# Patient Record
Sex: Female | Born: 1965
Health system: Southern US, Community
[De-identification: ages and names within clinical notes are randomized; demographics above are authoritative.]

## PROBLEM LIST (undated history)

## (undated) DIAGNOSIS — I059 Rheumatic mitral valve disease, unspecified: Secondary | ICD-10-CM

## (undated) DIAGNOSIS — N760 Acute vaginitis: Secondary | ICD-10-CM

## (undated) DIAGNOSIS — K219 Gastro-esophageal reflux disease without esophagitis: Secondary | ICD-10-CM

## (undated) DIAGNOSIS — K317 Polyp of stomach and duodenum: Secondary | ICD-10-CM

## (undated) DIAGNOSIS — G589 Mononeuropathy, unspecified: Secondary | ICD-10-CM

## (undated) DIAGNOSIS — Z90710 Acquired absence of both cervix and uterus: Secondary | ICD-10-CM

## (undated) DIAGNOSIS — A159 Respiratory tuberculosis unspecified: Secondary | ICD-10-CM

## (undated) DIAGNOSIS — F419 Anxiety disorder, unspecified: Secondary | ICD-10-CM

## (undated) DIAGNOSIS — M549 Dorsalgia, unspecified: Secondary | ICD-10-CM

## (undated) DIAGNOSIS — R079 Chest pain, unspecified: Secondary | ICD-10-CM

## (undated) DIAGNOSIS — E785 Hyperlipidemia, unspecified: Secondary | ICD-10-CM

## (undated) DIAGNOSIS — R000 Tachycardia, unspecified: Secondary | ICD-10-CM

## (undated) DIAGNOSIS — D126 Benign neoplasm of colon, unspecified: Secondary | ICD-10-CM

## (undated) DIAGNOSIS — J019 Acute sinusitis, unspecified: Secondary | ICD-10-CM

## (undated) DIAGNOSIS — J189 Pneumonia, unspecified organism: Secondary | ICD-10-CM

## (undated) DIAGNOSIS — K589 Irritable bowel syndrome without diarrhea: Secondary | ICD-10-CM

## (undated) DIAGNOSIS — F329 Major depressive disorder, single episode, unspecified: Secondary | ICD-10-CM

## (undated) DIAGNOSIS — M479 Spondylosis, unspecified: Secondary | ICD-10-CM

## (undated) DIAGNOSIS — K2270 Barrett's esophagus without dysplasia: Secondary | ICD-10-CM

## (undated) DIAGNOSIS — N83201 Unspecified ovarian cyst, right side: Secondary | ICD-10-CM

## (undated) DIAGNOSIS — F32A Depression, unspecified: Secondary | ICD-10-CM

## (undated) HISTORY — DX: Acute sinusitis, unspecified: J01.90

## (undated) HISTORY — PX: ABDOMINAL HYSTERECTOMY: SHX81

## (undated) HISTORY — PX: TONSILLECTOMY: SUR1361

## (undated) HISTORY — DX: Rheumatic mitral valve disease, unspecified: I05.9

## (undated) HISTORY — DX: Irritable bowel syndrome without diarrhea: K58.9

## (undated) HISTORY — DX: Barrett's esophagus without dysplasia: K22.70

## (undated) HISTORY — DX: Chest pain, unspecified: R07.9

## (undated) HISTORY — DX: Unspecified ovarian cyst, right side: N83.201

## (undated) HISTORY — DX: Benign neoplasm of colon, unspecified: D12.6

## (undated) HISTORY — DX: Spondylosis, unspecified: M47.9

## (undated) HISTORY — DX: Polyp of stomach and duodenum: K31.7

## (undated) HISTORY — PX: CHOLECYSTECTOMY: SHX55

## (undated) HISTORY — PX: TUBAL LIGATION: SHX77

## (undated) HISTORY — DX: Dorsalgia, unspecified: M54.9

## (undated) HISTORY — DX: Tachycardia, unspecified: R00.0

## (undated) HISTORY — DX: Gastro-esophageal reflux disease without esophagitis: K21.9

## (undated) HISTORY — DX: Hyperlipidemia, unspecified: E78.5

## (undated) HISTORY — DX: Acute vaginitis: N76.0

## (undated) HISTORY — PX: OTHER SURGICAL HISTORY: SHX169

---

## 1997-12-20 ENCOUNTER — Other Ambulatory Visit: Admission: RE | Admit: 1997-12-20 | Discharge: 1997-12-20 | Payer: Self-pay | Admitting: Family Medicine

## 2000-06-12 ENCOUNTER — Encounter: Admission: RE | Admit: 2000-06-12 | Discharge: 2000-06-12 | Payer: Self-pay | Admitting: Obstetrics

## 2000-06-12 ENCOUNTER — Encounter: Payer: Self-pay | Admitting: Obstetrics

## 2000-06-27 ENCOUNTER — Emergency Department (HOSPITAL_COMMUNITY): Admission: EM | Admit: 2000-06-27 | Discharge: 2000-06-27 | Payer: Self-pay | Admitting: Emergency Medicine

## 2000-09-05 ENCOUNTER — Encounter: Payer: Self-pay | Admitting: Emergency Medicine

## 2000-09-05 ENCOUNTER — Emergency Department (HOSPITAL_COMMUNITY): Admission: EM | Admit: 2000-09-05 | Discharge: 2000-09-05 | Payer: Self-pay | Admitting: Emergency Medicine

## 2005-06-25 LAB — HM MAMMOGRAPHY: HM Mammogram: NORMAL

## 2006-06-11 ENCOUNTER — Ambulatory Visit: Payer: Self-pay | Admitting: Internal Medicine

## 2006-06-11 LAB — CONVERTED CEMR LAB
ALT: 28 units/L (ref 0–40)
AST: 20 units/L (ref 0–37)
Albumin: 3.9 g/dL (ref 3.5–5.2)
Alkaline Phosphatase: 47 units/L (ref 39–117)
BUN: 10 mg/dL (ref 6–23)
Basophils Absolute: 0 10*3/uL (ref 0.0–0.1)
Basophils Relative: 0 % (ref 0.0–1.0)
Bilirubin Urine: NEGATIVE
CO2: 26 meq/L (ref 19–32)
Calcium: 9 mg/dL (ref 8.4–10.5)
Chloride: 108 meq/L (ref 96–112)
Chol/HDL Ratio, serum: 6.7
Cholesterol: 225 mg/dL (ref 0–200)
Creatinine, Ser: 1.1 mg/dL (ref 0.4–1.2)
Eosinophil percent: 1.6 % (ref 0.0–5.0)
GFR calc non Af Amer: 58 mL/min
Glomerular Filtration Rate, Af Am: 70 mL/min/{1.73_m2}
Glucose, Bld: 97 mg/dL (ref 70–99)
HCT: 45.8 % (ref 36.0–46.0)
HDL: 33.6 mg/dL — ABNORMAL LOW (ref >39.0)
Hemoglobin: 15.4 g/dL — ABNORMAL HIGH (ref 12.0–15.0)
Ketones, ur: NEGATIVE mg/dL
LDL DIRECT: 171.9 mg/dL
Leukocytes, UA: NEGATIVE
Lymphocytes Relative: 32.8 % (ref 12.0–46.0)
MCHC: 33.6 g/dL (ref 30.0–36.0)
MCV: 88.2 fL (ref 78.0–100.0)
Monocytes Absolute: 0.7 10*3/uL (ref 0.2–0.7)
Monocytes Relative: 7 % (ref 3.0–11.0)
Neutro Abs: 5.9 10*3/uL (ref 1.4–7.7)
Neutrophils Relative %: 58.6 % (ref 43.0–77.0)
Nitrite: NEGATIVE
Platelets: 314 10*3/uL (ref 150–400)
Potassium: 4 meq/L (ref 3.5–5.1)
RBC: 5.19 M/uL — ABNORMAL HIGH (ref 3.87–5.11)
RDW: 12.1 % (ref 11.5–14.6)
Sodium: 139 meq/L (ref 135–145)
Specific Gravity, Urine: 1.015 (ref 1.000–1.03)
TSH: 1.69 microintl units/mL (ref 0.35–5.50)
Total Bilirubin: 0.7 mg/dL (ref 0.3–1.2)
Total Protein, Urine: NEGATIVE mg/dL
Total Protein: 7.1 g/dL (ref 6.0–8.3)
Triglyceride fasting, serum: 79 mg/dL (ref 0–149)
Urine Glucose: NEGATIVE mg/dL
Urobilinogen, UA: 2 (ref 0.0–1.0)
VLDL: 16 mg/dL (ref 0–40)
WBC: 10.1 10*3/uL (ref 4.5–10.5)
pH: 7.5 (ref 5.0–8.0)

## 2006-07-31 ENCOUNTER — Ambulatory Visit: Payer: Self-pay | Admitting: Internal Medicine

## 2006-07-31 LAB — CONVERTED CEMR LAB
ALT: 26 units/L (ref 0–40)
Bilirubin, Direct: 0.2 mg/dL (ref 0.0–0.3)
Chol/HDL Ratio, serum: 3.7
Cholesterol: 134 mg/dL (ref 0–200)
HDL: 36 mg/dL — ABNORMAL LOW (ref >39.0)
LDL Cholesterol: 81 mg/dL (ref 0–99)
Total CK: 115 units/L (ref 7–177)
Total Protein: 7.1 g/dL (ref 6.0–8.3)
VLDL: 17 mg/dL (ref 0–40)

## 2006-09-25 ENCOUNTER — Emergency Department (HOSPITAL_COMMUNITY): Admission: EM | Admit: 2006-09-25 | Discharge: 2006-09-25 | Payer: Self-pay | Admitting: Emergency Medicine

## 2006-10-27 ENCOUNTER — Ambulatory Visit (HOSPITAL_COMMUNITY): Admission: RE | Admit: 2006-10-27 | Discharge: 2006-10-27 | Payer: Self-pay | Admitting: Gastroenterology

## 2007-07-02 ENCOUNTER — Ambulatory Visit: Payer: Self-pay | Admitting: Internal Medicine

## 2007-07-05 LAB — CONVERTED CEMR LAB
ALT: 23 units/L (ref 0–35)
AST: 22 units/L (ref 0–37)
Alkaline Phosphatase: 49 units/L (ref 39–117)
BUN: 8 mg/dL (ref 6–23)
Basophils Absolute: 0.1 10*3/uL (ref 0.0–0.1)
Bilirubin Urine: NEGATIVE
Bilirubin, Direct: 0.2 mg/dL (ref 0.0–0.3)
Calcium: 9.5 mg/dL (ref 8.4–10.5)
Chloride: 106 meq/L (ref 96–112)
Cholesterol: 150 mg/dL (ref 0–200)
Crystals: NEGATIVE
GFR calc non Af Amer: 73 mL/min
Glucose, Bld: 91 mg/dL (ref 70–99)
HCT: 46.4 % — ABNORMAL HIGH (ref 36.0–46.0)
HDL: 35.7 mg/dL — ABNORMAL LOW (ref >39.0)
Hemoglobin: 15.8 g/dL — ABNORMAL HIGH (ref 12.0–15.0)
Leukocytes, UA: NEGATIVE
Lymphocytes Relative: 30.1 % (ref 12.0–46.0)
MCHC: 34.2 g/dL (ref 30.0–36.0)
MCV: 89.8 fL (ref 78.0–100.0)
Monocytes Absolute: 1 10*3/uL — ABNORMAL HIGH (ref 0.2–0.7)
Monocytes Relative: 8.4 % (ref 3.0–11.0)
Mucus, UA: NEGATIVE
Neutro Abs: 6.8 10*3/uL (ref 1.4–7.7)
Neutrophils Relative %: 60.2 % (ref 43.0–77.0)
RDW: 11.9 % (ref 11.5–14.6)
Triglycerides: 95 mg/dL (ref 0–149)
Urobilinogen, UA: 4 — AB (ref 0.0–1.0)
pH: 7 (ref 5.0–8.0)

## 2007-07-22 DIAGNOSIS — E785 Hyperlipidemia, unspecified: Secondary | ICD-10-CM

## 2007-07-22 DIAGNOSIS — I059 Rheumatic mitral valve disease, unspecified: Secondary | ICD-10-CM | POA: Insufficient documentation

## 2007-07-22 DIAGNOSIS — K219 Gastro-esophageal reflux disease without esophagitis: Secondary | ICD-10-CM | POA: Insufficient documentation

## 2007-07-22 HISTORY — DX: Rheumatic mitral valve disease, unspecified: I05.9

## 2007-07-22 HISTORY — DX: Hyperlipidemia, unspecified: E78.5

## 2007-07-22 HISTORY — DX: Gastro-esophageal reflux disease without esophagitis: K21.9

## 2007-07-23 ENCOUNTER — Ambulatory Visit: Payer: Self-pay | Admitting: Internal Medicine

## 2007-07-23 DIAGNOSIS — M479 Spondylosis, unspecified: Secondary | ICD-10-CM

## 2007-07-23 DIAGNOSIS — K589 Irritable bowel syndrome without diarrhea: Secondary | ICD-10-CM | POA: Insufficient documentation

## 2007-07-23 DIAGNOSIS — M549 Dorsalgia, unspecified: Secondary | ICD-10-CM | POA: Insufficient documentation

## 2007-07-23 HISTORY — DX: Dorsalgia, unspecified: M54.9

## 2007-07-23 HISTORY — DX: Spondylosis, unspecified: M47.9

## 2007-07-23 HISTORY — DX: Irritable bowel syndrome, unspecified: K58.9

## 2007-08-27 ENCOUNTER — Telehealth (INDEPENDENT_AMBULATORY_CARE_PROVIDER_SITE_OTHER): Payer: Self-pay | Admitting: *Deleted

## 2007-09-09 ENCOUNTER — Ambulatory Visit: Payer: Self-pay | Admitting: Internal Medicine

## 2007-09-09 DIAGNOSIS — J019 Acute sinusitis, unspecified: Secondary | ICD-10-CM

## 2007-09-09 HISTORY — DX: Acute sinusitis, unspecified: J01.90

## 2007-09-13 ENCOUNTER — Telehealth (INDEPENDENT_AMBULATORY_CARE_PROVIDER_SITE_OTHER): Payer: Self-pay | Admitting: *Deleted

## 2007-10-25 LAB — CONVERTED CEMR LAB: Pap Smear: NORMAL

## 2007-10-28 ENCOUNTER — Ambulatory Visit: Payer: Self-pay | Admitting: Internal Medicine

## 2007-12-17 ENCOUNTER — Ambulatory Visit: Payer: Self-pay | Admitting: Internal Medicine

## 2007-12-17 DIAGNOSIS — N760 Acute vaginitis: Secondary | ICD-10-CM

## 2007-12-17 HISTORY — DX: Acute vaginitis: N76.0

## 2007-12-28 ENCOUNTER — Telehealth (INDEPENDENT_AMBULATORY_CARE_PROVIDER_SITE_OTHER): Payer: Self-pay | Admitting: *Deleted

## 2008-01-11 ENCOUNTER — Telehealth (INDEPENDENT_AMBULATORY_CARE_PROVIDER_SITE_OTHER): Payer: Self-pay | Admitting: *Deleted

## 2008-01-31 ENCOUNTER — Ambulatory Visit (HOSPITAL_COMMUNITY): Admission: RE | Admit: 2008-01-31 | Discharge: 2008-01-31 | Payer: Self-pay | Admitting: Obstetrics and Gynecology

## 2008-07-18 ENCOUNTER — Ambulatory Visit: Payer: Self-pay | Admitting: Internal Medicine

## 2008-07-18 LAB — CONVERTED CEMR LAB
ALT: 19 units/L (ref 0–35)
Alkaline Phosphatase: 42 units/L (ref 39–117)
Basophils Absolute: 0.1 10*3/uL (ref 0.0–0.1)
Bilirubin Urine: NEGATIVE
Bilirubin, Direct: 0.1 mg/dL (ref 0.0–0.3)
CO2: 29 meq/L (ref 19–32)
Calcium: 9.4 mg/dL (ref 8.4–10.5)
Cholesterol: 212 mg/dL (ref 0–200)
GFR calc Af Amer: 117 mL/min
Glucose, Bld: 88 mg/dL (ref 70–99)
HDL: 41.1 mg/dL (ref >39.0)
Hemoglobin, Urine: NEGATIVE
Ketones, ur: NEGATIVE mg/dL
Leukocytes, UA: NEGATIVE
Lymphocytes Relative: 36 % (ref 12.0–46.0)
MCHC: 35.4 g/dL (ref 30.0–36.0)
Monocytes Relative: 9 % (ref 3.0–12.0)
Neutro Abs: 5 10*3/uL (ref 1.4–7.7)
Nitrite: NEGATIVE
Platelets: 264 10*3/uL (ref 150–400)
Potassium: 4 meq/L (ref 3.5–5.1)
RDW: 11.6 % (ref 11.5–14.6)
Sodium: 139 meq/L (ref 135–145)
TSH: 1.85 microintl units/mL (ref 0.35–5.50)
Total Bilirubin: 1 mg/dL (ref 0.3–1.2)
Total CHOL/HDL Ratio: 5.2
Total Protein: 7.1 g/dL (ref 6.0–8.3)
Triglycerides: 114 mg/dL (ref 0–149)
Urobilinogen, UA: 0.2 (ref 0.0–1.0)
VLDL: 23 mg/dL (ref 0–40)
pH: 7.5 (ref 5.0–8.0)

## 2008-07-25 ENCOUNTER — Ambulatory Visit: Payer: Self-pay | Admitting: Internal Medicine

## 2008-07-28 ENCOUNTER — Telehealth (INDEPENDENT_AMBULATORY_CARE_PROVIDER_SITE_OTHER): Payer: Self-pay | Admitting: *Deleted

## 2008-12-14 ENCOUNTER — Ambulatory Visit: Payer: Self-pay | Admitting: Internal Medicine

## 2008-12-14 LAB — CONVERTED CEMR LAB
ALT: 20 units/L (ref 0–35)
AST: 21 units/L (ref 0–37)
Albumin: 3.8 g/dL (ref 3.5–5.2)
HDL: 35.9 mg/dL — ABNORMAL LOW (ref >39.00)
Total Bilirubin: 0.7 mg/dL (ref 0.3–1.2)
Triglycerides: 66 mg/dL (ref 0.0–149.0)
VLDL: 13.2 mg/dL (ref 0.0–40.0)

## 2009-01-25 ENCOUNTER — Ambulatory Visit: Payer: Self-pay | Admitting: Internal Medicine

## 2009-07-23 ENCOUNTER — Ambulatory Visit: Payer: Self-pay | Admitting: Internal Medicine

## 2009-07-23 LAB — CONVERTED CEMR LAB
ALT: 19 units/L (ref 0–35)
Bilirubin Urine: NEGATIVE
Calcium: 9.2 mg/dL (ref 8.4–10.5)
Cholesterol: 142 mg/dL (ref 0–200)
Eosinophils Absolute: 0.2 10*3/uL (ref 0.0–0.7)
Eosinophils Relative: 2 % (ref 0.0–5.0)
GFR calc non Af Amer: 82.76 mL/min (ref >60)
HCT: 42 % (ref 36.0–46.0)
HDL: 39 mg/dL — ABNORMAL LOW (ref >39.00)
Leukocytes, UA: NEGATIVE
Lymphs Abs: 3.2 10*3/uL (ref 0.7–4.0)
MCHC: 34 g/dL (ref 30.0–36.0)
MCV: 92 fL (ref 78.0–100.0)
Monocytes Absolute: 0.8 10*3/uL (ref 0.1–1.0)
Neutrophils Relative %: 56.6 % (ref 43.0–77.0)
Nitrite: NEGATIVE
Platelets: 231 10*3/uL (ref 150.0–400.0)
Potassium: 4.1 meq/L (ref 3.5–5.1)
RDW: 12.1 % (ref 11.5–14.6)
Sodium: 139 meq/L (ref 135–145)
TSH: 2.24 microintl units/mL (ref 0.35–5.50)
Total Bilirubin: 0.9 mg/dL (ref 0.3–1.2)
Triglycerides: 82 mg/dL (ref 0.0–149.0)
VLDL: 16.4 mg/dL (ref 0.0–40.0)
pH: 6.5 (ref 5.0–8.0)

## 2009-08-02 ENCOUNTER — Ambulatory Visit: Payer: Self-pay | Admitting: Internal Medicine

## 2010-08-20 ENCOUNTER — Telehealth: Payer: Self-pay | Admitting: Internal Medicine

## 2010-08-20 DIAGNOSIS — R000 Tachycardia, unspecified: Secondary | ICD-10-CM | POA: Insufficient documentation

## 2010-08-20 HISTORY — DX: Tachycardia, unspecified: R00.0

## 2010-08-21 ENCOUNTER — Encounter (INDEPENDENT_AMBULATORY_CARE_PROVIDER_SITE_OTHER): Payer: Self-pay | Admitting: *Deleted

## 2010-09-04 ENCOUNTER — Ambulatory Visit
Admission: RE | Admit: 2010-09-04 | Discharge: 2010-09-04 | Payer: Self-pay | Source: Home / Self Care | Attending: Cardiology | Admitting: Cardiology

## 2010-09-04 ENCOUNTER — Ambulatory Visit: Admission: RE | Admit: 2010-09-04 | Discharge: 2010-09-04 | Payer: Self-pay | Source: Home / Self Care

## 2010-09-04 ENCOUNTER — Encounter: Payer: Self-pay | Admitting: Cardiology

## 2010-09-04 DIAGNOSIS — R079 Chest pain, unspecified: Secondary | ICD-10-CM | POA: Insufficient documentation

## 2010-09-04 DIAGNOSIS — R002 Palpitations: Secondary | ICD-10-CM

## 2010-09-04 HISTORY — DX: Chest pain, unspecified: R07.9

## 2010-09-23 ENCOUNTER — Ambulatory Visit: Admission: RE | Admit: 2010-09-23 | Discharge: 2010-09-23 | Payer: Self-pay | Source: Home / Self Care

## 2010-09-23 ENCOUNTER — Ambulatory Visit (HOSPITAL_COMMUNITY)
Admission: RE | Admit: 2010-09-23 | Discharge: 2010-09-23 | Payer: Self-pay | Source: Home / Self Care | Attending: Cardiology | Admitting: Cardiology

## 2010-09-23 ENCOUNTER — Ambulatory Visit
Admission: RE | Admit: 2010-09-23 | Discharge: 2010-09-23 | Payer: Self-pay | Source: Home / Self Care | Attending: Cardiology | Admitting: Cardiology

## 2010-09-24 NOTE — Assessment & Plan Note (Signed)
Summary: FU-$50-STC   Vital Signs:  Patient Profile:   45 Years Old Female Weight:      201 pounds Temp:     99.8 degrees F oral Pulse rate:   75 / minute BP sitting:   121 / 82  (right arm) Cuff size:   regular  Pt. in pain?   no  Vitals Entered By: Maris Berger (December 17, 2007 10:26 AM)                  Chief Complaint:  keeps getting yeast infections.  History of Present Illness: had last pap with recurrent yeast infections lab proven, HIV neg, now with recurrent infections - she wants to know if there is different tx other than single diflucan pills; d/c is different this time - more clearish but "sticky" and fishy smell    Updated Prior Medication List: SIMVASTATIN 40 MG TABS (SIMVASTATIN) Take 1 tablet by mouth once a day OMEPRAZOLE 20 MG  CPDR (OMEPRAZOLE) 1 by mouth qd ECOTRIN LOW STRENGTH 81 MG  TBEC (ASPIRIN) 1 by mouth qd VICODIN 5-500 MG  TABS (HYDROCODONE-ACETAMINOPHEN) q4h as needed pain METRONIDAZOLE 250 MG  TABS (METRONIDAZOLE) 1 by mouth qid  Current Allergies (reviewed today): ! PENICILLIN ! TETRACYCLINE  Past Medical History:    Reviewed history from 07/23/2007 and no changes required:       GERD       Hyperlipidemia       mitral valve prolapse       IBS       gastric polyp x3 - benign - dr Randa Evens?GI - egd 6/08       Lumbar djd  Past Surgical History:    Reviewed history from 07/22/2007 and no changes required:       Hysterectomy       Tubal ligation       Cholecystectomy       Tonsillectomy       s/p RLE tib/fib fx/surgury   Social History:    Reviewed history from 07/22/2007 and no changes required:       Never Smoked       Alcohol use-no     Physical Exam  General:     Well-developed,well-nourished,in no acute distress; alert,appropriate and cooperative throughout examination Head:     Normocephalic and atraumatic without obvious abnormalities. No apparent alopecia or balding. Eyes:     No corneal or conjunctival  inflammation noted. EOMI. Perrla. Ears:     External ear exam shows no significant lesions or deformities.  Otoscopic examination reveals clear canals, tympanic membranes are intact bilaterally without bulging, retraction, inflammation or discharge. Hearing is grossly normal bilaterally. Nose:     External nasal examination shows no deformity or inflammation. Nasal mucosa are pink and moist without lesions or exudates. Mouth:     Oral mucosa and oropharynx without lesions or exudates.  Teeth in good repair. Neck:     No deformities, masses, or tenderness noted. Lungs:     Normal respiratory effort, chest expands symmetrically. Lungs are clear to auscultation, no crackles or wheezes. Heart:     Normal rate and regular rhythm. S1 and S2 normal without gallop, murmur, click, rub or other extra sounds. Genitalia:     deferred per pt Extremities:     no edema, no ulcers     Impression & Recommendations:  Problem # 1:  VAGINITIS (ICD-616.10)  The following medications were removed from the medication list:    Azithromycin 250 Mg  Tabs (Azithromycin) .Marland Kitchen... 2po qd for 1 day, then 1po qd for 4days, then stop  Her updated medication list for this problem includes:    Metronidazole 250 Mg Tabs (Metronidazole) .Marland Kitchen... 1 by mouth qid tx with above for suspected bact vaginosis, consider nizoral extended course if not improved in 5 to 7 days  Complete Medication List: 1)  Simvastatin 40 Mg Tabs (Simvastatin) .... Take 1 tablet by mouth once a day 2)  Omeprazole 20 Mg Cpdr (Omeprazole) .Marland Kitchen.. 1 by mouth qd 3)  Ecotrin Low Strength 81 Mg Tbec (Aspirin) .Marland Kitchen.. 1 by mouth qd 4)  Vicodin 5-500 Mg Tabs (Hydrocodone-acetaminophen) .... Q4h as needed pain 5)  Metronidazole 250 Mg Tabs (Metronidazole) .Marland Kitchen.. 1 by mouth qid   Patient Instructions: 1)  try the generic flagyl  for one week 2)  if not improved, call and we will consider extended course nizoral for yeast infection 3)  you can also use Eucerin  cream (OTC) or lac-hydrin OTC for dry skin 4)  Please schedule a follow-up appointment as needed.    Prescriptions: METRONIDAZOLE 250 MG  TABS (METRONIDAZOLE) 1 by mouth qid  #40 x 0   Entered and Authorized by:   Corwin Levins MD   Signed by:   Corwin Levins MD on 12/17/2007   Method used:   Print then Give to Patient   RxID:   [id number]  ]

## 2010-09-25 ENCOUNTER — Telehealth: Payer: Self-pay | Admitting: Cardiology

## 2010-09-26 NOTE — Letter (Signed)
Summary: [facility] Consult Scheduled Letter  [facility]  [address] [place], [place] [address]   Phone: [phone]  Fax: [phone]      08/21/2010 MRN: [id number]  Pamela Gibson [address] [place], [place]  [address]    Dear Ms. Snavely,      We have scheduled an appointment for you. At the recommendation of Dr.John, we have scheduled you a consult with [facility])Dr.Hochrein on January 11,2012 at 11:00am .Their phone number is [phone].  If this appointment day and time is not convenient for you, please feel free to call the office of the doctor you are being referred to at the number listed above and reschedule the appointment.  [facility] 3rd floor [address] [place] [address]  Thank you,  Patient Care Coordinator [facility]

## 2010-09-26 NOTE — Progress Notes (Signed)
Summary: referral  Phone Note Call from Patient   Caller: Patient [phone] Summary of Call: Pt called requesting referral to Cardiology, Hx of Mitral Valve Prolapse Initial call taken by: Margaret Pyle, CMA,  August 20, 2010 2:21 PM  Follow-up for Phone Call        this does not normally require referral to cardiology;  is there some symptoms or problem she is having?  might need to consider OV Follow-up by: Corwin Levins MD,  August 20, 2010 3:23 PM  Additional Follow-up for Phone Call Additional follow up Details #1::        Pt states she was seen at [facility] for tachycardia and advised to have eval at Cardiology but referral needed to come from [facility]. Additional Follow-up by: Margaret Pyle, CMA,  August 20, 2010 4:38 PM  New Problems: TACHYCARDIA (ICD-785.0)   Additional Follow-up for Phone Call Additional follow up Details #2::    ok for referral - done per emr Follow-up by: Corwin Levins MD,  August 20, 2010 5:10 PM  Additional Follow-up for Phone Call Additional follow up Details #3:: Details for Additional Follow-up Action Taken: Pt informed  Additional Follow-up by: Lamar Sprinkles, CMA,  August 20, 2010 5:57 PM  New Problems: TACHYCARDIA (ICD-785.0)

## 2010-09-26 NOTE — Assessment & Plan Note (Signed)
Summary: np6. tackycardia. pt has bcbs. gd   Visit Type:  Initial Consult Primary Provider:  Corwin Levins MD  CC:  Palpitatoins and Chest pain.  History of Present Illness: The patient presents for evaluation of the above. She has been having tachycardia palpitations happen several weeks. It seems to happen every other day. It may happen a couple of times per day. She feels her heart racing for several seconds at a time. She cannot bring this on. She doesn't exercise routinely though she does her activities of daily living without symptoms. She denies presyncope or syncope. She denies any shortness of breath, PND or orthopnea. Of note she was told she had mitral valve prolapse in the past but has not had echo in greater than 20 years. She does describe some chest heaviness. This happens randomly. She did not describe associated jaw or arm discomfort. She does not have associated symptoms such as nausea vomiting or diaphoresis. She did have arm discomfort some years ago and reports a negative exercise treadmill test.  Current Medications (verified): 1)  Pravachol 40 Mg Tabs (Pravastatin Sodium) .Marland Kitchen.. 1po Once Daily 2)  Omeprazole 20 Mg  Cpdr (Omeprazole) .Marland Kitchen.. 1 By Mouth Once Daily 3)  Ecotrin Low Strength 81 Mg  Tbec (Aspirin) .Marland Kitchen.. 1 By Mouth Qd 4)  Vicodin 5-500 Mg  Tabs (Hydrocodone-Acetaminophen) .... Q4h As Needed Pain  Allergies (verified): 1)  ! Penicillin 2)  ! 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 Otherwise as stated in the HPI and negative for all other systems.  Vital Signs:  Patient profile:   45 year old female Height:      66 inches Weight:      198 pounds BMI:     32.07 Pulse rate:   75 / minute Resp:     16 per minute BP sitting:   124 / 82  (right arm)  Vitals Entered By: Marrion Coy, CNA (September 04, 2010 11:03 AM)  Physical Exam  General:  Well developed, well nourished, in no acute distress. Head:  normocephalic and atraumatic Eyes:  PERRLA/EOM intact; conjunctiva and lids normal. Mouth:  Teeth, gums and palate normal. Oral mucosa normal. Neck:  Neck supple, no JVD. No masses, thyromegaly or abnormal cervical nodes. Chest Wall:  no deformities or breast masses noted Lungs:  Clear bilaterally to auscultation and percussion. Abdomen:  Bowel sounds positive; abdomen soft and non-tender without masses, organomegaly, or hernias noted. No hepatosplenomegaly. Msk:  Back normal, normal gait. Muscle strength and tone normal. Extremities:  No clubbing or cyanosis. Neurologic:  Alert and oriented x 3. Skin:  Intact without lesions or rashes. Cervical Nodes:  no significant adenopathy Axillary Nodes:  no significant adenopathy Inguinal Nodes:  no significant adenopathy Psych:  Normal affect.   Detailed Cardiovascular Exam  Neck    Carotids: Carotids full and equal bilaterally without bruits.      Neck Veins: Normal, no JVD.    Heart  Inspection: no deformities or lifts noted.      Palpation: normal PMI with no thrills palpable.      Auscultation: regular rate and rhythm, S1, S2 without murmurs, rubs, gallops.  Soft midsystolic click.  Vascular    Abdominal Aorta: no palpable masses, pulsations, or audible bruits.      Femoral Pulses: normal femoral pulses bilaterally.      Pedal Pulses: normal pedal pulses bilaterally.      Radial Pulses: normal radial pulses  bilaterally.      Peripheral Circulation: no clubbing, cyanosis, or edema noted with normal capillary refill.     EKG  Procedure date:  09/04/2010  Findings:      Sinus rhythm, rate 76, right axis deviation, no acute ST-T wave changes  Impression & Recommendations:  Problem # 1:  TACHYCARDIA (ICD-785.0) Predominant complaint is palpitations. I will have the patient wear a 21 day event monitor.   Orders: EKG w/ Interpretation (93000) Event (Event)  Problem # 2:  CHEST PAIN (ICD-786.50) Patient describes chest pressure intermittent. Her pretest probability is somewhat low. However screening with exercise treadmill testing as indicated. This will also allow me to risk stratify and prescribe exercise. Orders: Treadmill (Treadmill)  Problem # 3:  MITRAL VALVE PROLAPSE (ICD-424.0) I will followup with an echocardiogram to exclude significant prolapse or regurgitation requiring further followup for management. Orders: Echocardiogram (Echo)  Patient Instructions: 1)  Your physician recommends that you continue on your current medications as directed. Please refer to the Current Medication list given to you today. 2)  Your physician has requested that you have an echocardiogram.  Echocardiography is a painless test that uses sound waves to create images of your heart. It provides your doctor with information about the size and shape of your heart and how well your heart's chambers and valves are working.  This procedure takes approximately one hour. There are no restrictions for this procedure. 3)  Your physician has recommended that you wear an event monitor.  Event monitors are medical devices that record the heart's electrical activity. Doctors most often use these monitors to diagnose arrhythmias. Arrhythmias are problems with the speed or rhythm of the heartbeat. The monitor is a small, portable device. You can wear one while you do your normal daily activities. This is usually used to  diagnose what is causing palpitations/syncope (passing out). 4)  Your physician has requested that you have an exercise tolerance test.  For further information please visit [email or web address]  Please also follow instruction sheet, as given.

## 2010-10-02 NOTE — Progress Notes (Signed)
Summary: Echo results  Phone Note Call from Patient Call back at [phone]   Summary of Call: Echo results Initial call taken by: Judie Grieve,  September 25, 2010 4:03 PM  Follow-up for Phone Call        Explained to patient that Dr. Antoine Poche has not yet reviewed her echo results & we would call her as soon as he does. Whitney Maeola Sarah RN  September 25, 2010 4:09 PM  Follow-up by: Whitney Maeola Sarah RN,  September 25, 2010 4:09 PM  Additional Follow-up for Phone Call Additional follow up Details #1::        Echo normal. Additional Follow-up by: Rollene Rotunda, MD, [facility],  September 26, 2010 10:29 AM    Additional Follow-up for Phone Call Additional follow up Details #2::    pt aware of normal results Follow-up by: Charolotte Capuchin, RN,  September 26, 2010 10:45 AM

## 2010-12-03 ENCOUNTER — Other Ambulatory Visit: Payer: Self-pay | Admitting: Internal Medicine

## 2010-12-03 ENCOUNTER — Other Ambulatory Visit (INDEPENDENT_AMBULATORY_CARE_PROVIDER_SITE_OTHER): Payer: BC Managed Care – PPO

## 2010-12-03 ENCOUNTER — Other Ambulatory Visit (INDEPENDENT_AMBULATORY_CARE_PROVIDER_SITE_OTHER): Payer: BC Managed Care – PPO | Admitting: Internal Medicine

## 2010-12-03 DIAGNOSIS — Z0000 Encounter for general adult medical examination without abnormal findings: Secondary | ICD-10-CM

## 2010-12-03 LAB — URINALYSIS, ROUTINE W REFLEX MICROSCOPIC
Ketones, ur: NEGATIVE
Specific Gravity, Urine: 1.01 (ref 1.000–1.030)
Total Protein, Urine: NEGATIVE
Urine Glucose: NEGATIVE
pH: 6 (ref 5.0–8.0)

## 2010-12-03 LAB — HEPATIC FUNCTION PANEL
ALT: 28 U/L (ref 0–35)
AST: 22 U/L (ref 0–37)
Albumin: 4 g/dL (ref 3.5–5.2)
Alkaline Phosphatase: 44 U/L (ref 39–117)
Total Protein: 7 g/dL (ref 6.0–8.3)

## 2010-12-03 LAB — CBC WITH DIFFERENTIAL/PLATELET
Basophils Absolute: 0 10*3/uL (ref 0.0–0.1)
Eosinophils Absolute: 0.2 10*3/uL (ref 0.0–0.7)
Lymphocytes Relative: 33.6 % (ref 12.0–46.0)
MCHC: 34.9 g/dL (ref 30.0–36.0)
Neutro Abs: 6.3 10*3/uL (ref 1.4–7.7)
Neutrophils Relative %: 56.4 % (ref 43.0–77.0)
Platelets: 272 10*3/uL (ref 150.0–400.0)
RDW: 13.2 % (ref 11.5–14.6)

## 2010-12-03 LAB — BASIC METABOLIC PANEL
Calcium: 9.4 mg/dL (ref 8.4–10.5)
Chloride: 103 mEq/L (ref 96–112)
Creatinine, Ser: 0.9 mg/dL (ref 0.4–1.2)
Sodium: 139 mEq/L (ref 135–145)

## 2010-12-03 LAB — LIPID PANEL
Cholesterol: 177 mg/dL (ref 0–200)
Triglycerides: 153 mg/dL — ABNORMAL HIGH (ref 0.0–149.0)

## 2010-12-08 ENCOUNTER — Encounter: Payer: Self-pay | Admitting: Internal Medicine

## 2010-12-08 DIAGNOSIS — Z0000 Encounter for general adult medical examination without abnormal findings: Secondary | ICD-10-CM

## 2010-12-08 DIAGNOSIS — Z0001 Encounter for general adult medical examination with abnormal findings: Secondary | ICD-10-CM | POA: Insufficient documentation

## 2010-12-11 ENCOUNTER — Ambulatory Visit (INDEPENDENT_AMBULATORY_CARE_PROVIDER_SITE_OTHER): Payer: BC Managed Care – PPO | Admitting: Internal Medicine

## 2010-12-11 ENCOUNTER — Encounter: Payer: Self-pay | Admitting: Internal Medicine

## 2010-12-11 VITALS — BP 102/60 | HR 72 | Temp 98.9°F | Ht 65.0 in | Wt 203.0 lb

## 2010-12-11 DIAGNOSIS — Z0000 Encounter for general adult medical examination without abnormal findings: Secondary | ICD-10-CM

## 2010-12-11 DIAGNOSIS — D126 Benign neoplasm of colon, unspecified: Secondary | ICD-10-CM | POA: Insufficient documentation

## 2010-12-11 DIAGNOSIS — M549 Dorsalgia, unspecified: Secondary | ICD-10-CM

## 2010-12-11 DIAGNOSIS — K317 Polyp of stomach and duodenum: Secondary | ICD-10-CM | POA: Insufficient documentation

## 2010-12-11 MED ORDER — PRAVASTATIN SODIUM 40 MG PO TABS: 40.0000 mg | ORAL_TABLET | Freq: Every day | ORAL | Status: DC

## 2010-12-11 MED ORDER — SUCRALFATE 1 GM/10ML PO SUSP: 1.0000 g | Freq: Two times a day (BID) | ORAL | Status: DC

## 2010-12-11 MED ORDER — HYDROCODONE-ACETAMINOPHEN 5-500 MG PO TABS: 1.0000 | ORAL_TABLET | Freq: Two times a day (BID) | ORAL | Status: DC | PRN

## 2010-12-11 MED ORDER — OMEPRAZOLE 40 MG PO CPDR: 40.0000 mg | DELAYED_RELEASE_CAPSULE | Freq: Every day | ORAL | Status: DC

## 2010-12-11 NOTE — Progress Notes (Signed)
Subjective:    Patient ID: Pamela Gibson, female    DOB: 18-Sep-1965, 45 y.o.   MRN: [id number]  HPI Here for wellness and f/u;  Overall doing ok;  Pt denies CP, worsening SOB, DOE, wheezing, orthopnea, PND, worsening LE edema, palpitations, dizziness or syncope.  Pt denies neurological change such as new Headache, facial or extremity weakness.  Pt denies polydipsia, polyuria, or low sugar symptoms. Pt states overall good compliance with treatment and medications, good tolerability, and trying to follow lower cholesterol diet.  Pt denies worsening depressive symptoms, suicidal ideation or panic. No fever, wt loss, night sweats, loss of appetite, or other constitutional symptoms.  Pt states good ability with ADL's, low fall risk, home safety reviewed and adequate, no significant changes in hearing or vision, and occasionally active with exercise.  Did have recent echo and neg stress test per card recently; has not heard about holter test. Diet has been a bit more liberal with chol , and has gained a few lbs in the past 1-2 yrs. Pt continues to have recurring LBP without change in severity, bowel or bladder change, fever, wt loss,  worsening LE pain/numbness/weakness, gait change or falls.  Past Medical History  Diagnosis Date  . HYPERLIPIDEMIA 07/22/2007  . MITRAL VALVE PROLAPSE 07/22/2007  . SINUSITIS- ACUTE-NOS 09/09/2007  . GERD 07/22/2007  . IBS 07/23/2007  . Unspecified Vaginitis and Vulvovaginitis 12/17/2007  . DEGENERATIVE JOINT DISEASE, LUMBAR SPINE 07/23/2007  . BACK PAIN 07/23/2007  . TACHYCARDIA 08/20/2010  . CHEST PAIN 09/04/2010   Past Surgical History  Procedure Date  . Abdominal hysterectomy   . Tubal ligation   . Cholecystectomy   . Tonsillectomy   . Rle tib/fib fx/surgury     reports that she has never smoked. She does not have any smokeless tobacco history on file. She reports that she does not drink alcohol. Her drug history not on file. family history includes Angina in her  mother; Colon polyps in her mother; Diabetes in her other; Heart disease in her mother; and Hypertension in her other. Allergies  Allergen Reactions  . Atorvastatin     REACTION: more forgetful  . Penicillins     REACTION: turns blue  . Simvastatin     REACTION: more forgetful  . Tetracycline     REACTION: itch   Current Outpatient Prescriptions on File Prior to Visit  Medication Sig Dispense Refill  . aspirin 81 MG EC tablet Take 81 mg by mouth daily.        Marland Kitchen DISCONTD: HYDROcodone-acetaminophen (VICODIN) 5-500 MG per tablet Take 1 tablet by mouth every 4 (four) hours as needed.        Marland Kitchen DISCONTD: pravastatin (PRAVACHOL) 40 MG tablet Take 40 mg by mouth daily.        Marland Kitchen DISCONTD: omeprazole (PRILOSEC) 20 MG capsule Take 20 mg by mouth daily.         Review of Systems Review of Systems  Constitutional: Negative for diaphoresis, activity change, appetite change and unexpected weight change.  HENT: Negative for hearing loss, ear pain, facial swelling, mouth sores and neck stiffness.   Eyes: Negative for pain, redness and visual disturbance.  Respiratory: Negative for shortness of breath and wheezing.   Cardiovascular: Negative for chest pain and palpitations.  Gastrointestinal: Negative for diarrhea, blood in stool, abdominal distention and rectal pain.  Genitourinary: Negative for hematuria, flank pain and decreased urine volume.  Musculoskeletal: Negative for myalgias and joint swelling.  Skin: Negative for color change and wound.  Neurological: Negative for syncope and numbness.  Hematological: Negative for adenopathy.  Psychiatric/Behavioral: Negative for hallucinations, self-injury, decreased concentration and agitation.      Objective:   Physical Exam BP 102/60  Pulse 72  Temp(Src) 98.9 F (37.2 C) (Oral)  Ht 5\' 5"  (1.651 m)  Wt 203 lb (92.08 kg)  BMI 33.78 kg/m2  SpO2 96% Physical Exam  VS noted Constitutional: Pt is oriented to person, place, and time. Appears  well-developed and well-nourished.  HENT:  Head: Normocephalic and atraumatic.  Right Ear: External ear normal.  Left Ear: External ear normal.  Nose: Nose normal.  Mouth/Throat: Oropharynx is clear and moist.  Eyes: Conjunctivae and EOM are normal. Pupils are equal, round, and reactive to light.  Neck: Normal range of motion. Neck supple. No JVD present. No tracheal deviation present.  Cardiovascular: Normal rate, regular rhythm, normal heart sounds and intact distal pulses.   Pulmonary/Chest: Effort normal and breath sounds normal.  Abdominal: Soft. Bowel sounds are normal. There is no tenderness.  Musculoskeletal: Normal range of motion. Exhibits no edema.  Lymphadenopathy:  Has no cervical adenopathy.  Neurological: Pt is alert and oriented to person, place, and time. Pt has normal reflexes. No cranial nerve deficit.  Skin: Skin is warm and dry. No rash noted.  Psychiatric:  Has  normal mood and affect. Behavior is normal. 1+ nervous       Assessment & Plan:

## 2010-12-11 NOTE — Assessment & Plan Note (Signed)
Chronic stable - for med refill

## 2010-12-11 NOTE — Assessment & Plan Note (Signed)

## 2010-12-11 NOTE — Assessment & Plan Note (Signed)
Improved on carafate and increased PPI - f/u GI as planned

## 2010-12-11 NOTE — Patient Instructions (Signed)
Continue all other medications as before Your pain medication was refilled today All other refills were sent to your pharmacy Please return in 1 year for your yearly visit, or sooner if needed, with Lab testing done 3-5 days before

## 2011-03-27 ENCOUNTER — Encounter: Payer: Self-pay | Admitting: Cardiology

## 2011-04-25 ENCOUNTER — Encounter: Payer: Self-pay | Admitting: Cardiology

## 2011-04-25 ENCOUNTER — Ambulatory Visit (INDEPENDENT_AMBULATORY_CARE_PROVIDER_SITE_OTHER): Payer: BC Managed Care – PPO | Admitting: Cardiology

## 2011-04-25 DIAGNOSIS — R002 Palpitations: Secondary | ICD-10-CM | POA: Insufficient documentation

## 2011-04-25 DIAGNOSIS — I059 Rheumatic mitral valve disease, unspecified: Secondary | ICD-10-CM

## 2011-04-25 DIAGNOSIS — R079 Chest pain, unspecified: Secondary | ICD-10-CM | POA: Insufficient documentation

## 2011-04-25 NOTE — Assessment & Plan Note (Signed)
I reviewed her echocardiogram from the previous visit. She has no mitral valve prolapse. I clarified this with her. No further evaluation is necessary.

## 2011-04-25 NOTE — Assessment & Plan Note (Signed)
She has no ongoing symptoms and a negative exercise treadmill test earlier this year. Her EKG findings are nonspecific and unchanged. No change in therapy is indicated. No further testing is indicated.

## 2011-04-25 NOTE — Assessment & Plan Note (Signed)
I discussed these with her. She has increasing symptoms in the future I would put a monitor on her. For now he are not particularly symptomatic.

## 2011-04-25 NOTE — Patient Instructions (Signed)
Follow up as needed  The current medical regimen is effective;  continue present plan and medications.  

## 2011-04-25 NOTE — Progress Notes (Signed)
HPI The patient presents for evaluation of chest discomfort and an abnormal EKG. Earlier this year she had an exercise treadmill test which was negative for any evidence of ischemia. Echo demonstrated no mitral valve prolapse.  She said that in May she had some chest discomfort. She describes some right-sided discomfort at rest. It occurred spontaneously and went away spontaneously. It lasted for about 15-20 minutes. She didn't describe associated symptoms. She apparently had an abnormal EKG but I don't have this result. I do see her poor anterior R-wave progression evidence today as it has been previously. Otherwise there is no significant abnormalities on EKG. Since that time she's had no further chest pain. She actually push his lawn mowers and vacuum she gets no chest discomfort, neck or arm discomfort. She does have occasional nighttime palpitations but no presyncope or syncope. She had no weight gain or edema. Substance  Allergies  Allergen Reactions  . Atorvastatin     REACTION: more forgetful  . Penicillins     REACTION: turns blue  . Simvastatin     REACTION: more forgetful  . Tetracycline     REACTION: itch    Current Outpatient Prescriptions  Medication Sig Dispense Refill  . aspirin 81 MG EC tablet Take 81 mg by mouth daily.        Marland Kitchen HYDROcodone-acetaminophen (VICODIN) 5-500 MG per tablet Take 1 tablet by mouth 2 (two) times daily as needed.  60 tablet  1  . omeprazole (PRILOSEC) 40 MG capsule Take 1 capsule (40 mg total) by mouth daily.  90 capsule  3  . pravastatin (PRAVACHOL) 40 MG tablet Take 1 tablet (40 mg total) by mouth daily.  90 tablet  3  . sucralfate (CARAFATE) 1 G tablet Take 1 g by mouth 2 (two) times daily.          Past Medical History  Diagnosis Date  . HYPERLIPIDEMIA 07/22/2007  . MITRAL VALVE PROLAPSE 07/22/2007  . SINUSITIS- ACUTE-NOS 09/09/2007  . GERD 07/22/2007  . IBS 07/23/2007  . Unspecified Vaginitis and Vulvovaginitis 12/17/2007  . DEGENERATIVE  JOINT DISEASE, LUMBAR SPINE 07/23/2007  . BACK PAIN 07/23/2007  . TACHYCARDIA 08/20/2010  . CHEST PAIN 09/04/2010  . Gastric polyp     x3    Past Surgical History  Procedure Date  . Abdominal hysterectomy   . Tubal ligation   . Cholecystectomy   . Tonsillectomy   . Rle tib/fib fx/surgury     ROS:  As stated in the HPI and negative for all other systems.  PHYSICAL EXAM BP 114/78  Pulse 78  Ht 5\' 5"  (1.651 m)  Wt 209 lb (94.802 kg)  BMI 34.78 kg/m2 GENERAL:  Well appearing HEENT:  Pupils equal round and reactive, fundi not visualized, oral mucosa unremarkable NECK:  No jugular venous distention, waveform within normal limits, carotid upstroke brisk and symmetric, no bruits, no thyromegaly LYMPHATICS:  No cervical, inguinal adenopathy LUNGS:  Clear to auscultation bilaterally BACK:  No CVA tenderness CHEST:  Unremarkable HEART:  PMI not displaced or sustained,S1 and S2 within normal limits, no S3, no S4, no clicks, no rubs, no murmurs ABD:  Flat, positive bowel sounds normal in frequency in pitch, no bruits, no rebound, no guarding, no midline pulsatile mass, no hepatomegaly, no splenomegaly EXT:  2 plus pulses throughout, no edema, no cyanosis no clubbing SKIN:  No rashes no nodules NEURO:  Cranial nerves II through XII grossly intact, motor grossly intact throughout PSYCH:  Cognitively intact, oriented to person place  and time  EKG:  Sinus rhythm, rate 78 Poor anterior R-wave progression, no acute ST-T wave changes.  ASSESSMENT AND PLAN

## 2011-07-07 ENCOUNTER — Encounter: Payer: Self-pay | Admitting: Internal Medicine

## 2011-09-17 ENCOUNTER — Encounter: Payer: BC Managed Care – PPO | Admitting: Internal Medicine

## 2011-10-13 ENCOUNTER — Other Ambulatory Visit (INDEPENDENT_AMBULATORY_CARE_PROVIDER_SITE_OTHER): Payer: BC Managed Care – PPO

## 2011-10-13 DIAGNOSIS — Z0000 Encounter for general adult medical examination without abnormal findings: Secondary | ICD-10-CM

## 2011-10-13 LAB — HEPATIC FUNCTION PANEL
Alkaline Phosphatase: 45 U/L (ref 39–117)
Bilirubin, Direct: 0.1 mg/dL (ref 0.0–0.3)
Total Bilirubin: 0.6 mg/dL (ref 0.3–1.2)
Total Protein: 7 g/dL (ref 6.0–8.3)

## 2011-10-13 LAB — URINALYSIS, ROUTINE W REFLEX MICROSCOPIC
Bilirubin Urine: NEGATIVE
Ketones, ur: NEGATIVE
Leukocytes, UA: NEGATIVE
Nitrite: NEGATIVE
Specific Gravity, Urine: 1.01
Total Protein, Urine: NEGATIVE
Urine Glucose: NEGATIVE
Urobilinogen, UA: 1
pH: 6 (ref 5.0–8.0)

## 2011-10-13 LAB — CBC WITH DIFFERENTIAL/PLATELET
Basophils Relative: 0.7 % (ref 0.0–3.0)
Eosinophils Absolute: 0.1 10*3/uL (ref 0.0–0.7)
Eosinophils Relative: 1.5 % (ref 0.0–5.0)
Lymphocytes Relative: 32.6 % (ref 12.0–46.0)
Neutrophils Relative %: 56.4 % (ref 43.0–77.0)
Platelets: 278 10*3/uL (ref 150.0–400.0)
RBC: 4.89 Mil/uL (ref 3.87–5.11)
WBC: 9.1 10*3/uL (ref 4.5–10.5)

## 2011-10-13 LAB — LIPID PANEL
Cholesterol: 142 mg/dL (ref 0–200)
LDL Cholesterol: 85 mg/dL (ref 0–99)
Total CHOL/HDL Ratio: 4
VLDL: 19.6 mg/dL (ref 0.0–40.0)

## 2011-10-13 LAB — TSH: TSH: 2.29 u[IU]/mL (ref 0.35–5.50)

## 2011-10-13 LAB — BASIC METABOLIC PANEL
BUN: 9 mg/dL (ref 6–23)
Chloride: 105 mEq/L (ref 96–112)
Creatinine, Ser: 0.9 mg/dL (ref 0.4–1.2)
Glucose, Bld: 87 mg/dL (ref 70–99)

## 2011-10-24 ENCOUNTER — Ambulatory Visit (INDEPENDENT_AMBULATORY_CARE_PROVIDER_SITE_OTHER): Payer: BC Managed Care – PPO | Admitting: Internal Medicine

## 2011-10-24 ENCOUNTER — Encounter: Payer: Self-pay | Admitting: Internal Medicine

## 2011-10-24 VITALS — BP 110/70 | HR 70 | Temp 98.3°F | Ht 65.0 in | Wt 212.5 lb

## 2011-10-24 DIAGNOSIS — Z0000 Encounter for general adult medical examination without abnormal findings: Secondary | ICD-10-CM

## 2011-10-24 DIAGNOSIS — M5416 Radiculopathy, lumbar region: Secondary | ICD-10-CM

## 2011-10-24 DIAGNOSIS — IMO0002 Reserved for concepts with insufficient information to code with codable children: Secondary | ICD-10-CM

## 2011-10-24 MED ORDER — PRAVASTATIN SODIUM 40 MG PO TABS: 40.0000 mg | ORAL_TABLET | Freq: Every day | ORAL | Status: DC

## 2011-10-24 MED ORDER — HYDROCODONE-ACETAMINOPHEN 7.5-325 MG PO TABS: 1.0000 | ORAL_TABLET | Freq: Every evening | ORAL | Status: AC | PRN

## 2011-10-24 NOTE — Patient Instructions (Addendum)
Please remember to followup with your GYN for the yearly pap smear and/or mammogram Your hydrocodone was changed to the once daily ( bedtime) higher strength medication Your refills were done as you requested today Continue all other medications as before You will be contacted regarding the referral for: MRI for the lower back You will be contacted regarding the referral for: Neurosurgury Please return in 1 year for your yearly visit, or sooner if needed, with Lab testing done 3-5 days before

## 2011-10-24 NOTE — Assessment & Plan Note (Signed)
New worsening x 4 mo with recurrent pain, numbness involving the RLE and perineum without other bowel or bladder change or weakness/falls - for MRI LS spine, and refer NS

## 2011-10-25 ENCOUNTER — Encounter: Payer: Self-pay | Admitting: Internal Medicine

## 2011-10-25 NOTE — Assessment & Plan Note (Signed)

## 2011-10-25 NOTE — Progress Notes (Signed)
Subjective:    Patient ID: Pamela Gibson, female    DOB: 29-Dec-1965, 46 y.o.   MRN: [id number]  HPI  Here for wellness and f/u;  Overall doing ok;  Pt denies CP, worsening SOB, DOE, wheezing, orthopnea, PND, worsening LE edema, palpitations, dizziness or syncope.  Pt denies neurological change such as new Headache, facial or extremity weakness.  Pt denies polydipsia, polyuria, or low sugar symptoms. Pt states overall good compliance with treatment and medications, good tolerability, and trying to follow lower cholesterol diet.  Pt denies worsening depressive symptoms, suicidal ideation or panic. No fever, wt loss, night sweats, loss of appetite, or other constitutional symptoms.  Pt states good ability with ADL's, low fall risk, home safety reviewed and adequate, no significant changes in hearing or vision, and occasionally active with exercise.  Does have also c/o worsening 3 months mid lumbar back pain ongoing with right leg and pernieal numbness and pain recurrent dialy , moderate for which she only wants to take pain med for nighttime to help sleep,but overall worse, has not had prior MRI ro surgical eval, denies bowel or bladder change, fever, wt loss,  gait change or falls.   Past Medical History  Diagnosis Date  . HYPERLIPIDEMIA 07/22/2007  . MITRAL VALVE PROLAPSE 07/22/2007  . SINUSITIS- ACUTE-NOS 09/09/2007  . GERD 07/22/2007  . IBS 07/23/2007  . Unspecified Vaginitis and Vulvovaginitis 12/17/2007  . DEGENERATIVE JOINT DISEASE, LUMBAR SPINE 07/23/2007  . BACK PAIN 07/23/2007  . TACHYCARDIA 08/20/2010  . CHEST PAIN 09/04/2010  . Gastric polyp     x3   Past Surgical History  Procedure Date  . Abdominal hysterectomy   . Tubal ligation   . Cholecystectomy   . Tonsillectomy   . Rle tib/fib fx/surgury     reports that she has never smoked. She does not have any smokeless tobacco history on file. She reports that she does not drink alcohol. Her drug history not on file. family history  includes Angina in her mother; Colon polyps in her mother; Coronary artery disease in her mother; Diabetes in her other; Heart disease in her mother; Hyperlipidemia in an unspecified family member; Hypertension in her other; and Stroke in her mother. Allergies  Allergen Reactions  . Atorvastatin     REACTION: more forgetful  . Penicillins     REACTION: turns blue  . Simvastatin     REACTION: more forgetful  . Tetracycline     REACTION: itch   Current Outpatient Prescriptions on File Prior to Visit  Medication Sig Dispense Refill  . aspirin 81 MG EC tablet Take 81 mg by mouth daily.        . sucralfate (CARAFATE) 1 G tablet Take 1 g by mouth 2 (two) times daily.         Review of Systems Review of Systems  Constitutional: Negative for diaphoresis, activity change, appetite change and unexpected weight change.  HENT: Negative for hearing loss, ear pain, facial swelling, mouth sores and neck stiffness.   Eyes: Negative for pain, redness and visual disturbance.  Respiratory: Negative for shortness of breath and wheezing.   Cardiovascular: Negative for chest pain and palpitations.  Gastrointestinal: Negative for diarrhea, blood in stool, abdominal distention and rectal pain.  Genitourinary: Negative for hematuria, flank pain and decreased urine volume.  Musculoskeletal: Negative for myalgias and joint swelling.  Skin: Negative for color change and wound.  Neurological: Negative for syncope and numbness.  Hematological: Negative for adenopathy.  Psychiatric/Behavioral: Negative for hallucinations, self-injury,  decreased concentration and agitation.      Objective:   Physical Exam BP 110/70  Pulse 70  Temp(Src) 98.3 F (36.8 C) (Oral)  Ht 5\' 5"  (1.651 m)  Wt 212 lb 8 oz (96.389 kg)  BMI 35.36 kg/m2  SpO2 97% Physical Exam  VS noted Constitutional: Pt is oriented to person, place, and time. Appears well-developed and well-nourished.  HENT:  Head: Normocephalic and atraumatic.    Right Ear: External ear normal.  Left Ear: External ear normal.  Nose: Nose normal.  Mouth/Throat: Oropharynx is clear and moist.  Eyes: Conjunctivae and EOM are normal. Pupils are equal, round, and reactive to light.  Neck: Normal range of motion. Neck supple. No JVD present. No tracheal deviation present.  Cardiovascular: Normal rate, regular rhythm, normal heart sounds and intact distal pulses.   Pulmonary/Chest: Effort normal and breath sounds normal.  Abdominal: Soft. Bowel sounds are normal. There is no tenderness.  Musculoskeletal: Normal range of motion. Exhibits no edema.  Lymphadenopathy:  Has no cervical adenopathy.  Neurological: Pt is alert and oriented to person, place, and time. Pt has normal reflexes. No cranial nerve deficit. Motor/sens/dtr intact, gait intact Skin: Skin is warm and dry. No rash noted.  Psychiatric:  Has  normal mood and affect. Behavior is normal.  Lumbar: diffuse tender, no swelling or erythema    Assessment & Plan:

## 2011-10-30 ENCOUNTER — Ambulatory Visit
Admission: RE | Admit: 2011-10-30 | Discharge: 2011-10-30 | Disposition: A | Discharge status: Home / Self Care | Payer: BC Managed Care – PPO | Source: Ambulatory Visit | Attending: Internal Medicine | Admitting: Internal Medicine

## 2011-10-30 DIAGNOSIS — M5416 Radiculopathy, lumbar region: Secondary | ICD-10-CM

## 2011-12-08 ENCOUNTER — Encounter: Payer: Self-pay | Admitting: Internal Medicine

## 2011-12-08 ENCOUNTER — Ambulatory Visit (INDEPENDENT_AMBULATORY_CARE_PROVIDER_SITE_OTHER): Payer: BC Managed Care – PPO | Admitting: Internal Medicine

## 2011-12-08 VITALS — BP 102/68 | HR 79 | Temp 97.9°F | Ht 65.0 in | Wt 213.0 lb

## 2011-12-08 DIAGNOSIS — F419 Anxiety disorder, unspecified: Secondary | ICD-10-CM | POA: Insufficient documentation

## 2011-12-08 DIAGNOSIS — M5416 Radiculopathy, lumbar region: Secondary | ICD-10-CM

## 2011-12-08 DIAGNOSIS — F411 Generalized anxiety disorder: Secondary | ICD-10-CM

## 2011-12-08 DIAGNOSIS — Z0000 Encounter for general adult medical examination without abnormal findings: Secondary | ICD-10-CM

## 2011-12-08 DIAGNOSIS — J209 Acute bronchitis, unspecified: Secondary | ICD-10-CM | POA: Insufficient documentation

## 2011-12-08 DIAGNOSIS — IMO0002 Reserved for concepts with insufficient information to code with codable children: Secondary | ICD-10-CM

## 2011-12-08 MED ORDER — AZITHROMYCIN 250 MG PO TABS: ORAL_TABLET | ORAL | Status: AC

## 2011-12-08 MED ORDER — HYDROCODONE-ACETAMINOPHEN 7.5-325 MG PO TABS: 1.0000 | ORAL_TABLET | Freq: Three times a day (TID) | ORAL | Status: AC | PRN

## 2011-12-08 MED ORDER — BENZONATATE 100 MG PO CAPS: ORAL_CAPSULE | ORAL | Status: DC

## 2011-12-08 NOTE — Progress Notes (Signed)
Subjective:    Patient ID: Pamela Gibson, female    DOB: Jul 09, 1966, 46 y.o.   MRN: [id number]  HPI  Here with acute onset mild to mod 2-3 days ST, HA, general weakness and malaise, with prod cough greenish sputum, but Pamela Gibson denies chest pain, increased sob or doe, wheezing, orthopnea, PND, increased LE swelling, palpitations, dizziness or syncope.  Also mentions Pamela Gibson has been tx for back pain with known lumbar disc dz, cannot afford f/u with NS and the Pamela Gibson to which Pamela Gibson was referred, asks for further pain management.  Pamela Gibson denies new neurological symptoms such as new headache, or facial or extremity weakness or numbness   Pamela Gibson denies polydipsia, polyuria.  Denies worsening depressive symptoms, suicidal ideation, or panic. Past Medical History  Diagnosis Date  . HYPERLIPIDEMIA 07/22/2007  . MITRAL VALVE PROLAPSE 07/22/2007  . SINUSITIS- ACUTE-NOS 09/09/2007  . GERD 07/22/2007  . IBS 07/23/2007  . Unspecified Vaginitis and Vulvovaginitis 12/17/2007  . DEGENERATIVE JOINT DISEASE, LUMBAR SPINE 07/23/2007  . BACK PAIN 07/23/2007  . TACHYCARDIA 08/20/2010  . CHEST PAIN 09/04/2010  . Gastric polyp     x3   Past Surgical History  Procedure Date  . Abdominal hysterectomy   . Tubal ligation   . Cholecystectomy   . Tonsillectomy   . Rle tib/fib fx/surgury     reports that Pamela Gibson has never smoked. Pamela Gibson does not have any smokeless tobacco history on file. Pamela Gibson reports that Pamela Gibson does not drink alcohol. Her drug history not on file. family history includes Angina in her mother; Colon polyps in her mother; Coronary artery disease in her mother; Diabetes in her other; Heart disease in her mother; Hyperlipidemia in an unspecified family member; Hypertension in her other; and Stroke in her mother. Allergies  Allergen Reactions  . Atorvastatin     REACTION: more forgetful  . Penicillins     REACTION: turns blue  . Simvastatin     REACTION: more forgetful  . Tetracycline     REACTION: itch   Current Outpatient  Prescriptions on File Prior to Visit  Medication Sig Dispense Refill  . aspirin 81 MG EC tablet Take 81 mg by mouth daily.        . famotidine (PEPCID) 40 MG tablet Take 40 mg by mouth 2 (two) times daily.      . pravastatin (PRAVACHOL) 40 MG tablet Take 1 tablet (40 mg total) by mouth daily.  90 tablet  3  . sucralfate (CARAFATE) 1 G tablet Take 1 g by mouth 2 (two) times daily.         Review of Systems Review of Systems  Constitutional: Negative for diaphoresis and unexpected weight change.  Gastrointestinal: Negative for vomiting and blood in stool.  Genitourinary: Negative for hematuria and decreased urine volume.  Musculoskeletal: Negative for gait problem.  Skin: Negative for color change and wound.  Neurological: Negative for tremors and numbness.  Psychiatric/Behavioral: Negative for decreased concentration. The patient is not hyperactive.      Objective:   Physical Exam BP 102/68  Pulse 79  Temp(Src) 97.9 F (36.6 C) (Oral)  Ht 5\' 5"  (1.651 m)  Wt 213 lb (96.616 kg)  BMI 35.44 kg/m2  SpO2 97% Physical Exam  VS noted, mild ill Constitutional: Pamela Gibson appears well-developed and well-nourished.  HENT: Head: Normocephalic.  Right Ear: External ear normal.  Left Ear: External ear normal.  Bilat tm's mild erythema.  Sinus nontender.  Pharynx mild erythema Eyes: Conjunctivae and EOM are normal. Pupils are  equal, round, and reactive to light.  Neck: Normal range of motion. Neck supple.  Cardiovascular: Normal rate and regular rhythm.   Pulmonary/Chest: Effort normal and breath sounds normal.  Neurological: Pamela Gibson is alert. Motor/dtr/sens intact Skin: Skin is warm. No erythema.  Psychiatric: Pamela Gibson behavior is normal. Thought content normal. 1+ nervous, not depressed affect    Assessment & Plan:

## 2011-12-08 NOTE — Assessment & Plan Note (Signed)
Mild to mod, for antibx course,  to f/u any worsening symptoms or concerns 

## 2011-12-08 NOTE — Patient Instructions (Addendum)
Take all new medications as prescribed Continue all other medications as before Please have the pharmacy call with any refills you may need.  

## 2011-12-08 NOTE — Assessment & Plan Note (Signed)
stable overall by hx and exam,  and pt to continue medical treatment as before, declines further tx or cousneling

## 2011-12-08 NOTE — Assessment & Plan Note (Signed)
With ongoing pain, nonsurgical, cannot afford PT or NS f/u at this time, for pain control,  to f/u any worsening symptoms or concerns

## 2012-02-02 ENCOUNTER — Other Ambulatory Visit: Payer: Self-pay | Admitting: *Deleted

## 2012-02-02 MED ORDER — HYDROCODONE-ACETAMINOPHEN 7.5-325 MG PO TABS: 1.0000 | ORAL_TABLET | Freq: Two times a day (BID) | ORAL | Status: DC | PRN

## 2012-02-02 NOTE — Telephone Encounter (Signed)
Request for Hydrocodone-APAP 7.5-325 [last refill 04.18.13 #[id number]]/SLS Please advise.

## 2012-02-02 NOTE — Telephone Encounter (Signed)
Done hardcopy to sharon 

## 2012-02-03 NOTE — Telephone Encounter (Signed)
Faxed hardcopy to pharmacy. 

## 2012-02-05 ENCOUNTER — Telehealth: Payer: Self-pay

## 2012-02-05 MED ORDER — HYDROCODONE-ACETAMINOPHEN 7.5-325 MG PO TABS: 1.0000 | ORAL_TABLET | Freq: Three times a day (TID) | ORAL | Status: AC | PRN

## 2012-02-05 NOTE — Telephone Encounter (Signed)
rx corrected - Done hardcopy to robin  

## 2012-02-05 NOTE — Telephone Encounter (Signed)
Faxed hardcopy to pharmacy per pt. Request and called to inform patient prescription corrected.

## 2012-02-05 NOTE — Telephone Encounter (Signed)
Pt called stating that she was previously Rx'd Vicodin to be taken TID but recent refill (02/02/2012) was for BID. Pt says she was not aware that medication was being reduced, please advise on correct dosing.

## 2012-02-12 ENCOUNTER — Ambulatory Visit (INDEPENDENT_AMBULATORY_CARE_PROVIDER_SITE_OTHER): Payer: BC Managed Care – PPO | Admitting: Family Medicine

## 2012-02-12 VITALS — BP 130/79 | HR 83 | Temp 98.5°F | Resp 18 | Ht 67.0 in | Wt 212.0 lb

## 2012-02-12 DIAGNOSIS — R110 Nausea: Secondary | ICD-10-CM

## 2012-02-12 DIAGNOSIS — R519 Headache, unspecified: Secondary | ICD-10-CM

## 2012-02-12 DIAGNOSIS — R42 Dizziness and giddiness: Secondary | ICD-10-CM

## 2012-02-12 DIAGNOSIS — H8110 Benign paroxysmal vertigo, unspecified ear: Secondary | ICD-10-CM

## 2012-02-12 LAB — POCT CBC
MCH, POC: 30.5 pg (ref 27–31.2)
MCV: 89.3 fL (ref 80–97)
MID (cbc): 0.9 (ref 0–0.9)
POC LYMPH PERCENT: 32.5 %L (ref 10–50)
Platelet Count, POC: 336 10*3/uL (ref 142–424)
RBC: 4.99 M/uL (ref 4.04–5.48)
WBC: 12.9 10*3/uL — AB (ref 4.6–10.2)

## 2012-02-12 MED ORDER — MECLIZINE HCL 25 MG PO TABS: 25.0000 mg | ORAL_TABLET | Freq: Three times a day (TID) | ORAL | Status: DC | PRN

## 2012-02-12 NOTE — Progress Notes (Signed)
  Subjective:    Patient ID: Pamela Gibson, female    DOB: February 12, 1966, 46 y.o.   MRN: [id number]  HPI Comments: Intermittent headache for couple weeks.  Today got dizzy earlier today with spinning prior to noon today lasting  About 35 seconds.  About 1 1/2 hour later she got nauseated without dizziness.  Her head became real tingling about 15 later involving the "whole inside" of the head.  She had been having less intense episodes of her head these past few weeks.  She also got lightheaded.no speech, thought or swallowing difficulties.  No drooling. No CP or SOB.  Head is a tight pressure lasting 2-3 hours.  The headaches are not bad enough to take medicines.  No light or loud noise sensitivity.  History of migraines "years ago".  No arm/leg paresthesia or weakness.   No tinnitus, hearing loss or leg pain.  History of recurrent GI polyps s/p multiple resections of benign polyps over 2 years.  She is expected to undergo another procedure in the next few weeks.  Headache       Review of Systems  Neurological: Positive for headaches.       Objective:   Physical Exam  Constitutional: She is oriented to person, place, and time. She appears well-developed and well-nourished.  HENT:  Head: Normocephalic and atraumatic.  Eyes: Conjunctivae and EOM are normal. Pupils are equal, round, and reactive to light.  Neck: Normal range of motion. Neck supple.  Cardiovascular: Normal rate and regular rhythm.   No murmur heard. Pulmonary/Chest: Effort normal and breath sounds normal. No respiratory distress.  Neurological: She is alert and oriented to person, place, and time. She has normal strength and normal reflexes. She displays normal reflexes. No cranial nerve deficit. She exhibits normal muscle tone. She displays a negative Romberg sign. Coordination normal.  Skin: Skin is warm and dry.  Psychiatric: She has a normal mood and affect. Her behavior is normal. Thought content normal.           Assessment & Plan:  Paresthesias of the head  Non focal neuro exam  Headache  More tension like sxs  Tylenol prn - avoid NSAIDS  Vertigo  Meclizine 25 mg 1 po tid prn #30. SED  Elevated WBC  She has history of intermittently elevated WBC over the past few years.  f/u  In 1 week PCP - Dr. Oliver Barre for recheck or sooner if her sxs progress  Sed rate

## 2012-02-12 NOTE — Patient Instructions (Addendum)
General Headache, Without Cause A general headache has no specific cause. These headaches are not life-threatening. They will not lead to other types of headaches. HOME CARE    Make and keep follow-up visits with your doctor.   Only take medicine as told by your doctor.   Try to relax, get a massage, or use your thoughts to control your body (biofeedback).   Apply cold or heat to the head and neck. Apply 3 or 4 times a day or as needed.  Finding out the results of your test Ask when your test results will be ready. Make sure you get your test results. GET HELP RIGHT AWAY IF:    You have problems with medicine.   Your medicine does not help relieve pain.   Your headache changes or becomes worse.   You feel sick to your stomach (nauseous) or throw up (vomit).   You have a temperature by mouth above 102 F (38.9 C), not controlled by medicine.   Your have a stiff neck.   You have vision loss.   You have muscle weakness.   You lose control of your muscles.   You lose balance or have trouble walking.   You feel like you are going to pass out (faint).  MAKE SURE YOU:    Understand these instructions.   Will watch this condition.   Will get help right away if you are not doing well or get worse.  Document Released: 05/20/2008 Document Revised: 07/31/2011 Document Reviewed: 05/20/2008 [facility] Patient Information 2012 [place], [place].Vertigo Vertigo means you feel like you or your surroundings are moving when they are not. Vertigo can be dangerous if it occurs when you are at work, driving, or performing difficult activities.   CAUSES   Vertigo occurs when there is a conflict of signals sent to your brain from the visual and sensory systems in your body. There are many different causes of vertigo, including:  Infections, especially in the inner ear.   A bad reaction to a drug or misuse of alcohol and medicines.   Withdrawal from drugs or alcohol.   Rapidly changing  positions, such as lying down or rolling over in bed.   A migraine headache.   Decreased blood flow to the brain.   Increased pressure in the brain from a head injury, infection, tumor, or bleeding.  SYMPTOMS   You may feel as though the world is spinning around or you are falling to the ground. Because your balance is upset, vertigo can cause nausea and vomiting. You may have involuntary eye movements (nystagmus). DIAGNOSIS   Vertigo is usually diagnosed by physical exam. If the cause of your vertigo is unknown, your caregiver may perform imaging tests, such as an MRI scan (magnetic resonance imaging). TREATMENT   Most cases of vertigo resolve on their own, without treatment. Depending on the cause, your caregiver may prescribe certain medicines. If your vertigo is related to body position issues, your caregiver may recommend movements or procedures to correct the problem. In rare cases, if your vertigo is caused by certain inner ear problems, you may need surgery. HOME CARE INSTRUCTIONS    Follow your caregiver's instructions.   Avoid driving.   Avoid operating heavy machinery.   Avoid performing any tasks that would be dangerous to you or others during a vertigo episode.   Tell your caregiver if you notice that certain medicines seem to be causing your vertigo. Some of the medicines used to treat vertigo episodes can actually  make them worse in some people.  SEEK IMMEDIATE MEDICAL CARE IF:    Your medicines do not relieve your vertigo or are making it worse.   You develop problems with talking, walking, weakness, or using your arms, hands, or legs.   You develop severe headaches.   Your nausea or vomiting continues or gets worse.   You develop visual changes.   A family member notices behavioral changes.   Your condition gets worse.  MAKE SURE YOU:  Understand these instructions.   Will watch your condition.   Will get help right away if you are not doing well or get  worse.  Document Released: 05/21/2005 Document Revised: 07/31/2011 Document Reviewed: 02/27/2011 [facility] Patient Information 2012 [place], [place].   Follow up with Dr. Jonny Ruiz to have you white blood cell count recheck in one week

## 2012-02-13 ENCOUNTER — Ambulatory Visit (INDEPENDENT_AMBULATORY_CARE_PROVIDER_SITE_OTHER): Payer: BC Managed Care – PPO | Admitting: Internal Medicine

## 2012-02-13 ENCOUNTER — Encounter: Payer: Self-pay | Admitting: Internal Medicine

## 2012-02-13 VITALS — BP 102/62 | HR 79 | Temp 97.8°F | Ht 65.0 in | Wt 214.0 lb

## 2012-02-13 DIAGNOSIS — R42 Dizziness and giddiness: Secondary | ICD-10-CM

## 2012-02-13 DIAGNOSIS — F411 Generalized anxiety disorder: Secondary | ICD-10-CM

## 2012-02-13 DIAGNOSIS — R110 Nausea: Secondary | ICD-10-CM

## 2012-02-13 DIAGNOSIS — F419 Anxiety disorder, unspecified: Secondary | ICD-10-CM

## 2012-02-13 DIAGNOSIS — J309 Allergic rhinitis, unspecified: Secondary | ICD-10-CM

## 2012-02-13 LAB — COMPREHENSIVE METABOLIC PANEL
Albumin: 4.4 g/dL (ref 3.5–5.2)
CO2: 27 mEq/L (ref 19–32)
Calcium: 9.8 mg/dL (ref 8.4–10.5)
Glucose, Bld: 99 mg/dL (ref 70–99)
Sodium: 138 mEq/L (ref 135–145)
Total Bilirubin: 0.4 mg/dL (ref 0.3–1.2)
Total Protein: 7 g/dL (ref 6.0–8.3)

## 2012-02-13 MED ORDER — MECLIZINE HCL 25 MG PO TABS: 25.0000 mg | ORAL_TABLET | Freq: Three times a day (TID) | ORAL | Status: AC | PRN

## 2012-02-13 MED ORDER — METHYLPREDNISOLONE ACETATE 80 MG/ML IJ SUSP
120.0000 mg | Freq: Once | INTRAMUSCULAR | Status: AC
  Administered 2012-02-13: 120 mg via INTRAMUSCULAR

## 2012-02-13 MED ORDER — FEXOFENADINE HCL 180 MG PO TABS: 180.0000 mg | ORAL_TABLET | Freq: Every day | ORAL | Status: DC

## 2012-02-13 NOTE — Patient Instructions (Addendum)
You had the steroid shot today Take all new medications as prescribed - the allegra for allergies, and antivert for dizziness (if needed) Continue all other medications as before Please have the pharmacy call with any refills you may need. You can also take Mucinex (or it's generic off brand) for congestion

## 2012-02-14 ENCOUNTER — Encounter: Payer: Self-pay | Admitting: Internal Medicine

## 2012-02-14 LAB — SEDIMENTATION RATE: Sed Rate: 1 mm/hr (ref 0–22)

## 2012-02-14 NOTE — Assessment & Plan Note (Signed)
Mild to mod, for allegra asd,  to f/u any worsening symptoms or concerns 

## 2012-02-14 NOTE — Progress Notes (Signed)
Subjective:    Patient ID: Pamela Gibson, female    DOB: [birth year], 46 y.o.   MRN: [id number]  HPI  Here after illness x 2 wks with vertigo, nausea and mild upper resp type congestion.  Was seen at [facility] , had mild elev WBC but o/w labs and exam neg per pt. Does have several wks ongoing nasal allergy symptoms with clear congestion, itch and sneeze, without fever, pain, ST, cough or wheezing.  Has some tingling to the head and she is worried about brain tumor.  Denies worsening depressive symptoms, suicidal ideation, or panic, though has ongoing anxiety.   Pt denies fever, wt loss, night sweats, loss of appetite, or other constitutional symptoms .  Pt denies chest pain, increased sob or doe, wheezing, orthopnea, PND, increased LE swelling, palpitations, dizziness or syncope. Pt denies new neurological symptoms such as new headache, or facial or extremity weakness or numbness Past Medical History  Diagnosis Date  . HYPERLIPIDEMIA 07/22/2007  . MITRAL VALVE PROLAPSE 07/22/2007  . SINUSITIS- ACUTE-NOS 09/09/2007  . GERD 07/22/2007  . IBS 07/23/2007  . Unspecified Vaginitis and Vulvovaginitis 12/17/2007  . DEGENERATIVE JOINT DISEASE, LUMBAR SPINE 07/23/2007  . BACK PAIN 07/23/2007  . TACHYCARDIA 08/20/2010  . CHEST PAIN 09/04/2010  . Gastric polyp     x3   Past Surgical History  Procedure Date  . Abdominal hysterectomy   . Tubal ligation   . Cholecystectomy   . Tonsillectomy   . Rle tib/fib fx/surgury     reports that she has never smoked. She does not have any smokeless tobacco history on file. She reports that she does not drink alcohol. Her drug history not on file. family history includes Angina in her mother; Colon polyps in her mother; Coronary artery disease in her mother; Diabetes in her other; Heart disease in her mother; Hyperlipidemia in an unspecified family member; Hypertension in her other; and Stroke in her mother. Allergies  Allergen Reactions  . Atorvastatin     REACTION:  more forgetful  . Penicillins     REACTION: turns blue  . Simvastatin     REACTION: more forgetful  . Tetracycline     REACTION: itch   Current Outpatient Prescriptions on File Prior to Visit  Medication Sig Dispense Refill  . aspirin 81 MG EC tablet Take 81 mg by mouth daily.        . famotidine (PEPCID) 40 MG tablet Take 40 mg by mouth 2 (two) times daily.      Marland Kitchen HYDROcodone-acetaminophen (NORCO) 7.5-325 MG per tablet Take 1 tablet by mouth 3 (three) times daily as needed for pain.  90 tablet  1  . pravastatin (PRAVACHOL) 40 MG tablet Take 1 tablet (40 mg total) by mouth daily.  90 tablet  3  . sucralfate (CARAFATE) 1 G tablet Take 1 g by mouth 2 (two) times daily.        . benzonatate (TESSALON PERLES) 100 MG capsule 1-2 by mouth three times as needed for cough  90 capsule  1  . fexofenadine (ALLEGRA) 180 MG tablet Take 1 tablet (180 mg total) by mouth daily.  90 tablet  3   Review of Systems Review of Systems  Constitutional: Negative for diaphoresis and unexpected weight change.  HENT: Negative for drooling and tinnitus.   Eyes: Negative for photophobia and visual disturbance.  Respiratory: Negative for choking and stridor.   Gastrointestinal: Negative for vomiting and blood in stool.  Genitourinary: Negative for hematuria and decreased urine volume.  Musculoskeletal: Negative for gait problem.  Skin: Negative for color change and wound.  Neurological: Negative for tremors and numbness.    Objective:   Physical Exam BP 102/62  Pulse 79  Temp 97.8 F (36.6 C) (Oral)  Ht 5\' 5"  (1.651 m)  Wt 214 lb (97.07 kg)  BMI 35.61 kg/m2  SpO2 96% Physical Exam  VS noted Constitutional: Pt appears well-developed and well-nourished.  HENT: Head: Normocephalic.  Right Ear: External ear normal.  Left Ear: External ear normal.  Left tm's mod erythema, right trace erythema, no effusions.  Sinus nontender.  Pharynx mild erythema Eyes: Conjunctivae and EOM are normal. Pupils are equal,  round, and reactive to light.  Neck: Normal range of motion. Neck supple.  Cardiovascular: Normal rate and regular rhythm.   Pulmonary/Chest: Effort normal and breath sounds normal.  Abd:  Soft, NT, non-distended, + BS Neurological: Pt is alert. No cranial nerve defecit, motor/sens/dtr/gait intact Skin: Skin is warm. No erythema.  Psychiatric: Pt behavior is normal. Thought content normal. 1+ nervous    Assessment & Plan:

## 2012-02-14 NOTE — Assessment & Plan Note (Signed)
Likely related to left middle ear involvement with allergies- agree with meclizine prn,  to f/u any worsening symptoms or concerns, pt educated and reassured

## 2012-02-14 NOTE — Assessment & Plan Note (Signed)
stable overall by hx and exam, most recent data reviewed with pt, and pt to continue medical treatment as before Lab Results  Component Value Date   WBC 12.9* 02/12/2012   HGB 15.2 02/12/2012   HCT 44.6 02/12/2012   PLT 278.0 10/13/2011   GLUCOSE 99 02/12/2012   CHOL 142 10/13/2011   TRIG 98.0 10/13/2011   HDL 37.80* 10/13/2011   LDLDIRECT 128.8 07/18/2008   LDLCALC 85 10/13/2011   ALT 22 02/12/2012   AST 20 02/12/2012   NA 138 02/12/2012   K 4.1 02/12/2012   CL 103 02/12/2012   CREATININE 0.83 02/12/2012   BUN 8 02/12/2012   CO2 27 02/12/2012   TSH 2.778 02/12/2012

## 2012-04-05 ENCOUNTER — Telehealth: Payer: Self-pay | Admitting: Internal Medicine

## 2012-04-05 MED ORDER — HYDROCODONE-ACETAMINOPHEN 7.5-325 MG PO TABS: 1.0000 | ORAL_TABLET | Freq: Three times a day (TID) | ORAL | Status: AC | PRN

## 2012-04-05 NOTE — Telephone Encounter (Signed)
The pt called the triage line and is hoping to get a refill on hydrocodone.  (I dont see this on her med list...)   Thanks!

## 2012-04-05 NOTE — Telephone Encounter (Signed)
Done hardcopy to robin  

## 2012-04-05 NOTE — Telephone Encounter (Signed)
Called the patient informed prescription requested has been faxed to pharmacy.

## 2012-05-31 ENCOUNTER — Other Ambulatory Visit: Payer: Self-pay | Admitting: Internal Medicine

## 2012-05-31 NOTE — Telephone Encounter (Signed)
Faxed hardcopy to pharmacy. 

## 2012-05-31 NOTE — Telephone Encounter (Signed)
Done hardcopy to robin  

## 2012-07-26 ENCOUNTER — Other Ambulatory Visit: Payer: Self-pay | Admitting: Internal Medicine

## 2012-07-27 NOTE — Telephone Encounter (Signed)
Done hardcopy to robin  

## 2012-07-27 NOTE — Telephone Encounter (Signed)
Faxed hardcopy to pharmacy. 

## 2012-09-22 ENCOUNTER — Other Ambulatory Visit: Payer: Self-pay | Admitting: Internal Medicine

## 2012-09-22 NOTE — Telephone Encounter (Signed)
Done hardcopy to robin  

## 2012-09-22 NOTE — Telephone Encounter (Signed)
Faxed hardcopy to pharmacy. 

## 2012-10-19 ENCOUNTER — Other Ambulatory Visit (INDEPENDENT_AMBULATORY_CARE_PROVIDER_SITE_OTHER): Payer: BC Managed Care – PPO

## 2012-10-19 DIAGNOSIS — Z0000 Encounter for general adult medical examination without abnormal findings: Secondary | ICD-10-CM

## 2012-10-19 LAB — CBC WITH DIFFERENTIAL/PLATELET
Basophils Absolute: 0 10*3/uL (ref 0.0–0.1)
Eosinophils Relative: 1.4 % (ref 0.0–5.0)
HCT: 41.6 % (ref 36.0–46.0)
Lymphocytes Relative: 36.4 % (ref 12.0–46.0)
Monocytes Relative: 8.4 % (ref 3.0–12.0)
Neutrophils Relative %: 53.3 % (ref 43.0–77.0)
Platelets: 256 10*3/uL (ref 150.0–400.0)
RDW: 12.6 % (ref 11.5–14.6)
WBC: 9.2 10*3/uL (ref 4.5–10.5)

## 2012-10-19 LAB — TSH: TSH: 2.07 u[IU]/mL (ref 0.35–5.50)

## 2012-10-19 LAB — URINALYSIS, ROUTINE W REFLEX MICROSCOPIC
Ketones, ur: NEGATIVE
Urine Glucose: NEGATIVE
Urobilinogen, UA: 1 (ref 0.0–1.0)

## 2012-10-19 LAB — HEPATIC FUNCTION PANEL
ALT: 61 U/L — ABNORMAL HIGH (ref 0–35)
AST: 36 U/L (ref 0–37)
Albumin: 3.5 g/dL (ref 3.5–5.2)
Total Protein: 6.5 g/dL (ref 6.0–8.3)

## 2012-10-19 LAB — LIPID PANEL
Cholesterol: 110 mg/dL (ref 0–200)
HDL: 30.6 mg/dL — ABNORMAL LOW (ref >39.00)
Total CHOL/HDL Ratio: 4
Triglycerides: 91 mg/dL (ref 0.0–149.0)

## 2012-10-19 LAB — BASIC METABOLIC PANEL
CO2: 26 mEq/L (ref 19–32)
Calcium: 8.6 mg/dL (ref 8.4–10.5)
Sodium: 138 mEq/L (ref 135–145)

## 2012-10-27 ENCOUNTER — Ambulatory Visit (INDEPENDENT_AMBULATORY_CARE_PROVIDER_SITE_OTHER): Payer: BC Managed Care – PPO | Admitting: Internal Medicine

## 2012-10-27 ENCOUNTER — Other Ambulatory Visit (INDEPENDENT_AMBULATORY_CARE_PROVIDER_SITE_OTHER): Payer: BC Managed Care – PPO

## 2012-10-27 ENCOUNTER — Encounter: Payer: Self-pay | Admitting: Internal Medicine

## 2012-10-27 VITALS — BP 140/88 | HR 87 | Temp 97.8°F | Ht 66.0 in | Wt 222.2 lb

## 2012-10-27 DIAGNOSIS — R945 Abnormal results of liver function studies: Secondary | ICD-10-CM

## 2012-10-27 DIAGNOSIS — F411 Generalized anxiety disorder: Secondary | ICD-10-CM

## 2012-10-27 DIAGNOSIS — Z0000 Encounter for general adult medical examination without abnormal findings: Secondary | ICD-10-CM

## 2012-10-27 DIAGNOSIS — R7989 Other specified abnormal findings of blood chemistry: Secondary | ICD-10-CM | POA: Insufficient documentation

## 2012-10-27 DIAGNOSIS — F419 Anxiety disorder, unspecified: Secondary | ICD-10-CM

## 2012-10-27 LAB — HEPATIC FUNCTION PANEL
ALT: 35 U/L (ref 0–35)
Alkaline Phosphatase: 58 U/L (ref 39–117)
Bilirubin, Direct: 0.1 mg/dL (ref 0.0–0.3)
Total Bilirubin: 0.6 mg/dL (ref 0.3–1.2)
Total Protein: 7.5 g/dL (ref 6.0–8.3)

## 2012-10-27 MED ORDER — ALPRAZOLAM 0.25 MG PO TABS: 0.2500 mg | ORAL_TABLET | Freq: Every day | ORAL | Status: DC | PRN

## 2012-10-27 NOTE — Progress Notes (Signed)
Subjective:    Patient ID: Pamela Gibson, female    DOB: Jul 14, 1966, 47 y.o.   MRN: [id number]  HPI  Here for wellness and f/u;  Overall doing ok;  Pt denies CP, worsening SOB, DOE, wheezing, orthopnea, PND, worsening LE edema, palpitations, dizziness or syncope.  Pt denies neurological change such as new headache, facial or extremity weakness.  Pt denies polydipsia, polyuria, or low sugar symptoms. Pt states overall good compliance with treatment and medications, good tolerability, and has been trying to follow lower cholesterol diet.  Pt denies worsening depressive symptoms, suicidal ideation or panic. No fever, night sweats, wt loss, loss of appetite, or other constitutional symptoms.  Pt states good ability with ADL's, has low fall risk, home safety reviewed and adequate, no other significant changes in hearing or vision, and only occasionally active with exercise.  Did have GI upset with fever approx 1 wk prior to labs.  Gained 10 lbs in the past yr, plans to start walking soon and better diet.  More stress recently with son in army, and occaisional low mood, mother with changing moods lately. Past Medical History  Diagnosis Date  . HYPERLIPIDEMIA 07/22/2007  . MITRAL VALVE PROLAPSE 07/22/2007  . SINUSITIS- ACUTE-NOS 09/09/2007  . GERD 07/22/2007  . IBS 07/23/2007  . Unspecified Vaginitis and Vulvovaginitis 12/17/2007  . DEGENERATIVE JOINT DISEASE, LUMBAR SPINE 07/23/2007  . BACK PAIN 07/23/2007  . TACHYCARDIA 08/20/2010  . CHEST PAIN 09/04/2010  . Gastric polyp     x3   Past Surgical History  Procedure Laterality Date  . Abdominal hysterectomy    . Tubal ligation    . Cholecystectomy    . Tonsillectomy    . Rle tib/fib fx/surgury      reports that she has never smoked. She does not have any smokeless tobacco history on file. She reports that she does not drink alcohol. Her drug history is not on file. family history includes Angina in her mother; Colon polyps in her mother; Coronary  artery disease in her mother; Diabetes in her other; Heart disease in her mother; Hyperlipidemia in an unspecified family member; Hypertension in her other; and Stroke in her mother. Allergies  Allergen Reactions  . Atorvastatin     REACTION: more forgetful  . Penicillins     REACTION: turns blue  . Simvastatin     REACTION: more forgetful  . Tetracycline     REACTION: itch   Current Outpatient Prescriptions on File Prior to Visit  Medication Sig Dispense Refill  . aspirin 81 MG EC tablet Take 81 mg by mouth daily.        . famotidine (PEPCID) 40 MG tablet Take 40 mg by mouth 2 (two) times daily.      Marland Kitchen HYDROcodone-acetaminophen (NORCO) 7.5-325 MG per tablet TAKE 1 TABLET 3 TIMES DAILY AS NEEDED FOR PAIN  90 tablet  1  . HYDROcodone-acetaminophen (NORCO) 7.5-325 MG per tablet TAKE 1 TABLET BY MOUTH 3 TIMES A DAY AS NEEDED FOR PAIN  90 tablet  1  . pravastatin (PRAVACHOL) 40 MG tablet Take 1 tablet (40 mg total) by mouth daily.  90 tablet  3  . sucralfate (CARAFATE) 1 G tablet Take 1 g by mouth 2 (two) times daily.         No current facility-administered medications on file prior to visit.   Review of Systems .Constitutional: Negative for diaphoresis, activity change, appetite change or unexpected weight change.  HENT: Negative for hearing loss, ear pain, facial swelling,  mouth sores and neck stiffness.   Eyes: Negative for pain, redness and visual disturbance.  Respiratory: Negative for shortness of breath and wheezing.   Cardiovascular: Negative for chest pain and palpitations.  Gastrointestinal: Negative for diarrhea, blood in stool, abdominal distention or other pain Genitourinary: Negative for hematuria, flank pain or change in urine volume.  Musculoskeletal: Negative for myalgias and joint swelling.  Skin: Negative for color change and wound.  Neurological: Negative for syncope and numbness. other than noted Hematological: Negative for adenopathy.  Psychiatric/Behavioral:  Negative for hallucinations, self-injury, decreased concentration and agitation.      Objective:   Physical Exam BP 140/88  Pulse 87  Temp(Src) 97.8 F (36.6 C) (Oral)  Ht 5\' 6"  (1.676 m)  Wt 222 lb 4 oz (100.812 kg)  BMI 35.89 kg/m2  SpO2 97% VS noted,  Constitutional: Pt is oriented to person, place, and time. Appears well-developed and well-nourished. Pamela Gibson Head: Normocephalic and atraumatic.  Right Ear: External ear normal.  Left Ear: External ear normal.  Nose: Nose normal.  Mouth/Throat: Oropharynx is clear and moist.  Eyes: Conjunctivae and EOM are normal. Pupils are equal, round, and reactive to light.  Neck: Normal range of motion. Neck supple. No JVD present. No tracheal deviation present.  Cardiovascular: Normal rate, regular rhythm, normal heart sounds and intact distal pulses.   Pulmonary/Chest: Effort normal and breath sounds normal.  Abdominal: Soft. Bowel sounds are normal. There is no tenderness. No HSM  Musculoskeletal: Normal range of motion. Exhibits no edema.  Lymphadenopathy:  Has no cervical adenopathy.  Neurological: Pt is alert and oriented to person, place, and time. Pt has normal reflexes. No cranial nerve deficit.  Skin: Skin is warm and dry. No rash noted.  Psychiatric:  Has  normal mood and affect. Behavior is normal.     Assessment & Plan:

## 2012-10-27 NOTE — Assessment & Plan Note (Signed)
Ok for low dose limited use xanax prn,  to f/u any worsening symptoms or concerns

## 2012-10-27 NOTE — Patient Instructions (Addendum)
Please take all new medication as prescribed - the alprazolam as needed for nerves Please go to the LAB in the Basement (turn left off the elevator) for the tests to be done today - the liver and hepatitis tests You will be contacted by phone if any changes need to be made immediately.  Otherwise, you will receive a letter about your results with an explanation, but please check with MyChart first. Please continue your efforts at being more active, low cholesterol diet, and weight control. Your EKG was ok Please remember to followup with your GYN for the yearly pap smear and/or mammogram Thank you for enrolling in MyChart. Please follow the instructions below to securely access your online medical record. MyChart allows you to send messages to your doctor, view your test results, renew your prescriptions, schedule appointments, and more. To Log into My Chart online, please go by [employer] or [employer] to [email or web address], or download the MyChart App from the [employer] .  Your Username is: [email or web address] (pass Schneider) Please send a Engineer, water on Mychart later today. Please check your blood pressure on a regular basis, and your goal is to be less than 140/90 Please return in 6 months, or sooner if needed

## 2012-10-27 NOTE — Assessment & Plan Note (Signed)
Mild, I think most likely related to recent n/v/GI illness, but she wants to check f/u lab and hepatitis panel, will order

## 2012-10-27 NOTE — Assessment & Plan Note (Signed)

## 2012-10-28 LAB — HEPATITIS PANEL, ACUTE: Hep B C IgM: NEGATIVE

## 2012-11-11 ENCOUNTER — Other Ambulatory Visit: Payer: Self-pay | Admitting: Internal Medicine

## 2012-11-22 ENCOUNTER — Other Ambulatory Visit: Payer: Self-pay | Admitting: Internal Medicine

## 2012-11-22 NOTE — Telephone Encounter (Signed)
Done hardcopy to robin  

## 2012-11-22 NOTE — Telephone Encounter (Signed)
Faxed hardcopy to pharmacy. 

## 2012-11-23 ENCOUNTER — Telehealth: Payer: Self-pay

## 2012-11-23 MED ORDER — ALPRAZOLAM 0.5 MG PO TABS: 0.5000 mg | ORAL_TABLET | Freq: Two times a day (BID) | ORAL | Status: DC | PRN

## 2012-11-23 NOTE — Telephone Encounter (Signed)
Ok for change to xanax .5 bid prn, Done hardcopy to robin   Robin to cancel refills for the [facility]

## 2012-11-23 NOTE — Telephone Encounter (Signed)
The patient stated her nerve medication is not working well and request something stronger.

## 2012-11-23 NOTE — Telephone Encounter (Signed)
Called the patient informed of change.  Called the pharmacy canceled any refills on previous prescription.  Faxed hardcopy of new strength to pharmacy.

## 2012-12-20 ENCOUNTER — Encounter: Payer: Self-pay | Admitting: Internal Medicine

## 2012-12-20 LAB — HM MAMMOGRAPHY: HM Mammogram: NEGATIVE

## 2013-01-16 ENCOUNTER — Other Ambulatory Visit: Payer: Self-pay | Admitting: Internal Medicine

## 2013-01-18 NOTE — Telephone Encounter (Signed)
Done hardcopy to robin  

## 2013-01-18 NOTE — Telephone Encounter (Signed)
Faxed hardcopy to [facility] [address] [place] 

## 2013-01-26 ENCOUNTER — Other Ambulatory Visit: Payer: Self-pay | Admitting: *Deleted

## 2013-01-26 ENCOUNTER — Ambulatory Visit (INDEPENDENT_AMBULATORY_CARE_PROVIDER_SITE_OTHER): Payer: BC Managed Care – PPO | Admitting: Family Medicine

## 2013-01-26 VITALS — BP 122/82 | HR 75 | Temp 98.1°F | Resp 16 | Ht 65.0 in | Wt 224.0 lb

## 2013-01-26 DIAGNOSIS — M546 Pain in thoracic spine: Secondary | ICD-10-CM

## 2013-01-26 DIAGNOSIS — M5416 Radiculopathy, lumbar region: Secondary | ICD-10-CM

## 2013-01-26 MED ORDER — PREDNISONE 5 MG PO KIT: PACK | ORAL | Status: DC

## 2013-01-26 MED ORDER — KETOROLAC TROMETHAMINE 30 MG/ML IJ SOLN: 30.0000 mg | Freq: Once | INTRAMUSCULAR | Status: DC

## 2013-01-26 MED ORDER — KETOROLAC TROMETHAMINE 30 MG/ML IJ SOLN
30.0000 mg | Freq: Once | INTRAMUSCULAR | Status: AC
  Administered 2013-01-26: 30 mg via INTRAMUSCULAR

## 2013-01-26 NOTE — Patient Instructions (Addendum)
Thank you for coming in today. Please take the prednisone starting tomorrow. If you worsen or do not get better please followup with your Dr. Claudie Fisherman back or go to the emergency room if you notice new weakness new numbness problems walking or bowel or bladder problems.

## 2013-01-26 NOTE — Progress Notes (Signed)
Pamela Gibson is a 47 y.o. female who presents to [facility] today for low back pain with left intermittent radicular pain present for the last several days. Patient does have mild chronic back pain that is managed by her primary care provider with intermittent hydrocodone. She occasionally has back pain flares consistent with this. His typically happen about once a year. She denies any weakness numbness difficulty walking bowel bladder dysfunction. She is taking hydrocodone which helps a bit. No fevers or chills.   PMH: Reviewed obesity History  Substance Use Topics  . Smoking status: Never Smoker   . Smokeless tobacco: Not on file  . Alcohol Use: No   ROS as above  Medications reviewed. Current Outpatient Prescriptions  Medication Sig Dispense Refill  . ALPRAZolam (XANAX) 0.5 MG tablet Take 1 tablet (0.5 mg total) by mouth 2 (two) times daily as needed.  60 tablet  1  . ALPRAZolam (XANAX) 0.5 MG tablet TAKE 1 TABLET BY MOUTH TWICE A DAY AS NEEDED  60 tablet  1  . aspirin 81 MG EC tablet Take 81 mg by mouth daily.        . famotidine (PEPCID) 40 MG tablet Take 40 mg by mouth 2 (two) times daily.      Marland Kitchen HYDROcodone-acetaminophen (NORCO) 7.5-325 MG per tablet TAKE 1 TABLET BY MOUTH 3 TIMES A DAY AS NEEDED FOR PAIN  90 tablet  1  . pravastatin (PRAVACHOL) 40 MG tablet TAKE 1 TABLET (40 MG TOTAL) BY MOUTH DAILY.  90 tablet  3  . sucralfate (CARAFATE) 1 G tablet Take 1 g by mouth 2 (two) times daily.        . PredniSONE 5 MG KIT 6 day kit po  1 kit  0   Current Facility-Administered Medications  Medication Dose Route Frequency Provider Last Rate Last Dose  . ketorolac (TORADOL) 30 MG/ML injection 30 mg  30 mg Intravenous Once Rodolph Bong, MD        Exam:  BP 122/82  Pulse 75  Temp(Src) 98.1 F (36.7 C) (Oral)  Resp 16  Ht 5\' 5"  (1.651 m)  Wt 224 lb (101.606 kg)  BMI 37.28 kg/m2  SpO2 98% Gen: Well NAD BACK: Nontender spinal midline Normal range of motion Strength is intact bilateral  lower extremities. Patient can stand on her heels and toes get on and off exam table easily Normal gait without antalgic limb.  Sensation is intact distally bilateral lower extremities.  No results found for this or any previous visit (from the past 72 hour(s)).  Assessment and Plan: 47 y.o. female with low back pain flare with some radicular pain.  Plan:  Oral prednisone 6 day 5 mg pack Toradol IM 30 mg now Followup with PCP if not improving Discussed warning signs or symptoms. Please see discharge instructions. Patient expresses understanding.

## 2013-03-14 ENCOUNTER — Other Ambulatory Visit: Payer: Self-pay | Admitting: Internal Medicine

## 2013-03-14 NOTE — Telephone Encounter (Signed)
Done hardcopy to robin  

## 2013-03-14 NOTE — Telephone Encounter (Signed)
Faxed hardcopy to [facility] [address] [place] 

## 2013-03-21 ENCOUNTER — Ambulatory Visit (INDEPENDENT_AMBULATORY_CARE_PROVIDER_SITE_OTHER): Payer: BC Managed Care – PPO

## 2013-03-21 DIAGNOSIS — Z111 Encounter for screening for respiratory tuberculosis: Secondary | ICD-10-CM

## 2013-03-23 LAB — TB SKIN TEST: Induration: 0 mm

## 2013-04-18 ENCOUNTER — Other Ambulatory Visit: Payer: Self-pay | Admitting: Internal Medicine

## 2013-04-18 NOTE — Telephone Encounter (Signed)
Faxed hardcopy to [facility] [address] 

## 2013-04-18 NOTE — Telephone Encounter (Signed)
Done hardcopy to robin  

## 2013-05-18 ENCOUNTER — Other Ambulatory Visit: Payer: Self-pay | Admitting: Internal Medicine

## 2013-05-18 NOTE — Telephone Encounter (Signed)
Faxed hardcopy to [facility] [address] [place] 

## 2013-05-18 NOTE — Telephone Encounter (Signed)
Done hardcopy to robin  

## 2013-06-15 ENCOUNTER — Ambulatory Visit (INDEPENDENT_AMBULATORY_CARE_PROVIDER_SITE_OTHER): Payer: BC Managed Care – PPO | Admitting: Internal Medicine

## 2013-06-15 ENCOUNTER — Encounter: Payer: Self-pay | Admitting: Internal Medicine

## 2013-06-15 DIAGNOSIS — M79609 Pain in unspecified limb: Secondary | ICD-10-CM

## 2013-06-15 DIAGNOSIS — M545 Low back pain, unspecified: Secondary | ICD-10-CM

## 2013-06-15 DIAGNOSIS — M79643 Pain in unspecified hand: Secondary | ICD-10-CM | POA: Insufficient documentation

## 2013-06-15 DIAGNOSIS — G8929 Other chronic pain: Secondary | ICD-10-CM

## 2013-06-15 DIAGNOSIS — K219 Gastro-esophageal reflux disease without esophagitis: Secondary | ICD-10-CM

## 2013-06-15 DIAGNOSIS — Z0000 Encounter for general adult medical examination without abnormal findings: Secondary | ICD-10-CM

## 2013-06-15 DIAGNOSIS — M519 Unspecified thoracic, thoracolumbar and lumbosacral intervertebral disc disorder: Secondary | ICD-10-CM | POA: Insufficient documentation

## 2013-06-15 MED ORDER — HYDROCODONE-ACETAMINOPHEN 7.5-325 MG PO TABS: ORAL_TABLET | ORAL | Status: DC

## 2013-06-15 NOTE — Assessment & Plan Note (Signed)
Bilat, likley OA related, for OTC capsiascin prn

## 2013-06-15 NOTE — Assessment & Plan Note (Signed)
stable overall by history and exam, recent data reviewed with pt, and pt to continue medical treatment as before,  to f/u any worsening symptoms or concerns Lab Results  Component Value Date   WBC 9.2 10/19/2012   HGB 14.0 10/19/2012   HCT 41.6 10/19/2012   PLT 256.0 10/19/2012   GLUCOSE 96 10/19/2012   CHOL 110 10/19/2012   TRIG 91.0 10/19/2012   HDL 30.60* 10/19/2012   LDLDIRECT 128.8 07/18/2008   LDLCALC 61 10/19/2012   ALT 35 10/27/2012   AST 22 10/27/2012   NA 138 10/19/2012   K 3.1* 10/19/2012   CL 103 10/19/2012   CREATININE 0.8 10/19/2012   BUN 10 10/19/2012   CO2 26 10/19/2012   TSH 2.07 10/19/2012

## 2013-06-15 NOTE — Assessment & Plan Note (Signed)
Stable, for pain med refill,  to f/u any worsening symptoms or concerns  

## 2013-06-15 NOTE — Progress Notes (Signed)
Subjective:    Patient ID: Pamela Gibson, female    DOB: 02/08/1966, 47 y.o.   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 Pt continues to have recurring LBP without change in severity, bowel or bladder change, fever, wt loss,  worsening LE pain/numbness/weakness, gait change or falls - used to be on 4 pain pills per day in the past, now pain not controlled on three per day - hydrocodone.  Has known lumbar DJD/DDD  Uses her hands for work, does not type, c/o finger joint pain and wrist pain bilat for several wks,.  Had flu shot last wk.  Plans to go to [place] soon. Denies worsening reflux, abd pain, dysphagia, n/v, bowel change or blood. Past Medical History  Diagnosis Date  . HYPERLIPIDEMIA 07/22/2007  . MITRAL VALVE PROLAPSE 07/22/2007  . SINUSITIS- ACUTE-NOS 09/09/2007  . GERD 07/22/2007  . IBS 07/23/2007  . Unspecified Vaginitis and Vulvovaginitis 12/17/2007  . DEGENERATIVE JOINT DISEASE, LUMBAR SPINE 07/23/2007  . BACK PAIN 07/23/2007  . TACHYCARDIA 08/20/2010  . CHEST PAIN 09/04/2010  . Gastric polyp     x3   Past Surgical History  Procedure Laterality Date  . Abdominal hysterectomy    . Tubal ligation    . Cholecystectomy    . Tonsillectomy    . Rle tib/fib fx/surgury      reports that she has never smoked. She does not have any smokeless tobacco history on file. She reports that she does not drink alcohol. Her drug history is not on file. family history includes Angina in her mother; Colon polyps in her mother; Coronary artery disease in her mother; Diabetes in her other; Heart disease in her mother; Hyperlipidemia in an other family member; Hypertension in her other; Stroke in her mother. Allergies  Allergen Reactions  . Atorvastatin     REACTION: more forgetful  . Penicillins      REACTION: turns blue  . Simvastatin     REACTION: more forgetful  . Tetracycline     REACTION: itch   Current Outpatient Prescriptions on File Prior to Visit  Medication Sig Dispense Refill  . ALPRAZolam (XANAX) 0.5 MG tablet TAKE 1 TABLET BY MOUTH TWICE A DAY AS NEEDED  60 tablet  1  . ALPRAZolam (XANAX) 0.5 MG tablet TAKE 1 TABLET BY MOUTH TWICE A DAY AS NEEDED  60 tablet  1  . aspirin 81 MG EC tablet Take 81 mg by mouth daily.        . famotidine (PEPCID) 40 MG tablet Take 40 mg by mouth 2 (two) times daily.      Marland Kitchen HYDROcodone-acetaminophen (NORCO) 7.5-325 MG per tablet TAKE 1 TABLET BY MOUTH 3 TIMES A DAY AS NEEDED FOR PAIN  90 tablet  1  . pravastatin (PRAVACHOL) 40 MG tablet TAKE 1 TABLET (40 MG TOTAL) BY MOUTH DAILY.  90 tablet  3  . PredniSONE 5 MG KIT 6 day kit po  1 kit  0  . sucralfate (CARAFATE) 1 G tablet Take 1 g by mouth 2 (two) times daily.         No current facility-administered medications on file prior to visit.   Review of Systems  Constitutional: Negative for unexpected weight change, or unusual diaphoresis  HENT: Negative for tinnitus.   Eyes: Negative for photophobia and visual  disturbance.  Respiratory: Negative for choking and stridor.   Gastrointestinal: Negative for vomiting and blood in stool.  Genitourinary: Negative for hematuria and decreased urine volume.  Musculoskeletal: Negative for acute joint swelling Skin: Negative for color change and wound.  Neurological: Negative for tremors and numbness other than noted  Psychiatric/Behavioral: Negative for decreased concentration or  hyperactivity.       Objective:   Physical Exam BP 112/80  Pulse 75  Temp(Src) 98.7 F (37.1 C) (Oral)  Ht 5\' 5"  (1.651 m)  Wt 221 lb (100.245 kg)  BMI 36.78 kg/m2  SpO2 97% VS noted,  Constitutional: Pt appears well-developed and well-nourished.  HENT: Head: NCAT.  Right Ear: External ear normal.  Left Ear: External ear normal.  Eyes: Conjunctivae and EOM are  normal. Pupils are equal, round, and reactive to light.  Neck: Normal range of motion. Neck supple.  Cardiovascular: Normal rate and regular rhythm.   Pulmonary/Chest: Effort normal and breath sounds normal.  Abd:  Soft, NT, non-distended, + BS Neurological: Pt is alert. Not confused , motor 5/5 Hands with several OA type bony joint changes Skin: Skin is warm. No erythema.  Psychiatric: Pt behavior is normal. Thought content normal.     Assessment & Plan:

## 2013-06-15 NOTE — Patient Instructions (Signed)
Please continue all other medications as before, and refills have been done if requested. Please have the pharmacy call with any other refills you may need. Please consider using Capsaicin OTC for the hand pain  Please remember to sign up for My Chart if you have not done so, as this will be important to you in the future with finding out test results, communicating by private email, and scheduling acute appointments online when needed.  Please return in 6 months, or sooner if needed, with Lab testing done 3-5 days before

## 2013-06-30 ENCOUNTER — Other Ambulatory Visit: Payer: Self-pay

## 2013-07-14 ENCOUNTER — Other Ambulatory Visit: Payer: Self-pay | Admitting: Internal Medicine

## 2013-07-15 NOTE — Telephone Encounter (Signed)
Done hardcopy to robin  

## 2013-07-15 NOTE — Telephone Encounter (Signed)
[facility]  

## 2013-08-10 ENCOUNTER — Ambulatory Visit (INDEPENDENT_AMBULATORY_CARE_PROVIDER_SITE_OTHER): Payer: BC Managed Care – PPO | Admitting: Emergency Medicine

## 2013-08-10 VITALS — BP 128/86 | HR 84 | Temp 98.1°F | Resp 16 | Ht 65.5 in | Wt 217.6 lb

## 2013-08-10 DIAGNOSIS — J029 Acute pharyngitis, unspecified: Secondary | ICD-10-CM

## 2013-08-10 LAB — POCT INFLUENZA A/B
Influenza A, POC: NEGATIVE
Influenza B, POC: NEGATIVE

## 2013-08-10 LAB — POCT RAPID STREP A (OFFICE): Rapid Strep A Screen: NEGATIVE

## 2013-08-10 MED ORDER — FIRST-DUKES MOUTHWASH MT SUSP: OROMUCOSAL | Status: DC

## 2013-08-10 NOTE — Patient Instructions (Signed)
Sore Throat A sore throat is pain, burning, irritation, or scratchiness of the throat. There is often pain or tenderness when swallowing or talking. A sore throat may be accompanied by other symptoms, such as coughing, sneezing, fever, and swollen neck glands. A sore throat is often the first sign of another sickness, such as a cold, flu, strep throat, or mononucleosis (commonly known as mono). Most sore throats go away without medical treatment. CAUSES  The most common causes of a sore throat include:  A viral infection, such as a cold, flu, or mono.  A bacterial infection, such as strep throat, tonsillitis, or whooping cough.  Seasonal allergies.  Dryness in the air.  Irritants, such as smoke or pollution.  Gastroesophageal reflux disease (GERD). HOME CARE INSTRUCTIONS   Only take over-the-counter medicines as directed by your caregiver.  Drink enough fluids to keep your urine clear or pale yellow.  Rest as needed.  Try using throat sprays, lozenges, or sucking on hard candy to ease any pain (if older than 4 years or as directed).  Sip warm liquids, such as broth, herbal tea, or warm water with honey to relieve pain temporarily. You may also eat or drink cold or frozen liquids such as frozen ice pops.  Gargle with salt water (mix 1 tsp salt with 8 oz of water).  Do not smoke and avoid secondhand smoke.  Put a cool-mist humidifier in your bedroom at night to moisten the air. You can also turn on a hot shower and sit in the bathroom with the door closed for 5 10 minutes. SEEK IMMEDIATE MEDICAL CARE IF:  You have difficulty breathing.  You are unable to swallow fluids, soft foods, or your saliva.  You have increased swelling in the throat.  Your sore throat does not get better in 7 days.  You have nausea and vomiting.  You have a fever or persistent symptoms for more than 2 3 days.  You have a fever and your symptoms suddenly get worse. MAKE SURE YOU:   Understand  these instructions.  Will watch your condition.  Will get help right away if you are not doing well or get worse. Document Released: 09/18/2004 Document Revised: 07/28/2012 Document Reviewed: 04/18/2012 [employer] Patient Information 2014 [employer].  

## 2013-08-10 NOTE — Progress Notes (Addendum)
Subjective:    Patient ID: Pamela Gibson, female    DOB: [birth year], 47 y.o.   MRN: [id number]  This chart was scribed for Lesle Chris, MD by Blanchard Kelch, ED Scribe. The patient was seen in room 10. Patient's care was started at 8:29 AM.   HPI  HPI Comments: Pamela Gibson is a 47 y.o. female who presents to the office complaining of a constant, worsening sore throat that began a few days ago. She describes the pain in her throat as "scratchy." She complains of associated cervical lymphadenopathy, post nasal drainage, rhinorrhea with clear discharge, bilateral ear pain and dry cough. She denies myalgias or subjective fever. She reports getting her flu vaccination this season.   Past Medical History  Diagnosis Date  . HYPERLIPIDEMIA 07/22/2007  . MITRAL VALVE PROLAPSE 07/22/2007  . SINUSITIS- ACUTE-NOS 09/09/2007  . GERD 07/22/2007  . IBS 07/23/2007  . Unspecified Vaginitis and Vulvovaginitis 12/17/2007  . DEGENERATIVE JOINT DISEASE, LUMBAR SPINE 07/23/2007  . BACK PAIN 07/23/2007  . TACHYCARDIA 08/20/2010  . CHEST PAIN 09/04/2010  . Gastric polyp     x3   Past Surgical History  Procedure Laterality Date  . Abdominal hysterectomy    . Tubal ligation    . Cholecystectomy    . Tonsillectomy    . Rle tib/fib fx/surgury     Family History  Problem Relation Age of Onset  . Colon polyps Mother   . Angina Mother   . Heart disease Mother     CVA  . Coronary artery disease Mother   . Stroke Mother   . Hypertension Other   . Diabetes Other   . Hyperlipidemia     Current Outpatient Prescriptions on File Prior to Visit  Medication Sig Dispense Refill  . ALPRAZolam (XANAX) 0.5 MG tablet TAKE 1 TABLET BY MOUTH TWICE A DAY AS NEEDED  60 tablet  1  . aspirin 81 MG EC tablet Take 81 mg by mouth daily.        . famotidine (PEPCID) 40 MG tablet Take 40 mg by mouth 2 (two) times daily.      Marland Kitchen HYDROcodone-acetaminophen (NORCO) 7.5-325 MG per tablet TAKE 1 TABLET BY MOUTH 4 TIMES A  DAY AS NEEDED FOR PAIN - to fill Aug 14, 2013  120 tablet  0  . pravastatin (PRAVACHOL) 40 MG tablet TAKE 1 TABLET (40 MG TOTAL) BY MOUTH DAILY.  90 tablet  3  . sucralfate (CARAFATE) 1 G tablet Take 1 g by mouth 2 (two) times daily.         No current facility-administered medications on file prior to visit.   Allergies  Allergen Reactions  . Atorvastatin     REACTION: more forgetful  . Penicillins     REACTION: turns blue  . Simvastatin     REACTION: more forgetful  . Tetracycline     REACTION: itch     Review of Systems  Constitutional: Negative for fever.  HENT: Positive for ear pain, postnasal drip, rhinorrhea and sore throat. Negative for drooling.   Eyes: Negative for discharge.  Respiratory: Positive for cough.   Cardiovascular: Negative for leg swelling.  Gastrointestinal: Negative for vomiting.  Endocrine: Negative for polyuria.  Genitourinary: Negative for hematuria.  Musculoskeletal: Negative for gait problem and myalgias.  Skin: Negative for rash.  Allergic/Immunologic: Negative for immunocompromised state.  Neurological: Negative for speech difficulty.  Hematological: Positive for adenopathy.  Psychiatric/Behavioral: Negative for confusion.      Objective:  Physical Exam  Nursing note and vitals reviewed. General: Well-developed, well-nourished female in no acute distress; appearance consistent with age of record HENT: normocephalic; atraumatic  Ears: left TM normal; right TM normal  Nose: nasal discharge  Throat: The patient has had a tonsillectomy. There is only minimal redness of the posterior pharynx  Eyes: pupils equal, round and reactive to light; extraocular muscles intact Neck: supple; normal range of motion Heart: regular rate and rhythm; no murmurs, rubs or gallops Lungs: clear to auscultation bilaterally Abdomen: soft; nondistended; nontender; no masses or hepatosplenomegaly; bowel sounds present Extremities: No deformity; full range of  motion; pulses normal Neurologic: Awake, alert and oriented; motor function intact in all extremities and symmetric; no facial droop Skin: Warm and dry Psychiatric: Normal mood and affect  Results for orders placed in visit on 08/10/13  POCT RAPID STREP A (OFFICE)      Result Value Range   Rapid Strep A Screen Negative  Negative  POCT INFLUENZA A/B      Result Value Range   Influenza A, POC Negative     Influenza B, POC Negative          Assessment & Plan:  Illness  appears to be viral. We'll give some mouthwash for symptomatic relief advised Tylenol .   I personally performed the services described in this documentation, which was scribed in my presence. The recorded information has been reviewed and is accurate.

## 2013-08-12 LAB — CULTURE, GROUP A STREP

## 2013-08-13 ENCOUNTER — Telehealth: Payer: Self-pay

## 2013-08-13 NOTE — Telephone Encounter (Signed)
Pt states was in earlier in the week, saw dr Cleta Alberts, pt states now having green drainage and [facility] [address]

## 2013-08-13 NOTE — Telephone Encounter (Signed)
Okay to call in a Z-Pak for the purulent nasal drainage.

## 2013-08-15 MED ORDER — AZITHROMYCIN 250 MG PO TABS: ORAL_TABLET | ORAL | Status: DC

## 2013-08-15 NOTE — Telephone Encounter (Signed)
Called her, to advise. Sent in zpack

## 2013-09-06 ENCOUNTER — Telehealth: Payer: Self-pay

## 2013-09-06 MED ORDER — HYDROCODONE-ACETAMINOPHEN 7.5-325 MG PO TABS: ORAL_TABLET | ORAL | Status: DC

## 2013-09-06 NOTE — Telephone Encounter (Signed)
Done hardcopy to robin - to also let pt know  You are given the letter today explaining the transitional pain medication refill policy due to recent change in [place] law and [place] regulations  Please be aware that I will no longer be able to offer monthly refills of any Schedule II or higher medication starting Sep 25, 2013

## 2013-09-06 NOTE — Telephone Encounter (Signed)
The patient is in need of her pain meds refilled.   Callback - [phone]

## 2013-09-07 NOTE — Telephone Encounter (Signed)
Called the patient informed hardcopy's are ready for pickup at the front desk and explained enclosed letter regarding pain medication change in refill policy.

## 2013-09-17 ENCOUNTER — Other Ambulatory Visit: Payer: Self-pay | Admitting: Internal Medicine

## 2013-09-19 NOTE — Telephone Encounter (Signed)
Md out pls advise on refills...Johny Chess

## 2013-09-19 NOTE — Telephone Encounter (Signed)
[facility].../lmb 

## 2013-09-21 ENCOUNTER — Ambulatory Visit (INDEPENDENT_AMBULATORY_CARE_PROVIDER_SITE_OTHER): Payer: BC Managed Care – PPO | Admitting: Physician Assistant

## 2013-09-21 VITALS — BP 134/82 | HR 88 | Temp 98.2°F | Resp 16 | Ht 66.5 in | Wt 223.0 lb

## 2013-09-21 DIAGNOSIS — M778 Other enthesopathies, not elsewhere classified: Secondary | ICD-10-CM

## 2013-09-21 DIAGNOSIS — M6580 Other synovitis and tenosynovitis, unspecified site: Secondary | ICD-10-CM

## 2013-09-21 DIAGNOSIS — L989 Disorder of the skin and subcutaneous tissue, unspecified: Secondary | ICD-10-CM

## 2013-09-21 MED ORDER — MELOXICAM 15 MG PO TABS: 15.0000 mg | ORAL_TABLET | Freq: Every day | ORAL | Status: DC

## 2013-09-21 NOTE — Patient Instructions (Signed)
If you have not heard anything regarding the referral in 1 week, please contact our office.

## 2013-09-21 NOTE — Progress Notes (Signed)
   Subjective:    Patient ID: Pamela Gibson, female    DOB: [birth year], 48 y.o.   MRN: [id number]  HPI  Patient presents with right arm pain starting 3 weeks ago.  Started in the elbow and is now radiating down arm to tip of right index finger. Describes pain as an aching, soreness that is worse at night.  Noticed a small nodule in the antecubital fossa of right arm which has gone away. Now there is small nodule at base of right index finger.  Denies injury, erythema and ecchymosis. Denies numbness and tingling in right arm and right hand.  Denies fever and chills.  Has not taken anything for pain.   Review of Systems As above.     Objective:   Physical Exam  Constitutional: She is oriented to person, place, and time. She appears well-developed and well-nourished.  HENT:  Head: Normocephalic and atraumatic.  Neck: Neck supple.  Cardiovascular: Normal rate and regular rhythm.   Pulmonary/Chest: Effort normal and breath sounds normal.  Musculoskeletal:       Right shoulder: Normal.       Right elbow: She exhibits normal range of motion, no swelling and no effusion. Tenderness found.       Left elbow: Normal.       Right wrist: She exhibits normal range of motion, no tenderness and no swelling.       Right forearm: Normal.       Hands: Neurological: She is alert and oriented to person, place, and time.  Skin: Skin is warm and dry. No rash noted.  Psychiatric: She has a normal mood and affect. Her behavior is normal. Judgment and thought content normal.        Assessment & Plan:    1. Tendonitis of elbow, right Take Meloxicam once daily with dinner. Do not take ibuprofen while taking medication. If pain does not resolve, return to [facility] or follow up with PCP.  - meloxicam (MOBIC) 15 MG tablet; Take 1 tablet (15 mg total) by mouth daily.  Dispense: 30 tablet; Refill: 0  2. Finger lesion Hand specialist will call to schedule appointment. If lesion resolves before appointment,  pt may cancel.  - Ambulatory referral to Hand Surgery

## 2013-09-22 NOTE — Progress Notes (Signed)
I have examined this patient along with the student and agree.  Suspect the elbow/arm pain is lateral epicondylitis.  THe finger lesion is somewhat unusual.  It has the appearance similar to a varicosity, but quite firm on palpation and I wonder if it is a ganglion cyst or tendon associated lesion.

## 2013-11-08 ENCOUNTER — Other Ambulatory Visit: Payer: Self-pay | Admitting: Internal Medicine

## 2013-11-16 ENCOUNTER — Other Ambulatory Visit: Payer: Self-pay | Admitting: Internal Medicine

## 2013-11-16 NOTE — Telephone Encounter (Signed)
[facility].../lmb 

## 2013-11-18 ENCOUNTER — Other Ambulatory Visit (INDEPENDENT_AMBULATORY_CARE_PROVIDER_SITE_OTHER): Payer: BC Managed Care – PPO

## 2013-11-18 DIAGNOSIS — Z0000 Encounter for general adult medical examination without abnormal findings: Secondary | ICD-10-CM

## 2013-11-18 LAB — LIPID PANEL
CHOL/HDL RATIO: 4
Cholesterol: 144 mg/dL (ref 0–200)
HDL: 39 mg/dL — ABNORMAL LOW (ref >39.00)
LDL Cholesterol: 74 mg/dL (ref 0–99)
TRIGLYCERIDES: 157 mg/dL — AB (ref 0.0–149.0)
VLDL: 31.4 mg/dL (ref 0.0–40.0)

## 2013-11-18 LAB — HEPATIC FUNCTION PANEL
ALBUMIN: 3.9 g/dL (ref 3.5–5.2)
ALT: 26 U/L (ref 0–35)
AST: 20 U/L (ref 0–37)
Alkaline Phosphatase: 44 U/L (ref 39–117)
Bilirubin, Direct: 0.1 mg/dL (ref 0.0–0.3)
Total Bilirubin: 0.7 mg/dL (ref 0.3–1.2)
Total Protein: 6.8 g/dL (ref 6.0–8.3)

## 2013-11-18 LAB — URINALYSIS, ROUTINE W REFLEX MICROSCOPIC
Bilirubin Urine: NEGATIVE
KETONES UR: NEGATIVE
LEUKOCYTES UA: NEGATIVE
NITRITE: NEGATIVE
Specific Gravity, Urine: 1.01 (ref 1.000–1.030)
Total Protein, Urine: NEGATIVE
UROBILINOGEN UA: 0.2 (ref 0.0–1.0)
Urine Glucose: NEGATIVE
WBC, UA: NONE SEEN (ref 0–?)
pH: 5.5 (ref 5.0–8.0)

## 2013-11-18 LAB — CBC WITH DIFFERENTIAL/PLATELET
BASOS ABS: 0.1 10*3/uL (ref 0.0–0.1)
Basophils Relative: 0.6 % (ref 0.0–3.0)
Eosinophils Absolute: 0.2 10*3/uL (ref 0.0–0.7)
Eosinophils Relative: 2 % (ref 0.0–5.0)
HEMATOCRIT: 44.3 % (ref 36.0–46.0)
Hemoglobin: 15.1 g/dL — ABNORMAL HIGH (ref 12.0–15.0)
LYMPHS ABS: 3.4 10*3/uL (ref 0.7–4.0)
LYMPHS PCT: 36.5 % (ref 12.0–46.0)
MCHC: 34 g/dL (ref 30.0–36.0)
MCV: 89.3 fl (ref 78.0–100.0)
MONOS PCT: 9.5 % (ref 3.0–12.0)
Monocytes Absolute: 0.9 10*3/uL (ref 0.1–1.0)
Neutro Abs: 4.8 10*3/uL (ref 1.4–7.7)
Neutrophils Relative %: 51.4 % (ref 43.0–77.0)
Platelets: 279 10*3/uL (ref 150.0–400.0)
RBC: 4.96 Mil/uL (ref 3.87–5.11)
RDW: 12.5 % (ref 11.5–14.6)
WBC: 9.3 10*3/uL (ref 4.5–10.5)

## 2013-11-18 LAB — BASIC METABOLIC PANEL
BUN: 10 mg/dL (ref 6–23)
CO2: 26 meq/L (ref 19–32)
Calcium: 9.3 mg/dL (ref 8.4–10.5)
Chloride: 105 mEq/L (ref 96–112)
Creatinine, Ser: 0.9 mg/dL (ref 0.4–1.2)
GFR: 73.71 mL/min (ref >60.00)
GLUCOSE: 89 mg/dL (ref 70–99)
POTASSIUM: 3.9 meq/L (ref 3.5–5.1)
SODIUM: 137 meq/L (ref 135–145)

## 2013-11-18 LAB — TSH: TSH: 2.31 u[IU]/mL (ref 0.35–5.50)

## 2013-12-02 ENCOUNTER — Ambulatory Visit (INDEPENDENT_AMBULATORY_CARE_PROVIDER_SITE_OTHER): Payer: BC Managed Care – PPO | Admitting: Internal Medicine

## 2013-12-02 ENCOUNTER — Encounter: Payer: Self-pay | Admitting: Internal Medicine

## 2013-12-02 VITALS — BP 110/80 | HR 78 | Temp 98.3°F | Wt 224.5 lb

## 2013-12-02 DIAGNOSIS — G894 Chronic pain syndrome: Secondary | ICD-10-CM

## 2013-12-02 DIAGNOSIS — Z0000 Encounter for general adult medical examination without abnormal findings: Secondary | ICD-10-CM

## 2013-12-02 DIAGNOSIS — Z23 Encounter for immunization: Secondary | ICD-10-CM

## 2013-12-02 MED ORDER — HYDROCODONE-ACETAMINOPHEN 7.5-325 MG PO TABS: ORAL_TABLET | ORAL | Status: DC

## 2013-12-02 NOTE — Progress Notes (Signed)
Pre visit review using our clinic review tool, if applicable. No additional management support is needed unless otherwise documented below in the visit note. 

## 2013-12-02 NOTE — Assessment & Plan Note (Signed)

## 2013-12-02 NOTE — Addendum Note (Signed)
Addended by: Sharon Seller B on: 12/02/2013 09:55 AM   Modules accepted: Orders

## 2013-12-02 NOTE — Patient Instructions (Addendum)
You had the tetanus update shot today (Tdap)  Please continue all other medications as before, and refills have been done if requested, including the pain medications for now  Please be aware that I will no longer be able to offer monthly refills of any Schedule II or higher medication  You will be contacted regarding the referral for: pain clinic  Please have the pharmacy call with any other refills you may need.  Please continue your efforts at being more active, low cholesterol diet, and weight control. You are otherwise up to date with prevention measures today.  Your Blood Work and EKG were ok  Please return in 1 year for your yearly visit, or sooner if needed, with Lab testing done 3-5 days before

## 2013-12-02 NOTE — Progress Notes (Signed)
Subjective:    Patient ID: Pamela Gibson, female    DOB: 10/19/1965, 48 y.o.   MRN: [id number]  HPI  Here for wellness and f/u;  Overall doing ok;  Pt denies CP, worsening SOB, DOE, wheezing, orthopnea, PND, worsening LE edema, palpitations, dizziness or syncope.  Pt denies neurological change such as new headache, facial or extremity weakness.  Pt denies polydipsia, polyuria, or low sugar symptoms. Pt states overall good compliance with treatment and medications, good tolerability, and has been trying to follow lower cholesterol diet.  Pt denies worsening depressive symptoms, suicidal ideation or panic. No fever, night sweats, wt loss, loss of appetite, or other constitutional symptoms.  Pt states good ability with ADL's, has low fall risk, home safety reviewed and adequate, no other significant changes in hearing or vision, and only occasionally active with exercise. Pt continues to have recurring LBP without change in severity, bowel or bladder change, fever, wt loss,  worsening LE pain/numbness/weakness, gait change or falls. Past Medical History  Diagnosis Date  . HYPERLIPIDEMIA 07/22/2007  . MITRAL VALVE PROLAPSE 07/22/2007  . SINUSITIS- ACUTE-NOS 09/09/2007  . GERD 07/22/2007  . IBS 07/23/2007  . Unspecified Vaginitis and Vulvovaginitis 12/17/2007  . DEGENERATIVE JOINT DISEASE, LUMBAR SPINE 07/23/2007  . BACK PAIN 07/23/2007  . TACHYCARDIA 08/20/2010  . CHEST PAIN 09/04/2010  . Gastric polyp     x3   Past Surgical History  Procedure Laterality Date  . Abdominal hysterectomy    . Tubal ligation    . Cholecystectomy    . Tonsillectomy    . Rle tib/fib fx/surgury      reports that she has never smoked. She does not have any smokeless tobacco history on file. She reports that she does not drink alcohol. Her drug history is not on file. family history includes Angina in her mother; Colon polyps in her mother; Coronary artery disease in her mother; Diabetes in her other; Heart disease  in her mother; Hyperlipidemia in an other family member; Hypertension in her other; Stroke in her mother. Allergies  Allergen Reactions  . Atorvastatin     REACTION: more forgetful  . Penicillins     REACTION: turns blue  . Simvastatin     REACTION: more forgetful  . Tetracycline     REACTION: itch   Current Outpatient Prescriptions on File Prior to Visit  Medication Sig Dispense Refill  . ALPRAZolam (XANAX) 0.5 MG tablet TAKE 1 TABLET BY MOUTH TWICE A DAY AS NEEDED  60 tablet  1  . aspirin 81 MG EC tablet Take 81 mg by mouth daily.        . famotidine (PEPCID) 40 MG tablet Take 40 mg by mouth 2 (two) times daily.      Marland Kitchen HYDROcodone-acetaminophen (NORCO) 7.5-325 MG per tablet TAKE 1 TABLET BY MOUTH 4 TIMES A DAY AS NEEDED FOR PAIN - to fill Nov 05, 2013  120 tablet  0  . meloxicam (MOBIC) 15 MG tablet Take 1 tablet (15 mg total) by mouth daily.  30 tablet  0  . pravastatin (PRAVACHOL) 40 MG tablet TAKE 1 TABLET BY MOUTH DAILY  90 tablet  3  . sucralfate (CARAFATE) 1 G tablet Take 1 g by mouth 2 (two) times daily.         No current facility-administered medications on file prior to visit.   Review of Systems Constitutional: Negative for diaphoresis, activity change, appetite change or unexpected weight change.  HENT: Negative for hearing loss, ear pain,  facial swelling, mouth sores and neck stiffness.   Eyes: Negative for pain, redness and visual disturbance.  Respiratory: Negative for shortness of breath and wheezing.   Cardiovascular: Negative for chest pain and palpitations.  Gastrointestinal: Negative for diarrhea, blood in stool, abdominal distention or other pain Genitourinary: Negative for hematuria, flank pain or change in urine volume.  Musculoskeletal: Negative for myalgias and joint swelling.  Skin: Negative for color change and wound.  Neurological: Negative for syncope and numbness. other than noted Hematological: Negative for adenopathy.  Psychiatric/Behavioral:  Negative for hallucinations, self-injury, decreased concentration and agitation.      Objective:   Physical Exam BP 110/80  Pulse 78  Temp(Src) 98.3 F (36.8 C) (Oral)  Wt 224 lb 8 oz (101.833 kg)  SpO2 97% VS noted,  Constitutional: Pt is oriented to person, place, and time. Appears well-developed and well-nourished.  Head: Normocephalic and atraumatic.  Right Ear: External ear normal.  Left Ear: External ear normal.  Nose: Nose normal.  Mouth/Throat: Oropharynx is clear and moist.  Eyes: Conjunctivae and EOM are normal. Pupils are equal, round, and reactive to light.  Neck: Normal range of motion. Neck supple. No JVD present. No tracheal deviation present.  Cardiovascular: Normal rate, regular rhythm, normal heart sounds and intact distal pulses.   Pulmonary/Chest: Effort normal and breath sounds normal.  Abdominal: Soft. Bowel sounds are normal. There is no tenderness. No HSM  Musculoskeletal: Normal range of motion. Exhibits no edema.  Lymphadenopathy:  Has no cervical adenopathy.  Neurological: Pt is alert and oriented to person, place, and time. Pt has normal reflexes. No cranial nerve deficit.  Skin: Skin is warm and dry. No rash noted.  Psychiatric:  Has  normal mood and affect. Behavior is normal.     Assessment & Plan:

## 2013-12-14 ENCOUNTER — Ambulatory Visit: Payer: BC Managed Care – PPO | Admitting: Internal Medicine

## 2014-02-11 ENCOUNTER — Other Ambulatory Visit: Payer: Self-pay | Admitting: Internal Medicine

## 2014-02-13 NOTE — Telephone Encounter (Signed)
Done hardcopy to robin  

## 2014-02-14 NOTE — Telephone Encounter (Signed)
Faxed [facility] to [facility] [address] [place]

## 2014-03-17 ENCOUNTER — Telehealth: Payer: Self-pay | Admitting: Internal Medicine

## 2014-03-17 NOTE — Telephone Encounter (Signed)
Informed pt of no further narcotics.   Is there another pain clinic that we can refer her to.

## 2014-03-17 NOTE — Telephone Encounter (Signed)
I d/w pt at last visit, I no longer prescribe medication for chronic pain  I can refer to pain clinic if she wishes

## 2014-03-17 NOTE — Telephone Encounter (Signed)
i clearly told her I would not prescribe further narcotic at last visit  I have nothing else to offer at this time

## 2014-03-17 NOTE — Telephone Encounter (Signed)
Pt request refill for hydrocodone, pt going to run out tomorrow. Please advise.

## 2014-03-17 NOTE — Telephone Encounter (Signed)
Notified pt with md response. Pt states she was contacted by our [facility] pain center will not see her because they stated she does not meet their criteria, and they have to find somebody that will take her.  It's been 3 months & still haven't been set up, and she is out of meds...Johny Chess

## 2014-03-17 NOTE — Telephone Encounter (Signed)
Most likely, i will forward to our [facility]

## 2014-03-20 ENCOUNTER — Telehealth: Payer: Self-pay | Admitting: Internal Medicine

## 2014-03-20 NOTE — Telephone Encounter (Signed)
Patient is requesting TB Test.  Please advise if we can schedule.  Thanks!

## 2014-03-20 NOTE — Telephone Encounter (Signed)
Scheduled for 03/21/2014 at 9am

## 2014-03-20 NOTE — Telephone Encounter (Signed)
That is fine! Can not schedule on Thurs...Pamela Gibson

## 2014-03-21 ENCOUNTER — Ambulatory Visit (INDEPENDENT_AMBULATORY_CARE_PROVIDER_SITE_OTHER): Payer: BC Managed Care – PPO | Admitting: *Deleted

## 2014-03-21 DIAGNOSIS — Z111 Encounter for screening for respiratory tuberculosis: Secondary | ICD-10-CM

## 2014-03-23 ENCOUNTER — Encounter: Payer: Self-pay | Admitting: *Deleted

## 2014-03-23 ENCOUNTER — Encounter: Payer: Self-pay | Admitting: Internal Medicine

## 2014-03-23 LAB — TB SKIN TEST
INDURATION: 0 mm
TB Skin Test: NEGATIVE

## 2014-04-27 ENCOUNTER — Other Ambulatory Visit: Payer: Self-pay | Admitting: Internal Medicine

## 2014-04-27 MED ORDER — ALPRAZOLAM 0.5 MG PO TABS: ORAL_TABLET | ORAL | Status: DC

## 2014-04-27 NOTE — Telephone Encounter (Signed)
Done hardcopy to robin  

## 2014-04-27 NOTE — Telephone Encounter (Signed)
Did not receive hardcopy

## 2014-04-28 NOTE — Telephone Encounter (Signed)
Faxed hardcopy to [place] (Alprazolam)

## 2014-06-30 ENCOUNTER — Emergency Department (HOSPITAL_COMMUNITY): Payer: BC Managed Care – PPO

## 2014-06-30 ENCOUNTER — Emergency Department (HOSPITAL_COMMUNITY)
Admission: EM | Admit: 2014-06-30 | Discharge: 2014-06-30 | Disposition: A | Discharge status: Home / Self Care | Payer: BC Managed Care – PPO | Attending: Emergency Medicine | Admitting: Emergency Medicine

## 2014-06-30 ENCOUNTER — Encounter (HOSPITAL_COMMUNITY): Payer: Self-pay | Admitting: Emergency Medicine

## 2014-06-30 DIAGNOSIS — Z9049 Acquired absence of other specified parts of digestive tract: Secondary | ICD-10-CM | POA: Insufficient documentation

## 2014-06-30 DIAGNOSIS — Z8709 Personal history of other diseases of the respiratory system: Secondary | ICD-10-CM | POA: Insufficient documentation

## 2014-06-30 DIAGNOSIS — Z880 Allergy status to penicillin: Secondary | ICD-10-CM | POA: Diagnosis not present

## 2014-06-30 DIAGNOSIS — Z8679 Personal history of other diseases of the circulatory system: Secondary | ICD-10-CM | POA: Diagnosis not present

## 2014-06-30 DIAGNOSIS — Z90710 Acquired absence of both cervix and uterus: Secondary | ICD-10-CM | POA: Diagnosis not present

## 2014-06-30 DIAGNOSIS — M479 Spondylosis, unspecified: Secondary | ICD-10-CM | POA: Insufficient documentation

## 2014-06-30 DIAGNOSIS — Z86010 Personal history of colonic polyps: Secondary | ICD-10-CM | POA: Insufficient documentation

## 2014-06-30 DIAGNOSIS — Z791 Long term (current) use of non-steroidal anti-inflammatories (NSAID): Secondary | ICD-10-CM | POA: Diagnosis not present

## 2014-06-30 DIAGNOSIS — N83201 Unspecified ovarian cyst, right side: Secondary | ICD-10-CM

## 2014-06-30 DIAGNOSIS — N8320 Unspecified ovarian cysts: Secondary | ICD-10-CM | POA: Diagnosis not present

## 2014-06-30 DIAGNOSIS — N12 Tubulo-interstitial nephritis, not specified as acute or chronic: Secondary | ICD-10-CM | POA: Diagnosis not present

## 2014-06-30 DIAGNOSIS — E785 Hyperlipidemia, unspecified: Secondary | ICD-10-CM | POA: Insufficient documentation

## 2014-06-30 DIAGNOSIS — R109 Unspecified abdominal pain: Secondary | ICD-10-CM

## 2014-06-30 DIAGNOSIS — Z9851 Tubal ligation status: Secondary | ICD-10-CM | POA: Diagnosis not present

## 2014-06-30 DIAGNOSIS — K219 Gastro-esophageal reflux disease without esophagitis: Secondary | ICD-10-CM | POA: Insufficient documentation

## 2014-06-30 DIAGNOSIS — R52 Pain, unspecified: Secondary | ICD-10-CM

## 2014-06-30 DIAGNOSIS — Z79899 Other long term (current) drug therapy: Secondary | ICD-10-CM | POA: Insufficient documentation

## 2014-06-30 LAB — COMPREHENSIVE METABOLIC PANEL
ALBUMIN: 3.8 g/dL (ref 3.5–5.2)
ALT: 34 U/L (ref 0–35)
AST: 27 U/L (ref 0–37)
Alkaline Phosphatase: 56 U/L (ref 39–117)
Anion gap: 13 (ref 5–15)
BUN: 10 mg/dL (ref 6–23)
CALCIUM: 9.4 mg/dL (ref 8.4–10.5)
CO2: 24 mEq/L (ref 19–32)
CREATININE: 0.86 mg/dL (ref 0.50–1.10)
Chloride: 102 mEq/L (ref 96–112)
GFR calc Af Amer: >90 mL/min (ref >90)
GFR, EST NON AFRICAN AMERICAN: 78 mL/min — AB (ref >90)
Glucose, Bld: 112 mg/dL — ABNORMAL HIGH (ref 70–99)
Potassium: 4.4 mEq/L (ref 3.7–5.3)
SODIUM: 139 meq/L (ref 137–147)
Total Bilirubin: 0.7 mg/dL (ref 0.3–1.2)
Total Protein: 7.3 g/dL (ref 6.0–8.3)

## 2014-06-30 LAB — URINE MICROSCOPIC-ADD ON

## 2014-06-30 LAB — URINALYSIS, ROUTINE W REFLEX MICROSCOPIC
Bilirubin Urine: NEGATIVE
Glucose, UA: NEGATIVE mg/dL
KETONES UR: NEGATIVE mg/dL
Nitrite: NEGATIVE
PROTEIN: NEGATIVE mg/dL
Specific Gravity, Urine: 1.006 (ref 1.005–1.030)
Urobilinogen, UA: 1 mg/dL (ref 0.0–1.0)
pH: 7.5 (ref 5.0–8.0)

## 2014-06-30 LAB — CBC WITH DIFFERENTIAL/PLATELET
BASOS ABS: 0 10*3/uL (ref 0.0–0.1)
BASOS PCT: 0 % (ref 0–1)
EOS ABS: 0.1 10*3/uL (ref 0.0–0.7)
EOS PCT: 1 % (ref 0–5)
HEMATOCRIT: 46.1 % — AB (ref 36.0–46.0)
Hemoglobin: 15.9 g/dL — ABNORMAL HIGH (ref 12.0–15.0)
Lymphocytes Relative: 24 % (ref 12–46)
Lymphs Abs: 2.6 10*3/uL (ref 0.7–4.0)
MCH: 30.6 pg (ref 26.0–34.0)
MCHC: 34.5 g/dL (ref 30.0–36.0)
MCV: 88.8 fL (ref 78.0–100.0)
MONO ABS: 0.9 10*3/uL (ref 0.1–1.0)
Monocytes Relative: 8 % (ref 3–12)
Neutro Abs: 7.1 10*3/uL (ref 1.7–7.7)
Neutrophils Relative %: 67 % (ref 43–77)
Platelets: 256 10*3/uL (ref 150–400)
RBC: 5.19 MIL/uL — ABNORMAL HIGH (ref 3.87–5.11)
RDW: 12.8 % (ref 11.5–15.5)
WBC: 10.8 10*3/uL — ABNORMAL HIGH (ref 4.0–10.5)

## 2014-06-30 MED ORDER — MORPHINE SULFATE 4 MG/ML IJ SOLN
4.0000 mg | Freq: Once | INTRAMUSCULAR | Status: AC
  Administered 2014-06-30: 4 mg via INTRAVENOUS
  Filled 2014-06-30: qty 1

## 2014-06-30 MED ORDER — CLINDAMYCIN PHOSPHATE 600 MG/50ML IV SOLN
600.0000 mg | Freq: Once | INTRAVENOUS | Status: AC
  Administered 2014-06-30: 600 mg via INTRAVENOUS
  Filled 2014-06-30: qty 50

## 2014-06-30 MED ORDER — HYDROCODONE-ACETAMINOPHEN 5-325 MG PO TABS: 1.0000 | ORAL_TABLET | Freq: Four times a day (QID) | ORAL | Status: DC | PRN

## 2014-06-30 MED ORDER — ONDANSETRON HCL 4 MG/2ML IJ SOLN
4.0000 mg | Freq: Once | INTRAMUSCULAR | Status: AC
  Administered 2014-06-30: 4 mg via INTRAVENOUS
  Filled 2014-06-30: qty 2

## 2014-06-30 MED ORDER — CIPROFLOXACIN HCL 500 MG PO TABS: 500.0000 mg | ORAL_TABLET | Freq: Two times a day (BID) | ORAL | Status: DC

## 2014-06-30 MED ORDER — ONDANSETRON HCL 4 MG PO TABS: 4.0000 mg | ORAL_TABLET | Freq: Four times a day (QID) | ORAL | Status: DC

## 2014-06-30 MED ORDER — DEXTROSE 5 % IV SOLN
1.0000 g | Freq: Once | INTRAVENOUS | Status: DC
  Filled 2014-06-30: qty 10

## 2014-06-30 NOTE — ED Provider Notes (Signed)
CSN: [id number]     Arrival date & time 06/30/14  2355 History   First MD Initiated Contact with Patient 06/30/14 [phone]     Chief Complaint  Patient presents with  . Flank Pain     (Consider location/radiation/quality/duration/timing/severity/associated sxs/prior Treatment) HPI Comments: Patient presents today with a chief complaint of right flank pain.  She reports that the pain has been intermittent over the past 2 days, but has been constant since earlier this morning.  She states that this morning the pain began radiating into the right lower abdomen.  She reports associated nausea, but denies vomiting.  Denies fever, chills, dysuria, hematuria, increased urinary frequency, or urgency.  She reports that she has taken Hydrocodone for her pain, but does not feel that it helps.  She denies prior history of Kidney Stones.  She states that she has never had pain like this before.  Past abdominal surgeries include Abdominal Hysterectomy and Cholecystectomy.  The history is provided by the patient.    Past Medical History  Diagnosis Date  . HYPERLIPIDEMIA 07/22/2007  . MITRAL VALVE PROLAPSE 07/22/2007  . SINUSITIS- ACUTE-NOS 09/09/2007  . GERD 07/22/2007  . IBS 07/23/2007  . Unspecified Vaginitis and Vulvovaginitis 12/17/2007  . DEGENERATIVE JOINT DISEASE, LUMBAR SPINE 07/23/2007  . BACK PAIN 07/23/2007  . TACHYCARDIA 08/20/2010  . CHEST PAIN 09/04/2010  . Gastric polyp     x3   Past Surgical History  Procedure Laterality Date  . Abdominal hysterectomy    . Tubal ligation    . Cholecystectomy    . Tonsillectomy    . Rle tib/fib fx/surgury     Family History  Problem Relation Age of Onset  . Colon polyps Mother   . Angina Mother   . Heart disease Mother     CVA  . Coronary artery disease Mother   . Stroke Mother   . Hypertension Other   . Diabetes Other   . Hyperlipidemia     History  Substance Use Topics  . Smoking status: Never Smoker   . Smokeless tobacco: Not on file   . Alcohol Use: No   OB History    No data available     Review of Systems  All other systems reviewed and are negative.     Allergies  Atorvastatin; Penicillins; Simvastatin; and Tetracycline  Home Medications   Prior to Admission medications   Medication Sig Start Date End Date Taking? Authorizing Provider  ALPRAZolam (XANAX) 0.5 MG tablet TAKE 1 TABLET BY MOUTH TWICE A DAY AS NEEDED Patient taking differently: Take 0.5 mg by mouth 2 (two) times daily as needed for anxiety.  04/27/14  Yes Biagio Borg, MD  aspirin 81 MG EC tablet Take 81 mg by mouth daily.     Yes Historical Provider, MD  DULoxetine (CYMBALTA) 30 MG capsule Take 30 mg by mouth daily.  06/27/14  Yes Historical Provider, MD  famotidine (PEPCID) 40 MG tablet Take 40 mg by mouth 2 (two) times daily.   Yes Historical Provider, MD  HYDROcodone-acetaminophen (NORCO) 7.5-325 MG per tablet TAKE 1 TABLET BY MOUTH 4 TIMES A DAY AS NEEDED FOR PAIN - to fill February 03, 2014 Patient taking differently: Take 1 tablet by mouth every 4 (four) hours as needed for moderate pain.  12/02/13  Yes Biagio Borg, MD  meloxicam (MOBIC) 15 MG tablet Take 1 tablet (15 mg total) by mouth daily. 09/21/13  Yes Chelle S Jeffery, PA-C  Multiple Vitamin (MULTIVITAMIN WITH MINERALS) TABS tablet  Take 1 tablet by mouth daily.   Yes Historical Provider, MD  pravastatin (PRAVACHOL) 40 MG tablet Take 40 mg by mouth daily.   Yes Historical Provider, MD  sucralfate (CARAFATE) 1 G tablet Take 1 g by mouth 2 (two) times daily.     Yes Historical Provider, MD  pravastatin (PRAVACHOL) 40 MG tablet TAKE 1 TABLET BY MOUTH DAILY 11/08/13   Biagio Borg, MD   BP 130/75 mmHg  Pulse 93  Temp(Src) 98 F (36.7 C) (Oral)  Resp 18  Ht 5\' 5"  (1.651 m)  Wt 220 lb (99.791 kg)  BMI 36.61 kg/m2  SpO2 98% Physical Exam  Constitutional: She appears well-developed and well-nourished.  HENT:  Head: Normocephalic and atraumatic.  Mouth/Throat: Oropharynx is clear and moist.   Neck: Normal range of motion. Neck supple.  Cardiovascular: Normal rate, regular rhythm and normal heart sounds.   Pulmonary/Chest: Effort normal and breath sounds normal.  Abdominal: Soft. Bowel sounds are normal. She exhibits no distension and no mass. There is tenderness in the right lower quadrant. There is CVA tenderness. There is no rebound, no guarding and no tenderness at McBurney's point.  Right CVA tenderness  Musculoskeletal: Normal range of motion.  Neurological: She is alert.  Skin: Skin is warm and dry.  Psychiatric: She has a normal mood and affect.  Nursing note and vitals reviewed.   ED Course  Procedures (including critical care time) Labs Review Labs Reviewed  URINALYSIS, ROUTINE W REFLEX MICROSCOPIC  CBC WITH DIFFERENTIAL  COMPREHENSIVE METABOLIC PANEL    Imaging Review No results found.   EKG Interpretation None      MDM   Final diagnoses:  None   Patient presents today with right flank pain that is radiating to the RLQ of the abdomen.  Labs unremarkable.  UA showing small leukocytes, many bacteria, and hemoglobin.  Patient given IV Cipro in the ED and discharged home with Cipro.  Urine sent for culture.  CT scan also ordered to rule out kidney stone.  No evidence of kidney stone, however, scan showed an enlarged right ovary and pelvic ultrasound was recommended.  Pelvic ultrasound showed cystic areas of the right ovary.  This was thought to be benign, however, follow up ultrasound is recommended in 1 year according to the Society of Radiologists in Ultrasound 2010 Consensus Conference Statement.  Patient informed of this and the need for follow up.  Patient stable for discharge.  Return precautions given.      Hyman Bible, PA-C 07/02/14 [id number]  Artis Delay, MD 07/07/14 [phone]

## 2014-06-30 NOTE — ED Notes (Signed)
Pt. reports right flank pain radiating to right lateral abdomen with nausea , denies emesis or diarrhea , no hematuria or dysuria . Denies fever or chills.

## 2014-06-30 NOTE — Discharge Instructions (Signed)
It is recommended that you have a follow up ultrasound of your ovary in one year to reevaluate the cysts.

## 2014-07-03 LAB — URINE CULTURE: Colony Count: >=100000

## 2014-07-04 ENCOUNTER — Telehealth (HOSPITAL_COMMUNITY): Payer: Self-pay

## 2014-07-04 NOTE — ED Notes (Signed)
Post ED Visit - Positive Culture Follow-up  Culture report reviewed by antimicrobial stewardship pharmacist: []  Wes [place], Pharm.D., BCPS [x]  Heide Guile, Pharm.D., BCPS []  Alycia Rossetti, Pharm.D., BCPS []  [place], Pharm.D., BCPS, AAHIVP []  Legrand Como, Pharm.D., BCPS, AAHIVP []  Elicia Lamp, Pharm.D.   Positive urine culture Treated with cipro, organism sensitive to the same and no further patient follow-up is required at this time.  Ileene Musa 07/04/2014, 11:45 AM

## 2014-08-05 ENCOUNTER — Other Ambulatory Visit: Payer: Self-pay | Admitting: Internal Medicine

## 2014-08-07 NOTE — Telephone Encounter (Signed)
Done hardcopy to robin  

## 2014-08-08 NOTE — Telephone Encounter (Signed)
Faxed hardcopy for [facility]praz[facility]am to [facility] [address]

## 2014-08-29 ENCOUNTER — Other Ambulatory Visit: Payer: Self-pay | Admitting: Physical Medicine and Rehabilitation

## 2014-08-29 DIAGNOSIS — M545 Low back pain, unspecified: Secondary | ICD-10-CM

## 2014-08-29 DIAGNOSIS — M79604 Pain in right leg: Secondary | ICD-10-CM

## 2014-09-10 ENCOUNTER — Ambulatory Visit
Admission: RE | Admit: 2014-09-10 | Discharge: 2014-09-10 | Disposition: A | Discharge status: Home / Self Care | Payer: BLUE CROSS/BLUE SHIELD | Source: Ambulatory Visit | Attending: Physical Medicine and Rehabilitation | Admitting: Physical Medicine and Rehabilitation

## 2014-09-10 DIAGNOSIS — M545 Low back pain, unspecified: Secondary | ICD-10-CM

## 2014-10-27 ENCOUNTER — Other Ambulatory Visit: Payer: Self-pay | Admitting: Obstetrics

## 2014-10-27 DIAGNOSIS — Z1231 Encounter for screening mammogram for malignant neoplasm of breast: Secondary | ICD-10-CM

## 2014-10-30 ENCOUNTER — Other Ambulatory Visit: Payer: Self-pay | Admitting: Internal Medicine

## 2014-10-31 NOTE — Telephone Encounter (Signed)
Done hardcopy to [place]  Pt due for [place] next month

## 2014-11-01 ENCOUNTER — Other Ambulatory Visit: Payer: Self-pay | Admitting: Internal Medicine

## 2014-11-01 ENCOUNTER — Ambulatory Visit
Admission: RE | Admit: 2014-11-01 | Discharge: 2014-11-01 | Disposition: A | Discharge status: Home / Self Care | Payer: BLUE CROSS/BLUE SHIELD | Source: Ambulatory Visit | Attending: Obstetrics | Admitting: Obstetrics

## 2014-11-01 DIAGNOSIS — Z1231 Encounter for screening mammogram for malignant neoplasm of breast: Secondary | ICD-10-CM

## 2014-11-01 NOTE — Telephone Encounter (Signed)
Rx done. 

## 2014-11-02 ENCOUNTER — Other Ambulatory Visit: Payer: Self-pay | Admitting: Internal Medicine

## 2014-12-20 ENCOUNTER — Other Ambulatory Visit (INDEPENDENT_AMBULATORY_CARE_PROVIDER_SITE_OTHER): Payer: BLUE CROSS/BLUE SHIELD

## 2014-12-20 DIAGNOSIS — Z0000 Encounter for general adult medical examination without abnormal findings: Secondary | ICD-10-CM | POA: Diagnosis not present

## 2014-12-20 LAB — BASIC METABOLIC PANEL
BUN: 11 mg/dL (ref 6–23)
CHLORIDE: 103 meq/L (ref 96–112)
CO2: 27 meq/L (ref 19–32)
Calcium: 9.1 mg/dL (ref 8.4–10.5)
Creatinine, Ser: 1.43 mg/dL — ABNORMAL HIGH (ref 0.40–1.20)
GFR: 41.36 mL/min — ABNORMAL LOW (ref >60.00)
Glucose, Bld: 84 mg/dL (ref 70–99)
Potassium: 3.7 mEq/L (ref 3.5–5.1)
SODIUM: 137 meq/L (ref 135–145)

## 2014-12-20 LAB — LIPID PANEL
CHOLESTEROL: 139 mg/dL (ref 0–200)
HDL: 40.5 mg/dL (ref >39.00)
LDL Cholesterol: 73 mg/dL (ref 0–99)
NonHDL: 98.5
Total CHOL/HDL Ratio: 3
Triglycerides: 127 mg/dL (ref 0.0–149.0)
VLDL: 25.4 mg/dL (ref 0.0–40.0)

## 2014-12-20 LAB — CBC WITH DIFFERENTIAL/PLATELET
BASOS ABS: 0.1 10*3/uL (ref 0.0–0.1)
Basophils Relative: 0.6 % (ref 0.0–3.0)
EOS ABS: 0.2 10*3/uL (ref 0.0–0.7)
Eosinophils Relative: 2.5 % (ref 0.0–5.0)
HEMATOCRIT: 45.4 % (ref 36.0–46.0)
Hemoglobin: 15.5 g/dL — ABNORMAL HIGH (ref 12.0–15.0)
LYMPHS ABS: 3.4 10*3/uL (ref 0.7–4.0)
Lymphocytes Relative: 38.5 % (ref 12.0–46.0)
MCHC: 34.2 g/dL (ref 30.0–36.0)
MCV: 87.9 fl (ref 78.0–100.0)
Monocytes Absolute: 0.7 10*3/uL (ref 0.1–1.0)
Monocytes Relative: 8.4 % (ref 3.0–12.0)
NEUTROS PCT: 50 % (ref 43.0–77.0)
Neutro Abs: 4.5 10*3/uL (ref 1.4–7.7)
Platelets: 309 10*3/uL (ref 150.0–400.0)
RBC: 5.16 Mil/uL — ABNORMAL HIGH (ref 3.87–5.11)
RDW: 12.5 % (ref 11.5–15.5)
WBC: 8.9 10*3/uL (ref 4.0–10.5)

## 2014-12-20 LAB — HEPATIC FUNCTION PANEL
ALK PHOS: 51 U/L (ref 39–117)
ALT: 21 U/L (ref 0–35)
AST: 20 U/L (ref 0–37)
Albumin: 4 g/dL (ref 3.5–5.2)
Bilirubin, Direct: 0.1 mg/dL (ref 0.0–0.3)
TOTAL PROTEIN: 6.8 g/dL (ref 6.0–8.3)
Total Bilirubin: 0.5 mg/dL (ref 0.2–1.2)

## 2014-12-20 LAB — URINALYSIS, ROUTINE W REFLEX MICROSCOPIC
Bilirubin Urine: NEGATIVE
Ketones, ur: NEGATIVE
Nitrite: POSITIVE — AB
Specific Gravity, Urine: 1.015 (ref 1.000–1.030)
TOTAL PROTEIN, URINE-UPE24: NEGATIVE
UROBILINOGEN UA: 1 (ref 0.0–1.0)
Urine Glucose: NEGATIVE
pH: 6 (ref 5.0–8.0)

## 2014-12-20 LAB — TSH: TSH: 2.38 u[IU]/mL (ref 0.35–4.50)

## 2014-12-27 ENCOUNTER — Ambulatory Visit (INDEPENDENT_AMBULATORY_CARE_PROVIDER_SITE_OTHER): Payer: BLUE CROSS/BLUE SHIELD | Admitting: Internal Medicine

## 2014-12-27 ENCOUNTER — Other Ambulatory Visit (INDEPENDENT_AMBULATORY_CARE_PROVIDER_SITE_OTHER): Payer: BLUE CROSS/BLUE SHIELD

## 2014-12-27 ENCOUNTER — Encounter: Payer: Self-pay | Admitting: Internal Medicine

## 2014-12-27 VITALS — BP 118/72 | HR 81 | Temp 99.1°F | Resp 18 | Ht 66.5 in | Wt 220.0 lb

## 2014-12-27 DIAGNOSIS — Z0000 Encounter for general adult medical examination without abnormal findings: Secondary | ICD-10-CM | POA: Diagnosis not present

## 2014-12-27 DIAGNOSIS — R8290 Unspecified abnormal findings in urine: Secondary | ICD-10-CM | POA: Diagnosis not present

## 2014-12-27 DIAGNOSIS — F419 Anxiety disorder, unspecified: Secondary | ICD-10-CM

## 2014-12-27 DIAGNOSIS — E785 Hyperlipidemia, unspecified: Secondary | ICD-10-CM

## 2014-12-27 DIAGNOSIS — N179 Acute kidney failure, unspecified: Secondary | ICD-10-CM | POA: Diagnosis not present

## 2014-12-27 LAB — BASIC METABOLIC PANEL
BUN: 10 mg/dL (ref 6–23)
CHLORIDE: 102 meq/L (ref 96–112)
CO2: 28 mEq/L (ref 19–32)
CREATININE: 0.93 mg/dL (ref 0.40–1.20)
Calcium: 9.7 mg/dL (ref 8.4–10.5)
GFR: 67.94 mL/min (ref >60.00)
GLUCOSE: 98 mg/dL (ref 70–99)
POTASSIUM: 4 meq/L (ref 3.5–5.1)
Sodium: 136 mEq/L (ref 135–145)

## 2014-12-27 MED ORDER — CEPHALEXIN 500 MG PO CAPS: 500.0000 mg | ORAL_CAPSULE | Freq: Four times a day (QID) | ORAL | Status: DC

## 2014-12-27 MED ORDER — PRAVASTATIN SODIUM 40 MG PO TABS: 40.0000 mg | ORAL_TABLET | Freq: Every day | ORAL | Status: DC

## 2014-12-27 MED ORDER — ALPRAZOLAM 1 MG PO TABS: 1.0000 mg | ORAL_TABLET | Freq: Two times a day (BID) | ORAL | Status: DC | PRN

## 2014-12-27 NOTE — Progress Notes (Signed)
Pre visit review using our clinic review tool, if applicable. No additional management support is needed unless otherwise documented below in the visit note. 

## 2014-12-27 NOTE — Assessment & Plan Note (Signed)
Has atypical symptoms and lower abd pain last pm, and midlly abnormal urine, but also with recent unexplained increase cr - all c/w higher suspicion for UTI - for cephalexain asd

## 2014-12-27 NOTE — Assessment & Plan Note (Signed)
?   Volume vs UTI vs other or even lab error - for repeat BMP today, consider renal u/s, consider nephrology referral

## 2014-12-27 NOTE — Patient Instructions (Signed)
Please take all new medication as prescribed - the antibiotic  Please continue all other medications as before, and refills have been done if requested, including the xanax.  Please have the pharmacy call with any other refills you may need.  Please continue your efforts at being more active, low cholesterol diet, and weight control.  You are otherwise up to date with prevention measures today.  Please keep your appointments with your specialists as you may have planned  Please go to the LAB in the Basement (turn left off the elevator) for the tests to be done today - just the kidney test repeat today  You will be contacted by phone if any changes need to be made immediately.  Otherwise, you will receive a letter about your results with an explanation, but please check with MyChart first.  Please remember to sign up for MyChart if you have not done so, as this will be important to you in the future with finding out test results, communicating by private email, and scheduling acute appointments online when needed.  Please return in 6 months, or sooner if needed

## 2014-12-27 NOTE — Assessment & Plan Note (Signed)
Mild uncontrolled, for xanax 1 mg  Bid prn

## 2014-12-27 NOTE — Progress Notes (Signed)
Subjective:    Patient ID: Pamela Gibson, female    DOB: Jul 15, 1966, 49 y.o.   MRN: [id number]  HPI  Here for wellness and f/u;  Overall doing ok;  Pt denies Chest pain, worsening SOB, DOE, wheezing, orthopnea, PND, worsening LE edema, palpitations, dizziness or syncope.  Pt denies neurological change such as new headache, facial or extremity weakness.  Pt denies polydipsia, polyuria, or low sugar symptoms. Pt states overall good compliance with treatment and medications, good tolerability, and has been trying to follow appropriate diet.  Pt denies worsening depressive symptoms, suicidal ideation or panic. No fever, night sweats, wt loss, loss of appetite, or other constitutional symptoms.  Pt states good ability with ADL's, has low fall risk, home safety reviewed and adequate, no other significant changes in hearing or vision, and only occasionally active with exercise.  Urine smells stronger lately, feels like maybe not keeping up with fluids as well lately. C/o uncontrolled anxiety despite current meds.  Seeing [facility] - has known > 4 cm right ovary cyst with watchful waiting, will need surgury if > 5 cm.  Denies urinary symptoms such as dysuria, frequency, urgency, flank pain, hematuria or n/v, fever, chills, but has had unusual "strong" urine in past week. Also just last night had 30 min of lower abd pain, mod to severe, not recurred since then.  Cont's to see pain management once monthly Past Medical History  Diagnosis Date  . HYPERLIPIDEMIA 07/22/2007  . MITRAL VALVE PROLAPSE 07/22/2007  . SINUSITIS- ACUTE-NOS 09/09/2007  . GERD 07/22/2007  . IBS 07/23/2007  . Unspecified Vaginitis and Vulvovaginitis 12/17/2007  . DEGENERATIVE JOINT DISEASE, LUMBAR SPINE 07/23/2007  . BACK PAIN 07/23/2007  . TACHYCARDIA 08/20/2010  . CHEST PAIN 09/04/2010  . Gastric polyp     x3   Past Surgical History  Procedure Laterality Date  . Abdominal hysterectomy    . Tubal ligation    . Cholecystectomy    .  Tonsillectomy    . Rle tib/fib fx/surgury      reports that she has never smoked. She does not have any smokeless tobacco history on file. She reports that she does not drink alcohol. Her drug history is not on file. family history includes Angina in her mother; Colon polyps in her mother; Coronary artery disease in her mother; Diabetes in her other; Heart disease in her mother; Hyperlipidemia in an other family member; Hypertension in her other; Stroke in her mother. Allergies  Allergen Reactions  . Atorvastatin     REACTION: more forgetful  . Penicillins     REACTION: turns blue  . Simvastatin     REACTION: more forgetful  . Tetracycline     REACTION: itch   Current Outpatient Prescriptions on File Prior to Visit  Medication Sig Dispense Refill  . aspirin 81 MG EC tablet Take 81 mg by mouth daily.      . famotidine (PEPCID) 40 MG tablet Take 40 mg by mouth 2 (two) times daily.    . sucralfate (CARAFATE) 1 G tablet Take 1 g by mouth 2 (two) times daily.       No current facility-administered medications on file prior to visit.    Review of Systems Constitutional: Negative for increased diaphoresis, other activity, appetite or siginficant weight change other than noted HENT: Negative for worsening hearing loss, ear pain, facial swelling, mouth sores and neck stiffness.   Eyes: Negative for other worsening pain, redness or visual disturbance.  Respiratory: Negative for shortness  of breath and wheezing  Cardiovascular: Negative for chest pain and palpitations.  Gastrointestinal: Negative for diarrhea, blood in stool, abdominal distention or other pain Genitourinary: Negative for hematuria, flank pain or change in urine volume.  Musculoskeletal: Negative for myalgias or other joint complaints.  Skin: Negative for color change and wound or drainage.  Neurological: Negative for syncope and numbness. other than noted Hematological: Negative for adenopathy. or other  swelling Psychiatric/Behavioral: Negative for hallucinations, SI, self-injury, decreased concentration or other worsening agitation.      Objective:   Physical Exam BP 118/72 mmHg  Pulse 81  Temp(Src) 99.1 F (37.3 C) (Oral)  Resp 18  Ht 5' 6.5" (1.689 m)  Wt 220 lb 0.6 oz (99.809 kg)  BMI 34.99 kg/m2  SpO2 96% VS noted,  Constitutional: Pt is oriented to person, place, and time. Appears well-developed and well-nourished, in no significant distress Head: Normocephalic and atraumatic.  Right Ear: External ear normal.  Left Ear: External ear normal.  Nose: Nose normal.  Mouth/Throat: Oropharynx is clear and moist.  Eyes: Conjunctivae and EOM are normal. Pupils are equal, round, and reactive to light.  Neck: Normal range of motion. Neck supple. No JVD present. No tracheal deviation present or significant neck LA or mass Cardiovascular: Normal rate, regular rhythm, normal heart sounds and intact distal pulses.   Pulmonary/Chest: Effort normal and breath sounds without rales or wheezing  Abdominal: Soft. Bowel sounds are normal. NT. No HSM  Musculoskeletal: Normal range of motion. Exhibits no edema.  Lymphadenopathy:  Has no cervical adenopathy.  Neurological: Pt is alert and oriented to person, place, and time. Pt has normal reflexes. No cranial nerve deficit. Motor grossly intact Skin: Skin is warm and dry. No rash noted.  Psychiatric:  Has nervous mood and affect. Behavior is normal.  Lumbar spine with chronic midline mild tender    Assessment & Plan:

## 2014-12-27 NOTE — Assessment & Plan Note (Signed)
stable overall by history and exam, recent data reviewed with pt, and pt to continue medical treatment as before,  to f/u any worsening symptoms or concerns Lab Results  Component Value Date   LDLCALC 73 12/20/2014

## 2014-12-27 NOTE — Assessment & Plan Note (Signed)

## 2015-01-01 ENCOUNTER — Telehealth: Payer: Self-pay | Admitting: Internal Medicine

## 2015-01-01 MED ORDER — FLUCONAZOLE 150 MG PO TABS: 150.0000 mg | ORAL_TABLET | Freq: Every day | ORAL | Status: DC

## 2015-01-01 NOTE — Telephone Encounter (Signed)
MD rx cephalaxin will send in diflucan to [facility]. Notified pt med has been swent...Pamela Gibson

## 2015-01-01 NOTE — Telephone Encounter (Signed)
Patient states she has a yeast infection from the antibiotic that was prescribed last week.  [facility] uses [facility] on [address]

## 2015-04-21 ENCOUNTER — Other Ambulatory Visit: Payer: Self-pay | Admitting: Internal Medicine

## 2015-04-23 ENCOUNTER — Other Ambulatory Visit: Payer: Self-pay | Admitting: Internal Medicine

## 2015-04-24 NOTE — Telephone Encounter (Signed)
Done hardcopy to Dahlia  

## 2015-04-24 NOTE — Telephone Encounter (Signed)
Rx faxed to pharmacy  

## 2015-07-03 ENCOUNTER — Encounter: Payer: Self-pay | Admitting: Internal Medicine

## 2015-07-03 ENCOUNTER — Other Ambulatory Visit (INDEPENDENT_AMBULATORY_CARE_PROVIDER_SITE_OTHER): Payer: BLUE CROSS/BLUE SHIELD

## 2015-07-03 ENCOUNTER — Ambulatory Visit (INDEPENDENT_AMBULATORY_CARE_PROVIDER_SITE_OTHER): Payer: BLUE CROSS/BLUE SHIELD | Admitting: Internal Medicine

## 2015-07-03 VITALS — BP 122/80 | HR 89 | Temp 98.2°F | Ht 67.0 in | Wt 232.0 lb

## 2015-07-03 DIAGNOSIS — Z0189 Encounter for other specified special examinations: Secondary | ICD-10-CM | POA: Diagnosis not present

## 2015-07-03 DIAGNOSIS — E785 Hyperlipidemia, unspecified: Secondary | ICD-10-CM

## 2015-07-03 DIAGNOSIS — F419 Anxiety disorder, unspecified: Secondary | ICD-10-CM

## 2015-07-03 DIAGNOSIS — N83201 Unspecified ovarian cyst, right side: Secondary | ICD-10-CM

## 2015-07-03 DIAGNOSIS — Z0000 Encounter for general adult medical examination without abnormal findings: Secondary | ICD-10-CM

## 2015-07-03 DIAGNOSIS — R8290 Unspecified abnormal findings in urine: Secondary | ICD-10-CM | POA: Diagnosis not present

## 2015-07-03 HISTORY — DX: Unspecified ovarian cyst, right side: N83.201

## 2015-07-03 LAB — URINALYSIS, ROUTINE W REFLEX MICROSCOPIC
Bilirubin Urine: NEGATIVE
KETONES UR: NEGATIVE
Leukocytes, UA: NEGATIVE
Nitrite: POSITIVE — AB
Total Protein, Urine: NEGATIVE
Urine Glucose: NEGATIVE
Urobilinogen, UA: 0.2 (ref 0.0–1.0)
WBC, UA: NONE SEEN (ref 0–?)
pH: 6 (ref 5.0–8.0)

## 2015-07-03 LAB — CBC WITH DIFFERENTIAL/PLATELET
BASOS PCT: 1.3 % (ref 0.0–3.0)
Basophils Absolute: 0.1 10*3/uL (ref 0.0–0.1)
EOS ABS: 0.2 10*3/uL (ref 0.0–0.7)
Eosinophils Relative: 2.3 % (ref 0.0–5.0)
HCT: 45.4 % (ref 36.0–46.0)
HEMOGLOBIN: 15.3 g/dL — AB (ref 12.0–15.0)
LYMPHS ABS: 3 10*3/uL (ref 0.7–4.0)
Lymphocytes Relative: 34.5 % (ref 12.0–46.0)
MCHC: 33.8 g/dL (ref 30.0–36.0)
MCV: 87.9 fl (ref 78.0–100.0)
MONO ABS: 0.7 10*3/uL (ref 0.1–1.0)
Monocytes Relative: 7.9 % (ref 3.0–12.0)
NEUTROS PCT: 54 % (ref 43.0–77.0)
Neutro Abs: 4.6 10*3/uL (ref 1.4–7.7)
Platelets: 303 10*3/uL (ref 150.0–400.0)
RBC: 5.16 Mil/uL — AB (ref 3.87–5.11)
RDW: 12.7 % (ref 11.5–15.5)
WBC: 8.6 10*3/uL (ref 4.0–10.5)

## 2015-07-03 LAB — LIPID PANEL
CHOLESTEROL: 150 mg/dL (ref 0–200)
HDL: 38.4 mg/dL — ABNORMAL LOW (ref >39.00)
LDL CALC: 85 mg/dL (ref 0–99)
NonHDL: 111.38
TRIGLYCERIDES: 132 mg/dL (ref 0.0–149.0)
Total CHOL/HDL Ratio: 4
VLDL: 26.4 mg/dL (ref 0.0–40.0)

## 2015-07-03 LAB — BASIC METABOLIC PANEL
BUN: 12 mg/dL (ref 6–23)
CO2: 27 meq/L (ref 19–32)
Calcium: 9.6 mg/dL (ref 8.4–10.5)
Chloride: 103 mEq/L (ref 96–112)
Creatinine, Ser: 0.95 mg/dL (ref 0.40–1.20)
GFR: 66.16 mL/min (ref >60.00)
GLUCOSE: 85 mg/dL (ref 70–99)
POTASSIUM: 3.9 meq/L (ref 3.5–5.1)
Sodium: 138 mEq/L (ref 135–145)

## 2015-07-03 LAB — HEPATIC FUNCTION PANEL
ALBUMIN: 4.2 g/dL (ref 3.5–5.2)
ALT: 20 U/L (ref 0–35)
AST: 19 U/L (ref 0–37)
Alkaline Phosphatase: 52 U/L (ref 39–117)
Bilirubin, Direct: 0.1 mg/dL (ref 0.0–0.3)
TOTAL PROTEIN: 7.1 g/dL (ref 6.0–8.3)
Total Bilirubin: 0.5 mg/dL (ref 0.2–1.2)

## 2015-07-03 LAB — TSH: TSH: 1.39 u[IU]/mL (ref 0.35–4.50)

## 2015-07-03 MED ORDER — ALPRAZOLAM 1 MG PO TABS: ORAL_TABLET | ORAL | Status: DC

## 2015-07-03 NOTE — Progress Notes (Signed)
Subjective:    Patient ID: Pamela Gibson, female    DOB: 11-25-1965, 49 y.o.   MRN: [id number]  HPI  Here to f/u; overall doing ok,  Pt denies chest pain, increasing sob or doe, wheezing, orthopnea, PND, increased LE swelling, palpitations, dizziness or syncope.  Pt denies new neurological symptoms such as new headache, or facial or extremity weakness or numbness.  Pt denies polydipsia, polyuria, or low sugar episode.   Pt denies new neurological symptoms such as new headache, or facial or extremity weakness or numbness.   Pt states overall good compliance with meds, mostly trying to follow appropriate diet, with wt overall stable,  but little exercise however  Denies worsening depressive symptoms, suicidal ideation, or panic; has ongoing anxiety, not increased recently, asks for refill.. Has known hx of painful right ovary cyst, with recurriing pain, aready on vicodin for lbp prn, may need surgury eventually if > 5 cm - last check 2015 at 4 cm.  Seen per GI at [facility] in [place], last colonoscopy apprx 3 yrs ago, had polyp at one time, neg at one year fu, next due at total 5 yrs.  Xanax seems to wear off mid day, taking twce per day now., but does not want klonopin change. Did have evidence April 2016 for renal insuff, ? Etiology as in retrospect pt was not taking any nsaid.  Does have "strong urine." today without  urinary symptoms such as dysuria, frequency, urgency, flank pain, hematuria or n/v, fever, chills. Past Medical History  Diagnosis Date  . HYPERLIPIDEMIA 07/22/2007  . MITRAL VALVE PROLAPSE 07/22/2007  . SINUSITIS- ACUTE-NOS 09/09/2007  . GERD 07/22/2007  . IBS 07/23/2007  . Unspecified Vaginitis and Vulvovaginitis 12/17/2007  . DEGENERATIVE JOINT DISEASE, LUMBAR SPINE 07/23/2007  . BACK PAIN 07/23/2007  . TACHYCARDIA 08/20/2010  . CHEST PAIN 09/04/2010  . Gastric polyp     x3  . Cyst of right ovary 07/03/2015   Past Surgical History  Procedure Laterality Date  . Abdominal  hysterectomy    . Tubal ligation    . Cholecystectomy    . Tonsillectomy    . Rle tib/fib fx/surgury      reports that she has never smoked. She does not have any smokeless tobacco history on file. She reports that she does not drink alcohol. Her drug history is not on file. family history includes Angina in her mother; Colon polyps in her mother; Coronary artery disease in her mother; Diabetes in her other; Heart disease in her mother; Hyperlipidemia in an other family member; Hypertension in her other; Stroke in her mother. Allergies  Allergen Reactions  . Atorvastatin     REACTION: more forgetful  . Penicillins     REACTION: turns blue  . Simvastatin     REACTION: more forgetful  . Tetracycline     REACTION: itch   Current Outpatient Prescriptions on File Prior to Visit  Medication Sig Dispense Refill  . ALPRAZolam (XANAX) 1 MG tablet TAKE 1 TABLET TWICE A DAY AS NEEDED FOR ANXIETY 60 tablet 2  . aspirin 81 MG EC tablet Take 81 mg by mouth daily.      . DULoxetine (CYMBALTA) 60 MG capsule Take 60 mg by mouth daily.    . famotidine (PEPCID) 40 MG tablet Take 40 mg by mouth 2 (two) times daily.    . pravastatin (PRAVACHOL) 40 MG tablet Take 1 tablet (40 mg total) by mouth daily. 90 tablet 3  . sucralfate (CARAFATE) 1  G tablet Take 1 g by mouth 2 (two) times daily.      . cephALEXin (KEFLEX) 500 MG capsule Take 1 capsule (500 mg total) by mouth 4 (four) times daily. (Patient not taking: Reported on 07/03/2015) 40 capsule 0  . fluconazole (DIFLUCAN) 150 MG tablet Take 1 tablet (150 mg total) by mouth daily. (Patient not taking: Reported on 07/03/2015) 2 tablet 0   No current facility-administered medications on file prior to visit.    Review of Systems  Constitutional: Negative for unusual diaphoresis or night sweats HENT: Negative for ringing in ear or discharge Eyes: Negative for double vision or worsening visual disturbance.  Respiratory: Negative for choking and stridor.     Gastrointestinal: Negative for vomiting or other signifcant bowel change Genitourinary: Negative for hematuria or change in urine volume.  Musculoskeletal: Negative for other MSK pain or swelling Skin: Negative for color change and worsening wound.  Neurological: Negative for tremors and numbness other than noted  Psychiatric/Behavioral: Negative for decreased concentration or agitation other than above       Objective:   Physical Exam BP 122/80 mmHg  Pulse 89  Temp(Src) 98.2 F (36.8 C) (Oral)  Ht 5\' 7"  (1.702 m)  Wt 232 lb (105.235 kg)  BMI 36.33 kg/m2  SpO2 96%  VS noted,  Constitutional: Pt appears in no significant distress HENT: Head: NCAT.  Right Ear: External ear normal.  Left Ear: External ear normal.  Eyes: . Pupils are equal, round, and reactive to light. Conjunctivae and EOM are normal Neck: Normal range of motion. Neck supple.  Cardiovascular: Normal rate and regular rhythm.   Pulmonary/Chest: Effort normal and breath sounds without rales or wheezing.  Abd:  Soft, ND, + BS NT except for mild rlq tender Neurological: Pt is alert. Not confused , motor grossly intact Skin: Skin is warm. No rash, no LE edema Psychiatric: Pt behavior is normal. No agitation.      Assessment & Plan:

## 2015-07-03 NOTE — Patient Instructions (Signed)
Please continue all other medications as before, and refills have been done if requested.  Please have the pharmacy call with any other refills you may need.  Please continue your efforts at being more active, low cholesterol diet, and weight control.  You are otherwise up to date with prevention measures today.  Please keep your appointments with your specialists as you may have planned  Please go to the LAB in the Basement (turn left off the elevator) for the tests to be done today - just the urine testing today  You will be contacted by phone if any changes need to be made immediately.  Otherwise, you will receive a letter about your results with an explanation, but please check with MyChart first.  Please return in 6 months, or sooner if needed, with Lab testing done 3-5 days before

## 2015-07-03 NOTE — Progress Notes (Signed)
Pre visit review using our clinic review tool, if applicable. No additional management support is needed unless otherwise documented below in the visit note. 

## 2015-07-03 NOTE — Assessment & Plan Note (Signed)
stable overall by history and exam, recent data reviewed with pt, and pt to continue medical treatment as before,  to f/u any worsening symptoms or concerns Lab Results  Component Value Date   LDLCALC 73 12/20/2014    

## 2015-07-03 NOTE — Assessment & Plan Note (Signed)
D/w pt, I would not increse the xanax to tid to avoid dependence and w/d if stopped, cont cymbalta, delcines counseling

## 2015-07-03 NOTE — Assessment & Plan Note (Signed)
Exam with mild rlq tender assoc with known ovary cyst, but cannot ru uti - for urine studies

## 2015-07-05 LAB — URINE CULTURE: Colony Count: >=100000

## 2015-07-26 ENCOUNTER — Telehealth: Payer: Self-pay | Admitting: Internal Medicine

## 2015-07-26 NOTE — Telephone Encounter (Signed)
Does she mean both the xanax and the norco?

## 2015-07-26 NOTE — Telephone Encounter (Signed)
Pt called back stating that she found Rxs

## 2015-07-26 NOTE — Telephone Encounter (Signed)
Pt states she lost the prescription that was given to her at her last visit on 11/8.

## 2015-12-05 DIAGNOSIS — M47816 Spondylosis without myelopathy or radiculopathy, lumbar region: Secondary | ICD-10-CM | POA: Diagnosis not present

## 2015-12-05 DIAGNOSIS — G894 Chronic pain syndrome: Secondary | ICD-10-CM | POA: Diagnosis not present

## 2015-12-05 DIAGNOSIS — M5136 Other intervertebral disc degeneration, lumbar region: Secondary | ICD-10-CM | POA: Diagnosis not present

## 2015-12-05 DIAGNOSIS — Z79891 Long term (current) use of opiate analgesic: Secondary | ICD-10-CM | POA: Diagnosis not present

## 2015-12-05 DIAGNOSIS — M5416 Radiculopathy, lumbar region: Secondary | ICD-10-CM | POA: Diagnosis not present

## 2015-12-28 ENCOUNTER — Telehealth: Payer: Self-pay

## 2015-12-28 ENCOUNTER — Other Ambulatory Visit: Payer: Self-pay | Admitting: Geriatric Medicine

## 2015-12-28 ENCOUNTER — Other Ambulatory Visit (INDEPENDENT_AMBULATORY_CARE_PROVIDER_SITE_OTHER): Payer: BLUE CROSS/BLUE SHIELD

## 2015-12-28 DIAGNOSIS — Z0000 Encounter for general adult medical examination without abnormal findings: Secondary | ICD-10-CM

## 2015-12-28 LAB — URINALYSIS, ROUTINE W REFLEX MICROSCOPIC
Bilirubin Urine: NEGATIVE
Ketones, ur: NEGATIVE
Leukocytes, UA: NEGATIVE
Nitrite: POSITIVE — AB
PH: 6 (ref 5.0–8.0)
Total Protein, Urine: NEGATIVE
Urine Glucose: NEGATIVE
Urobilinogen, UA: 0.2 (ref 0.0–1.0)

## 2015-12-28 LAB — CBC WITH DIFFERENTIAL/PLATELET
BASOS PCT: 0.7 % (ref 0.0–3.0)
Basophils Absolute: 0.1 10*3/uL (ref 0.0–0.1)
EOS PCT: 1.9 % (ref 0.0–5.0)
Eosinophils Absolute: 0.2 10*3/uL (ref 0.0–0.7)
HCT: 45.9 % (ref 36.0–46.0)
Hemoglobin: 15.6 g/dL — ABNORMAL HIGH (ref 12.0–15.0)
LYMPHS ABS: 3.8 10*3/uL (ref 0.7–4.0)
Lymphocytes Relative: 40.3 % (ref 12.0–46.0)
MCHC: 34.1 g/dL (ref 30.0–36.0)
MCV: 87.7 fl (ref 78.0–100.0)
MONO ABS: 0.8 10*3/uL (ref 0.1–1.0)
Monocytes Relative: 7.9 % (ref 3.0–12.0)
Neutro Abs: 4.6 10*3/uL (ref 1.4–7.7)
Neutrophils Relative %: 49.2 % (ref 43.0–77.0)
Platelets: 300 10*3/uL (ref 150.0–400.0)
RBC: 5.23 Mil/uL — AB (ref 3.87–5.11)
RDW: 13 % (ref 11.5–15.5)
WBC: 9.4 10*3/uL (ref 4.0–10.5)

## 2015-12-28 LAB — LIPID PANEL
CHOL/HDL RATIO: 4
Cholesterol: 155 mg/dL (ref 0–200)
HDL: 40.5 mg/dL (ref >39.00)
LDL CALC: 88 mg/dL (ref 0–99)
NONHDL: 114.61
Triglycerides: 133 mg/dL (ref 0.0–149.0)
VLDL: 26.6 mg/dL (ref 0.0–40.0)

## 2015-12-28 LAB — HEPATIC FUNCTION PANEL
ALK PHOS: 55 U/L (ref 39–117)
ALT: 23 U/L (ref 0–35)
AST: 21 U/L (ref 0–37)
Albumin: 4.2 g/dL (ref 3.5–5.2)
BILIRUBIN TOTAL: 0.7 mg/dL (ref 0.2–1.2)
Bilirubin, Direct: 0.2 mg/dL (ref 0.0–0.3)
Total Protein: 7.1 g/dL (ref 6.0–8.3)

## 2015-12-28 LAB — BASIC METABOLIC PANEL
BUN: 13 mg/dL (ref 6–23)
CALCIUM: 9.6 mg/dL (ref 8.4–10.5)
CO2: 27 meq/L (ref 19–32)
Chloride: 100 mEq/L (ref 96–112)
Creatinine, Ser: 0.97 mg/dL (ref 0.40–1.20)
GFR: 64.46 mL/min (ref >60.00)
GLUCOSE: 96 mg/dL (ref 70–99)
POTASSIUM: 3.9 meq/L (ref 3.5–5.1)
Sodium: 136 mEq/L (ref 135–145)

## 2015-12-28 LAB — TSH: TSH: 2.77 u[IU]/mL (ref 0.35–4.50)

## 2015-12-28 NOTE — Telephone Encounter (Signed)
Error

## 2016-01-01 ENCOUNTER — Encounter: Payer: Self-pay | Admitting: Internal Medicine

## 2016-01-01 ENCOUNTER — Ambulatory Visit (INDEPENDENT_AMBULATORY_CARE_PROVIDER_SITE_OTHER): Payer: BLUE CROSS/BLUE SHIELD | Admitting: Internal Medicine

## 2016-01-01 DIAGNOSIS — Z0001 Encounter for general adult medical examination with abnormal findings: Secondary | ICD-10-CM | POA: Diagnosis not present

## 2016-01-01 DIAGNOSIS — Z0000 Encounter for general adult medical examination without abnormal findings: Secondary | ICD-10-CM | POA: Diagnosis not present

## 2016-01-01 DIAGNOSIS — J309 Allergic rhinitis, unspecified: Secondary | ICD-10-CM

## 2016-01-01 DIAGNOSIS — R6889 Other general symptoms and signs: Secondary | ICD-10-CM

## 2016-01-01 DIAGNOSIS — F419 Anxiety disorder, unspecified: Secondary | ICD-10-CM

## 2016-01-01 DIAGNOSIS — K219 Gastro-esophageal reflux disease without esophagitis: Secondary | ICD-10-CM

## 2016-01-01 DIAGNOSIS — G8929 Other chronic pain: Secondary | ICD-10-CM

## 2016-01-01 DIAGNOSIS — M545 Low back pain: Secondary | ICD-10-CM

## 2016-01-01 DIAGNOSIS — E785 Hyperlipidemia, unspecified: Secondary | ICD-10-CM | POA: Diagnosis not present

## 2016-01-01 MED ORDER — ALPRAZOLAM 1 MG PO TABS: ORAL_TABLET | ORAL | Status: DC

## 2016-01-01 MED ORDER — DEXLANSOPRAZOLE 60 MG PO CPDR: 60.0000 mg | DELAYED_RELEASE_CAPSULE | Freq: Every day | ORAL | Status: DC

## 2016-01-01 MED ORDER — PRAVASTATIN SODIUM 40 MG PO TABS: 40.0000 mg | ORAL_TABLET | Freq: Every day | ORAL | Status: DC

## 2016-01-01 NOTE — Assessment & Plan Note (Addendum)
Overall doing well, age appropriate education and counseling updated, referrals for preventative services and immunizations addressed, dietary and smoking counseling addressed, most recent labs reviewed.  I have personally reviewed and have noted:  1) the patient's medical and social history 2) The pt's use of alcohol, tobacco, and illicit drugs 3) The patient's current medications and supplements 4) Functional ability including ADL's, fall risk, home safety risk, hearing and visual impairment 5) Diet and physical activities 6) Evidence for depression or mood disorder 7) The patient's height, weight, and BMI have been recorded in the chart  I have made referrals, and provided counseling and education based on review of the above  Lab Results  Component Value Date   WBC 9.4 12/28/2015   HGB 15.6* 12/28/2015   HCT 45.9 12/28/2015   PLT 300.0 12/28/2015   GLUCOSE 96 12/28/2015   CHOL 155 12/28/2015   TRIG 133.0 12/28/2015   HDL 40.50 12/28/2015   LDLDIRECT 128.8 07/18/2008   LDLCALC 88 12/28/2015   ALT 23 12/28/2015   AST 21 12/28/2015   NA 136 12/28/2015   K 3.9 12/28/2015   CL 100 12/28/2015   CREATININE 0.97 12/28/2015   BUN 13 12/28/2015   CO2 27 12/28/2015   TSH 2.77 12/28/2015

## 2016-01-01 NOTE — Assessment & Plan Note (Signed)
stable overall by history and exam now on dexilant per [facility] costing $300 but is only thing that helps,  and pt to continue medical treatment as before,  to f/u any worsening symptoms or concerns

## 2016-01-01 NOTE — Assessment & Plan Note (Addendum)
Mild to mod, persistent without panic, ok for occas xanax third dosing prn daily, should not have to do this most days, cont cymbalta per pain clinic, to f/u any worsening symptoms or concerns  In addition to the time spent performing CPE, I spent an additional 40 minutes face to face,in which greater than 50% of this time was spent in counseling and coordination of care for patient's acute illness as documented.

## 2016-01-01 NOTE — Assessment & Plan Note (Signed)
stable overall by history and exam, recent data reviewed with pt, and pt to continue medical treatment as before,  to f/u any worsening symptoms or concerns Lab Results  Component Value Date   LDLCALC 88 12/28/2015

## 2016-01-01 NOTE — Progress Notes (Signed)
Pre visit review using our clinic review tool, if applicable. No additional management support is needed unless otherwise documented below in the visit note. 

## 2016-01-01 NOTE — Patient Instructions (Addendum)
Your EKG was OK today  OK to take the xanax as we discussed  Please continue all other medications as before, and refills have been done if requested.  Please have the pharmacy call with any other refills you may need.  Please continue your efforts at being more active, low cholesterol diet, and weight control.  You are otherwise up to date with prevention measures today.  Please keep your appointments with your specialists as you may have planned  Please return in 6 months, or sooner if needed

## 2016-01-01 NOTE — Assessment & Plan Note (Signed)
stable overall by history and exam, and pt to continue medical treatment as before,  to f/u any worsening symptoms or concerns, f/u pain clinic later this week

## 2016-01-01 NOTE — Assessment & Plan Note (Signed)
stable overall by history and exam, and pt to continue medical treatment as before,  to f/u any worsening symptoms or concerns 

## 2016-01-01 NOTE — Progress Notes (Signed)
Subjective:    Patient ID: Pamela Gibson, female    DOB: 07-Jul-1966, 50 y.o.   MRN: [id number]  HPI Here for wellness and f/u;  Overall doing ok;  Pt denies Chest pain, worsening SOB, DOE, wheezing, orthopnea, PND, worsening LE edema, palpitations, dizziness or syncope.  Pt denies neurological change such as new headache, facial or extremity weakness.  Pt denies polydipsia, polyuria, or low sugar symptoms. Pt states overall good compliance with treatment and medications, good tolerability, and has been trying to follow appropriate diet.  Pt denies worsening depressive symptoms, suicidal ideation or panic, though xanax as is does not cover her well during the day, but does not want change to klonopin or other at this time.. No fever, night sweats, wt loss, loss of appetite, or other constitutional symptoms.  Pt states good ability with ADL's, has low fall risk, home safety reviewed and adequate, no other significant changes in hearing or vision, and only occasionally active with exercise.  Sees GYN every nov for routine care.  Wt Readings from Last 3 Encounters:  01/01/16 230 lb (104.327 kg)  07/03/15 232 lb (105.235 kg)  12/27/14 220 lb 0.6 oz (99.809 kg)  Seeing [facility] later this wk. Does c/o ongoing fatigue, nad has developed signficant daytime hypersomnolence most days, with the wt gain.  Cymbalta working ok for depression. Pt continues to have recurring LBP without change in severity, bowel or bladder change, fever, wt loss,  worsening LE pain/numbness/weakness, gait change or falls. Past Medical History  Diagnosis Date  . HYPERLIPIDEMIA 07/22/2007  . MITRAL VALVE PROLAPSE 07/22/2007  . SINUSITIS- ACUTE-NOS 09/09/2007  . GERD 07/22/2007  . IBS 07/23/2007  . Unspecified Vaginitis and Vulvovaginitis 12/17/2007  . DEGENERATIVE JOINT DISEASE, LUMBAR SPINE 07/23/2007  . BACK PAIN 07/23/2007  . TACHYCARDIA 08/20/2010  . CHEST PAIN 09/04/2010  . Gastric polyp     x3  . Cyst of  right ovary 07/03/2015   Past Surgical History  Procedure Laterality Date  . Abdominal hysterectomy    . Tubal ligation    . Cholecystectomy    . Tonsillectomy    . Rle tib/fib fx/surgury      reports that she has never smoked. She does not have any smokeless tobacco history on file. She reports that she does not drink alcohol. Her drug history is not on file. family history includes Angina in her mother; Colon polyps in her mother; Coronary artery disease in her mother; Diabetes in her other; Heart disease in her mother; Hypertension in her other; Stroke in her mother. Allergies  Allergen Reactions  . Atorvastatin     REACTION: more forgetful  . Penicillins     REACTION: turns blue  . Simvastatin     REACTION: more forgetful  . Tetracycline     REACTION: itch   Current Outpatient Prescriptions on File Prior to Visit  Medication Sig Dispense Refill  . aspirin 81 MG EC tablet Take 81 mg by mouth daily.      . cyclobenzaprine (FLEXERIL) 5 MG tablet   1  . DULoxetine (CYMBALTA) 60 MG capsule Take 60 mg by mouth daily.    Marland Kitchen HYDROcodone-acetaminophen (NORCO) 10-325 MG tablet   0  . sucralfate (CARAFATE) 1 G tablet Take 1 g by mouth 2 (two) times daily.       No current facility-administered medications on file prior to visit.    Review of Systems Constitutional: Negative for increased diaphoresis, or other activity, appetite or siginficant weight  change other than noted HENT: Negative for worsening hearing loss, ear pain, facial swelling, mouth sores and neck stiffness.   Eyes: Negative for other worsening pain, redness or visual disturbance.  Respiratory: Negative for choking or stridor Cardiovascular: Negative for other chest pain and palpitations.  Gastrointestinal: Negative for worsening diarrhea, blood in stool, or abdominal distention Genitourinary: Negative for hematuria, flank pain or change in urine volume.  Musculoskeletal: Negative for myalgias or other joint  complaints.  Skin: Negative for other color change and wound or drainage.  Neurological: Negative for syncope and numbness. other than noted Hematological: Negative for adenopathy. or other swelling Psychiatric/Behavioral: Negative for hallucinations, SI, self-injury, decreased concentration or other worsening agitation.      Objective:   Physical Exam BP 124/80 mmHg  Pulse 96  Temp(Src) 98.5 F (36.9 C) (Oral)  Resp 20  Wt 230 lb (104.327 kg)  SpO2 95% VS noted,  Constitutional: Pt is oriented to person, place, and time. Appears well-developed and well-nourished, in no significant distress Head: Normocephalic and atraumatic  Eyes: Conjunctivae and EOM are normal. Pupils are equal, round, and reactive to light Right Ear: External ear normal.  Left Ear: External ear normal Nose: Nose normal.  Mouth/Throat: Oropharynx is clear and moist  Neck: Normal range of motion. Neck supple. No JVD present. No tracheal deviation present or significant neck LA or mass Cardiovascular: Normal rate, regular rhythm, normal heart sounds and intact distal pulses.   Pulmonary/Chest: Effort normal and breath sounds without rales or wheezing  Abdominal: Soft. Bowel sounds are normal. NT. No HSM  Musculoskeletal: Normal range of motion. Exhibits no edema Lymphadenopathy: Has no cervical adenopathy.  Neurological: Pt is alert and oriented to person, place, and time. Pt has normal reflexes. No cranial nerve deficit. Motor grossly intact Skin: Skin is warm and dry. No rash noted or new ulcers Psychiatric:  Has mild nervous mood and affect. Behavior is normal.    Assessment & Plan:

## 2016-01-03 DIAGNOSIS — M47816 Spondylosis without myelopathy or radiculopathy, lumbar region: Secondary | ICD-10-CM | POA: Diagnosis not present

## 2016-01-03 DIAGNOSIS — G894 Chronic pain syndrome: Secondary | ICD-10-CM | POA: Diagnosis not present

## 2016-01-03 DIAGNOSIS — M5416 Radiculopathy, lumbar region: Secondary | ICD-10-CM | POA: Diagnosis not present

## 2016-01-03 DIAGNOSIS — M5136 Other intervertebral disc degeneration, lumbar region: Secondary | ICD-10-CM | POA: Diagnosis not present

## 2016-01-17 ENCOUNTER — Other Ambulatory Visit: Payer: Self-pay | Admitting: Internal Medicine

## 2016-01-31 DIAGNOSIS — M5136 Other intervertebral disc degeneration, lumbar region: Secondary | ICD-10-CM | POA: Diagnosis not present

## 2016-01-31 DIAGNOSIS — G894 Chronic pain syndrome: Secondary | ICD-10-CM | POA: Diagnosis not present

## 2016-01-31 DIAGNOSIS — M5416 Radiculopathy, lumbar region: Secondary | ICD-10-CM | POA: Diagnosis not present

## 2016-01-31 DIAGNOSIS — M47816 Spondylosis without myelopathy or radiculopathy, lumbar region: Secondary | ICD-10-CM | POA: Diagnosis not present

## 2016-02-29 ENCOUNTER — Other Ambulatory Visit (HOSPITAL_COMMUNITY)
Admission: RE | Admit: 2016-02-29 | Discharge: 2016-02-29 | Disposition: A | Discharge status: Home / Self Care | Payer: BLUE CROSS/BLUE SHIELD | Source: Ambulatory Visit | Attending: Physical Medicine and Rehabilitation | Admitting: Physical Medicine and Rehabilitation

## 2016-02-29 DIAGNOSIS — M47816 Spondylosis without myelopathy or radiculopathy, lumbar region: Secondary | ICD-10-CM | POA: Diagnosis not present

## 2016-02-29 DIAGNOSIS — M2550 Pain in unspecified joint: Secondary | ICD-10-CM | POA: Diagnosis not present

## 2016-02-29 DIAGNOSIS — M5136 Other intervertebral disc degeneration, lumbar region: Secondary | ICD-10-CM | POA: Diagnosis not present

## 2016-02-29 DIAGNOSIS — M5416 Radiculopathy, lumbar region: Secondary | ICD-10-CM | POA: Diagnosis not present

## 2016-02-29 DIAGNOSIS — G894 Chronic pain syndrome: Secondary | ICD-10-CM | POA: Diagnosis not present

## 2016-02-29 LAB — SEDIMENTATION RATE: Sed Rate: 2 mm/hr (ref 0–22)

## 2016-02-29 LAB — C-REACTIVE PROTEIN: CRP: 0.6 mg/dL (ref <1.0)

## 2016-03-01 LAB — RHEUMATOID FACTOR

## 2016-03-03 LAB — ANTINUCLEAR ANTIBODIES, IFA: ANA Ab, IFA: NEGATIVE

## 2016-04-11 DIAGNOSIS — M5136 Other intervertebral disc degeneration, lumbar region: Secondary | ICD-10-CM | POA: Diagnosis not present

## 2016-04-11 DIAGNOSIS — G894 Chronic pain syndrome: Secondary | ICD-10-CM | POA: Diagnosis not present

## 2016-04-11 DIAGNOSIS — M47816 Spondylosis without myelopathy or radiculopathy, lumbar region: Secondary | ICD-10-CM | POA: Diagnosis not present

## 2016-04-11 DIAGNOSIS — M5416 Radiculopathy, lumbar region: Secondary | ICD-10-CM | POA: Diagnosis not present

## 2016-05-09 DIAGNOSIS — M5416 Radiculopathy, lumbar region: Secondary | ICD-10-CM | POA: Diagnosis not present

## 2016-05-09 DIAGNOSIS — M5136 Other intervertebral disc degeneration, lumbar region: Secondary | ICD-10-CM | POA: Diagnosis not present

## 2016-05-09 DIAGNOSIS — G894 Chronic pain syndrome: Secondary | ICD-10-CM | POA: Diagnosis not present

## 2016-05-09 DIAGNOSIS — M47816 Spondylosis without myelopathy or radiculopathy, lumbar region: Secondary | ICD-10-CM | POA: Diagnosis not present

## 2016-05-21 ENCOUNTER — Encounter: Payer: Self-pay | Admitting: *Deleted

## 2016-05-28 DIAGNOSIS — Z23 Encounter for immunization: Secondary | ICD-10-CM | POA: Diagnosis not present

## 2016-06-06 DIAGNOSIS — M5416 Radiculopathy, lumbar region: Secondary | ICD-10-CM | POA: Diagnosis not present

## 2016-06-06 DIAGNOSIS — M5136 Other intervertebral disc degeneration, lumbar region: Secondary | ICD-10-CM | POA: Diagnosis not present

## 2016-06-06 DIAGNOSIS — G894 Chronic pain syndrome: Secondary | ICD-10-CM | POA: Diagnosis not present

## 2016-06-06 DIAGNOSIS — M47816 Spondylosis without myelopathy or radiculopathy, lumbar region: Secondary | ICD-10-CM | POA: Diagnosis not present

## 2016-07-03 ENCOUNTER — Ambulatory Visit (INDEPENDENT_AMBULATORY_CARE_PROVIDER_SITE_OTHER): Payer: BLUE CROSS/BLUE SHIELD | Admitting: Internal Medicine

## 2016-07-03 ENCOUNTER — Telehealth: Payer: Self-pay

## 2016-07-03 ENCOUNTER — Encounter: Payer: Self-pay | Admitting: Internal Medicine

## 2016-07-03 VITALS — BP 130/70 | HR 92 | Temp 98.1°F | Resp 20 | Wt 241.0 lb

## 2016-07-03 DIAGNOSIS — E785 Hyperlipidemia, unspecified: Secondary | ICD-10-CM | POA: Diagnosis not present

## 2016-07-03 DIAGNOSIS — M19049 Primary osteoarthritis, unspecified hand: Secondary | ICD-10-CM | POA: Insufficient documentation

## 2016-07-03 DIAGNOSIS — M19042 Primary osteoarthritis, left hand: Secondary | ICD-10-CM

## 2016-07-03 DIAGNOSIS — M19041 Primary osteoarthritis, right hand: Secondary | ICD-10-CM | POA: Diagnosis not present

## 2016-07-03 DIAGNOSIS — Z0001 Encounter for general adult medical examination with abnormal findings: Secondary | ICD-10-CM

## 2016-07-03 DIAGNOSIS — F419 Anxiety disorder, unspecified: Secondary | ICD-10-CM

## 2016-07-03 DIAGNOSIS — G8929 Other chronic pain: Secondary | ICD-10-CM

## 2016-07-03 DIAGNOSIS — M545 Low back pain: Secondary | ICD-10-CM | POA: Diagnosis not present

## 2016-07-03 MED ORDER — DICLOFENAC SODIUM 1 % TD GEL: 4.0000 g | Freq: Four times a day (QID) | TRANSDERMAL | 11 refills | Status: DC | PRN

## 2016-07-03 NOTE — Progress Notes (Signed)
Pre visit review using our clinic review tool, if applicable. No additional management support is needed unless otherwise documented below in the visit note. 

## 2016-07-03 NOTE — Patient Instructions (Addendum)
Please take all new medication as prescribed - the anti-inflammatory gel to use for the hands as needed  Please continue all other medications as before, and refills have been done if requested.  Please have the pharmacy call with any other refills you may need.  Please continue your efforts at being more active, low cholesterol diet, and weight control.  Please keep your appointments with your specialists as you may have planned  Please return in 6 months, or sooner if needed, with Lab testing done 3-5 days before

## 2016-07-03 NOTE — Assessment & Plan Note (Signed)
With increased narcotic use/vicodin to q 4 hrs prn, cont's to follow with pain management , o/w stable

## 2016-07-03 NOTE — Telephone Encounter (Signed)
PA initiated and APPROVED from 07/03/2016 through 08/24/2038 via CoverMyMeds key [id number]

## 2016-07-03 NOTE — Progress Notes (Signed)
Subjective:    Patient ID: Pamela Gibson, female    DOB: 21-Sep-1965, 50 y.o.   MRN: [id number]  HPI  Here to f/u; overall doing ok,  Pt denies chest pain, increasing sob or doe, wheezing, orthopnea, PND, increased LE swelling, palpitations, dizziness or syncope.  Pt denies new neurological symptoms such as new headache, or facial or extremity weakness or numbness.  Pt denies polydipsia, polyuria, or low sugar episode.   Pt denies new neurological symptoms such as new headache, or facial or extremity weakness or numbness.   Pt states overall good compliance with meds, mostly trying to follow appropriate diet,  Wt Readings from Last 3 Encounters:  07/03/16 241 lb (109.3 kg)  01/01/16 230 lb (104.3 kg)  07/03/15 232 lb (105.2 kg)  wt increased recentlly with less exercise, Pt continues to have recurring LBP without change in severity, bowel or bladder change, fever, wt loss,  worsening LE pain/numbness/weakness, gait change or falls. Also has bilat hand pain and stiffnes every day for several months without swelling, worse during the day, but not really better at night. Denies worsening depressive symptoms, suicidal ideation, or panic; Past Medical History:  Diagnosis Date  . BACK PAIN 07/23/2007  . CHEST PAIN 09/04/2010  . Cyst of right ovary 07/03/2015  . DEGENERATIVE JOINT DISEASE, LUMBAR SPINE 07/23/2007  . Gastric polyp    x3  . GERD 07/22/2007  . HYPERLIPIDEMIA 07/22/2007  . IBS 07/23/2007  . MITRAL VALVE PROLAPSE 07/22/2007  . SINUSITIS- ACUTE-NOS 09/09/2007  . TACHYCARDIA 08/20/2010  . Unspecified Vaginitis and Vulvovaginitis 12/17/2007   Past Surgical History:  Procedure Laterality Date  . ABDOMINAL HYSTERECTOMY    . CHOLECYSTECTOMY    . RLE tib/fib fx/surgury    . TONSILLECTOMY    . TUBAL LIGATION      reports that she has never smoked. She does not have any smokeless tobacco history on file. She reports that she does not drink alcohol. Her drug history is not on  file. family history includes Angina in her mother; Colon polyps in her mother; Coronary artery disease in her mother; Diabetes in her other; Heart disease in her mother; Hypertension in her other; Stroke in her mother. Allergies  Allergen Reactions  . Atorvastatin     REACTION: more forgetful  . Penicillins     REACTION: turns blue  . Simvastatin     REACTION: more forgetful  . Tetracycline     REACTION: itch   Current Outpatient Prescriptions on File Prior to Visit  Medication Sig Dispense Refill  . ALPRAZolam (XANAX) 1 MG tablet TAKE 1 TABLET Three times A DAY AS NEEDED FOR ANXIETY 90 tablet 5  . aspirin 81 MG EC tablet Take 81 mg by mouth daily.      . cyclobenzaprine (FLEXERIL) 5 MG tablet   1  . dexlansoprazole (DEXILANT) 60 MG capsule Take 1 capsule (60 mg total) by mouth daily. 90 capsule 3  . DULoxetine (CYMBALTA) 60 MG capsule Take 60 mg by mouth daily.    Marland Kitchen HYDROcodone-acetaminophen (NORCO) 10-325 MG tablet   0  . pravastatin (PRAVACHOL) 40 MG tablet Take 1 tablet (40 mg total) by mouth daily. 90 tablet 3  . pravastatin (PRAVACHOL) 40 MG tablet TAKE 1 TABLET BY MOUTH DAILY 90 tablet 2   No current facility-administered medications on file prior to visit.    Review of Systems  Constitutional: Negative for unusual diaphoresis or night sweats HENT: Negative for ear swelling or discharge Eyes: Negative for  worsening visual haziness  Respiratory: Negative for choking and stridor.   Gastrointestinal: Negative for distension or worsening eructation Genitourinary: Negative for retention or change in urine volume.  Musculoskeletal: Negative for other MSK pain or swelling Skin: Negative for color change and worsening wound Neurological: Negative for tremors and numbness other than noted  Psychiatric/Behavioral: Negative for decreased concentration or agitation other than above   All other system neg per pt    Objective:   Physical Exam BP 130/70   Pulse 92   Temp 98.1 F  (36.7 C) (Oral)   Resp 20   Wt 241 lb (109.3 kg)   SpO2 96%   BMI 37.75 kg/m  VS noted,  Constitutional: Pt appears in no apparent distress HENT: Head: NCAT.  Right Ear: External ear normal.  Left Ear: External ear normal.  Eyes: . Pupils are equal, round, and reactive to light. Conjunctivae and EOM are normal Neck: Normal range of motion. Neck supple.  Cardiovascular: Normal rate and regular rhythm.   Pulmonary/Chest: Effort normal and breath sounds without rales or wheezing.  Abd:  Soft, NT, ND, + BS Neurological: Pt is alert. Not confused , motor grossly intact Skin: Skin is warm. No rash, no LE edema Psychiatric: Pt behavior is normal. No agitation. mild nervous Bilat hands with muliple nontender PIP and DIP bony arthritic changes No other significant exam findings     Assessment & Plan:

## 2016-07-04 DIAGNOSIS — M5136 Other intervertebral disc degeneration, lumbar region: Secondary | ICD-10-CM | POA: Diagnosis not present

## 2016-07-04 DIAGNOSIS — M5416 Radiculopathy, lumbar region: Secondary | ICD-10-CM | POA: Diagnosis not present

## 2016-07-04 DIAGNOSIS — G894 Chronic pain syndrome: Secondary | ICD-10-CM | POA: Diagnosis not present

## 2016-07-04 DIAGNOSIS — M47816 Spondylosis without myelopathy or radiculopathy, lumbar region: Secondary | ICD-10-CM | POA: Diagnosis not present

## 2016-07-06 NOTE — Assessment & Plan Note (Signed)
stable overall by history and exam, and pt to continue medical treatment as before,  to f/u any worsening symptoms or concerns 

## 2016-07-06 NOTE — Assessment & Plan Note (Signed)
stable overall by history and exam, recent data reviewed with pt, and pt to continue medical treatment as before,  to f/u any worsening symptoms or concerns Lab Results  Component Value Date   LDLCALC 88 12/28/2015

## 2016-07-06 NOTE — Assessment & Plan Note (Signed)
Mild to mod, for volt gel prn,  to f/u any worsening symptoms or concerns 

## 2016-07-11 DIAGNOSIS — Q398 Other congenital malformations of esophagus: Secondary | ICD-10-CM | POA: Diagnosis not present

## 2016-07-11 DIAGNOSIS — K219 Gastro-esophageal reflux disease without esophagitis: Secondary | ICD-10-CM | POA: Diagnosis not present

## 2016-07-24 DIAGNOSIS — Z01419 Encounter for gynecological examination (general) (routine) without abnormal findings: Secondary | ICD-10-CM | POA: Diagnosis not present

## 2016-07-24 DIAGNOSIS — N83201 Unspecified ovarian cyst, right side: Secondary | ICD-10-CM | POA: Diagnosis not present

## 2016-07-31 ENCOUNTER — Other Ambulatory Visit: Payer: Self-pay | Admitting: Internal Medicine

## 2016-07-31 DIAGNOSIS — Z01818 Encounter for other preprocedural examination: Secondary | ICD-10-CM | POA: Diagnosis not present

## 2016-07-31 DIAGNOSIS — A048 Other specified bacterial intestinal infections: Secondary | ICD-10-CM | POA: Diagnosis not present

## 2016-07-31 DIAGNOSIS — Q398 Other congenital malformations of esophagus: Secondary | ICD-10-CM | POA: Diagnosis not present

## 2016-07-31 NOTE — Telephone Encounter (Signed)
faxed

## 2016-07-31 NOTE — Telephone Encounter (Signed)
Done hardcopy to Corinne  

## 2016-08-01 DIAGNOSIS — M5136 Other intervertebral disc degeneration, lumbar region: Secondary | ICD-10-CM | POA: Diagnosis not present

## 2016-08-01 DIAGNOSIS — M5416 Radiculopathy, lumbar region: Secondary | ICD-10-CM | POA: Diagnosis not present

## 2016-08-01 DIAGNOSIS — G894 Chronic pain syndrome: Secondary | ICD-10-CM | POA: Diagnosis not present

## 2016-08-01 DIAGNOSIS — M47816 Spondylosis without myelopathy or radiculopathy, lumbar region: Secondary | ICD-10-CM | POA: Diagnosis not present

## 2016-08-04 ENCOUNTER — Other Ambulatory Visit: Payer: Self-pay | Admitting: Obstetrics & Gynecology

## 2016-08-04 DIAGNOSIS — Z1231 Encounter for screening mammogram for malignant neoplasm of breast: Secondary | ICD-10-CM

## 2016-08-05 DIAGNOSIS — K5281 Eosinophilic gastritis or gastroenteritis: Secondary | ICD-10-CM | POA: Diagnosis not present

## 2016-08-05 DIAGNOSIS — K2270 Barrett's esophagus without dysplasia: Secondary | ICD-10-CM | POA: Diagnosis not present

## 2016-08-05 DIAGNOSIS — K2970 Gastritis, unspecified, without bleeding: Secondary | ICD-10-CM | POA: Diagnosis not present

## 2016-08-05 DIAGNOSIS — A048 Other specified bacterial intestinal infections: Secondary | ICD-10-CM | POA: Diagnosis not present

## 2016-08-06 DIAGNOSIS — Q398 Other congenital malformations of esophagus: Secondary | ICD-10-CM | POA: Diagnosis not present

## 2016-08-27 DIAGNOSIS — K2270 Barrett's esophagus without dysplasia: Secondary | ICD-10-CM | POA: Diagnosis not present

## 2016-08-27 DIAGNOSIS — K2950 Unspecified chronic gastritis without bleeding: Secondary | ICD-10-CM | POA: Diagnosis not present

## 2016-08-27 DIAGNOSIS — Z1211 Encounter for screening for malignant neoplasm of colon: Secondary | ICD-10-CM | POA: Diagnosis not present

## 2016-08-29 ENCOUNTER — Ambulatory Visit: Payer: BLUE CROSS/BLUE SHIELD

## 2016-09-01 DIAGNOSIS — M5136 Other intervertebral disc degeneration, lumbar region: Secondary | ICD-10-CM | POA: Diagnosis not present

## 2016-09-01 DIAGNOSIS — G894 Chronic pain syndrome: Secondary | ICD-10-CM | POA: Diagnosis not present

## 2016-09-01 DIAGNOSIS — M5416 Radiculopathy, lumbar region: Secondary | ICD-10-CM | POA: Diagnosis not present

## 2016-09-01 DIAGNOSIS — M47816 Spondylosis without myelopathy or radiculopathy, lumbar region: Secondary | ICD-10-CM | POA: Diagnosis not present

## 2016-09-06 ENCOUNTER — Ambulatory Visit (INDEPENDENT_AMBULATORY_CARE_PROVIDER_SITE_OTHER): Payer: BLUE CROSS/BLUE SHIELD | Admitting: Family Medicine

## 2016-09-06 VITALS — BP 122/72 | HR 87 | Temp 97.6°F | Resp 17 | Ht 66.5 in | Wt 235.0 lb

## 2016-09-06 DIAGNOSIS — N3000 Acute cystitis without hematuria: Secondary | ICD-10-CM

## 2016-09-06 DIAGNOSIS — E6609 Other obesity due to excess calories: Secondary | ICD-10-CM

## 2016-09-06 DIAGNOSIS — L304 Erythema intertrigo: Secondary | ICD-10-CM

## 2016-09-06 DIAGNOSIS — K2270 Barrett's esophagus without dysplasia: Secondary | ICD-10-CM | POA: Diagnosis not present

## 2016-09-06 DIAGNOSIS — K319 Disease of stomach and duodenum, unspecified: Secondary | ICD-10-CM | POA: Insufficient documentation

## 2016-09-06 DIAGNOSIS — Z6837 Body mass index (BMI) 37.0-37.9, adult: Secondary | ICD-10-CM

## 2016-09-06 DIAGNOSIS — R8290 Unspecified abnormal findings in urine: Secondary | ICD-10-CM

## 2016-09-06 DIAGNOSIS — IMO0001 Reserved for inherently not codable concepts without codable children: Secondary | ICD-10-CM

## 2016-09-06 LAB — POCT URINALYSIS DIP (MANUAL ENTRY)
BILIRUBIN UA: NEGATIVE
Glucose, UA: NEGATIVE
Ketones, POC UA: NEGATIVE
LEUKOCYTES UA: NEGATIVE
NITRITE UA: POSITIVE — AB
PH UA: 5.5
PROTEIN UA: NEGATIVE
Spec Grav, UA: 1.02
UROBILINOGEN UA: 4

## 2016-09-06 LAB — POC MICROSCOPIC URINALYSIS (UMFC): Mucus: ABSENT

## 2016-09-06 MED ORDER — CIPROFLOXACIN HCL 500 MG PO TABS: 500.0000 mg | ORAL_TABLET | Freq: Two times a day (BID) | ORAL | 0 refills | Status: AC

## 2016-09-06 MED ORDER — NYSTATIN 100000 UNIT/GM EX POWD: Freq: Three times a day (TID) | CUTANEOUS | 4 refills | Status: DC

## 2016-09-06 NOTE — Patient Instructions (Addendum)
IF you received an x-ray today, you will receive an invoice from [facility]. Please contact [facility] at [phone] with questions or concerns regarding your invoice.   IF you received labwork today, you will receive an invoice from [facility]. Please contact [facility] at [phone] with questions or concerns regarding your invoice.   Our billing staff will not be able to assist you with questions regarding bills from these companies.  You will be contacted with the lab results as soon as they are available. The fastest way to get your results is to activate your My Chart account. Instructions are located on the last page of this paperwork. If you have not heard from [place] regarding the results in 2 weeks, please contact this office.      Intertrigo Introduction Intertrigo is skin irritation or inflammation (dermatitis) that occurs when folds of skin rub together. The irritation can cause a rash and make skin raw and itchy. This condition most commonly occurs in the skin folds of these areas:  Toes.  Armpits.  Groin.  Belly.  Breasts.  Buttocks. Intertrigo is not passed from person to person (is not contagious). What are the causes? This condition is caused by heat, moisture, friction, and lack of air circulation. The condition can be made worse by:  Sweat.  Bacteria or a fungus, such as yeast. What increases the risk? This condition is more likely to occur if you have moisture in your skin folds. It is also more likely to develop in people who:  Have diabetes.  Are overweight.  Are on bed rest.  Live in a warm and moist climate.  Wear splints, braces, or other medical devices.  Are not able to control their bowels or bladder (have incontinence). What are the signs or symptoms? Symptoms of this condition include:  A pink or red skin rash.  Brown patches on the skin.  Raw or scaly skin.  Itchiness.  A burning  feeling.  Bleeding.  Leaking fluid.  A bad smell. How is this diagnosed? This condition is diagnosed with a medical history and physical exam. You may also have a skin swab to test for bacteria or a fungus, such as yeast. How is this treated? Treatment may include:  Cleaning and drying your skin.  An oral antibiotic medicine or antibiotic skin cream for a bacterial infection.  Antifungal cream or pills for an infection that was caused by a fungus, such as yeast.  Steroid ointment to relieve itchiness and irritation. Follow these instructions at home:  Keep the affected area clean and dry.  Do not scratch your skin.  Stay in a cool environment as much as possible. Use an air conditioner or fan, if available.  Apply over-the-counter and prescription medicines only as told by your health care provider.  If you were prescribed an antibiotic medicine, use it as told by your health care provider. Do not stop using the antibiotic even if your condition improves.  Keep all follow-up visits as told by your health care provider. This is important. How is this prevented?  Maintain a healthy weight.  Take care of your feet, especially if you have diabetes. Foot care includes:  Wearing shoes that fit well.  Keeping your feet dry.  Wearing clean, breathable socks.  Protect the skin around your groin and buttocks, especially if you have incontinence. Skin protection includes:  Following a regular cleaning routine.  Using moisturizers and skin protectants.  Changing protection pads frequently.  Do not  wear tight clothes. Wear clothes that are loose and absorbent. Wear clothes that are made of cotton.  Wear a bra that gives good support, if needed.  Shower and dry yourself thoroughly after activity. Use a hair dryer on a cool setting to dry between skin folds, especially after you bathe.  If you have diabetes, keep your blood sugar under control. Contact a health care  provider if:  Your symptoms do not improve with treatment.  Your symptoms get worse or they spread.  You notice increased redness and warmth.  You have a fever. This information is not intended to replace advice given to you by your health care provider. Make sure you discuss any questions you have with your health care provider. Document Released: 08/11/2005 Document Revised: 01/17/2016 Document Reviewed: 02/12/2015  2017 Elsevier

## 2016-09-06 NOTE — Progress Notes (Signed)
Chief Complaint  Patient presents with  . right breast pain    HPI  Pt reports right breast pain for 1.5 months duration  There is a pain and a rash below the breast She has been putting monistat under the breast She has been using talcum powder from the baby section of the pharmacy to help keep the area under the breast dry.  She has an appointment for a mammogram in one week.   She denies fevers or chills She reports that she has not seen any nipple discharge She denies any pain or rash involving the left breast She uses powder under both breasts She states that she gets hot flashes  Barrett's Esophagus She reports that she has been having an EGD for Barrett's esophagus She is followed by GI She is on medications to help with reflux She does not smoke currently  Obesity Pt cares for her 16yo grandson and reports that she has been having difficulty with weight loss. She likes cookies, candy, chips and does not exercise She makes her own sweet tea Wt Readings from Last 3 Encounters:  09/06/16 235 lb (106.6 kg)  07/03/16 241 lb (109.3 kg)  01/01/16 230 lb (104.3 kg)   Urinary Symptoms She reports that she has burning with urination She uses vagisil wash to clean herself She denies blood in urine, flank pain, fevers or chills She reports that she has noted increasingly foul odor    Past Medical History:  Diagnosis Date  . BACK PAIN 07/23/2007  . CHEST PAIN 09/04/2010  . Cyst of right ovary 07/03/2015  . DEGENERATIVE JOINT DISEASE, LUMBAR SPINE 07/23/2007  . Gastric polyp    x3  . GERD 07/22/2007  . HYPERLIPIDEMIA 07/22/2007  . IBS 07/23/2007  . MITRAL VALVE PROLAPSE 07/22/2007  . SINUSITIS- ACUTE-NOS 09/09/2007  . TACHYCARDIA 08/20/2010  . Unspecified Vaginitis and Vulvovaginitis 12/17/2007    Current Outpatient Prescriptions  Medication Sig Dispense Refill  . ALPRAZolam (XANAX) 1 MG tablet TAKE 1 TABLET BY MOUTH 3 TIMES A DAY AS NEEDED FOR ANXIETY 90 tablet 5    . aspirin 81 MG EC tablet Take 81 mg by mouth daily.      . cyclobenzaprine (FLEXERIL) 5 MG tablet   1  . dexlansoprazole (DEXILANT) 60 MG capsule Take 1 capsule (60 mg total) by mouth daily. 90 capsule 3  . diclofenac sodium (VOLTAREN) 1 % GEL Apply 4 g topically 4 (four) times daily as needed. 400 g 11  . DULoxetine (CYMBALTA) 60 MG capsule Take 60 mg by mouth daily.    Marland Kitchen oxyCODONE-acetaminophen (PERCOCET) 10-325 MG tablet Take 1 tablet by mouth every 4 (four) hours as needed for pain.    . pravastatin (PRAVACHOL) 40 MG tablet Take 1 tablet (40 mg total) by mouth daily. 90 tablet 3  . pravastatin (PRAVACHOL) 40 MG tablet TAKE 1 TABLET BY MOUTH DAILY 90 tablet 2  . ciprofloxacin (CIPRO) 500 MG tablet Take 1 tablet (500 mg total) by mouth 2 (two) times daily. 6 tablet 0  . nystatin (MYCOSTATIN/NYSTOP) powder Apply topically 3 (three) times daily. 15 g 4   No current facility-administered medications for this visit.     Allergies:  Allergies  Allergen Reactions  . Atorvastatin     REACTION: more forgetful  . Penicillins     REACTION: turns blue  . Simvastatin     REACTION: more forgetful  . Tetracycline     REACTION: itch    Past Surgical History:  Procedure Laterality  Date  . ABDOMINAL HYSTERECTOMY    . CHOLECYSTECTOMY    . RLE tib/fib fx/surgury    . TONSILLECTOMY    . TUBAL LIGATION      Social History   Social History  . Marital status: Married    Spouse name: N/A  . Number of children: 3  . Years of education: N/A   Occupational History  . 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 Smoking status: Never Smoker  . Smokeless tobacco: Never Used  . Alcohol use No  . Drug use: No  . Sexual activity: No   Other Topics Concern  . None   Social History Narrative  . None   Family History  Problem Relation Age of Onset  . Colon polyps Mother   . Angina Mother   . Heart disease Mother     CVA  . Coronary artery  disease Mother   . Stroke Mother   . Hypertension Other   . Diabetes Other   . Hyperlipidemia      ROS SEE HPI  Objective: Vitals:   09/06/16 0823  BP: 122/72  Pulse: 87  Resp: 17  Temp: 97.6 F (36.4 C)  TempSrc: Oral  SpO2: 96%  Weight: 235 lb (106.6 kg)  Height: 5' 6.5" (1.689 m)  Body mass index is 37.36 kg/m.   Physical Exam  Constitutional: She appears well-developed and well-nourished.  HENT:  Head: Normocephalic and atraumatic.  Right Ear: External ear normal.  Left Ear: External ear normal.  Nose: Nose normal.  Mouth/Throat: Oropharynx is clear and moist.  Eyes: Conjunctivae and EOM are normal.  Neck: Normal range of motion. Neck supple.  Cardiovascular: Normal rate, regular rhythm and normal heart sounds.   No murmur heard. Pulmonary/Chest: Effort normal and breath sounds normal. No respiratory distress. She has no wheezes.  Abdominal: Soft. Bowel sounds are normal. She exhibits no distension and no mass. There is no tenderness. There is no guarding ( ).  No flank pain or suprapubic tenderness  Psychiatric: She has a normal mood and affect. Her behavior is normal. Judgment and thought content normal.    Assessment and Plan Molina was seen today for right breast pain.  Diagnoses and all orders for this visit:  Intertrigo- disucssed skin care and advised to stop vagisil and fragrant powders To use only water to wash beneath breast and apply nystatin powder Use desitin to keep dry during the summers -     nystatin (MYCOSTATIN/NYSTOP) powder; Apply topically 3 (three) times daily.  Foul smelling urine- pt has UTI, discussed that vigorous washing of the vagina can bruise the urethra and create a nidus for infection especially when combined with feminine washes -     POCT urinalysis dipstick -     POCT Microscopic Urinalysis ([facility])  Acute cystitis without hematuria -     ciprofloxacin (CIPRO) 500 MG tablet; Take 1 tablet (500 mg total) by mouth 2 (two)  times daily.  Barrett's esophagus without dysplasia- continue with Gi, continue meds  Class 2 obesity due to excess calories with serious comorbidity and body mass index (BMI) of 37.0 to 37.9 in adult- discussed that she has to do calorie restriction and increase movement to lose weight Discussed realistic goals Pt should follow up with PCP in 3 months for her weight loss follow up  A total of 45 minutes were spent face-to-face with the patient during this encounter and over half of that time  was spent on counseling and coordination of care.       [place]

## 2016-09-12 ENCOUNTER — Ambulatory Visit
Admission: RE | Admit: 2016-09-12 | Discharge: 2016-09-12 | Disposition: A | Discharge status: Home / Self Care | Payer: BLUE CROSS/BLUE SHIELD | Source: Ambulatory Visit | Attending: Obstetrics & Gynecology | Admitting: Obstetrics & Gynecology

## 2016-09-12 DIAGNOSIS — Z1231 Encounter for screening mammogram for malignant neoplasm of breast: Secondary | ICD-10-CM

## 2016-09-29 DIAGNOSIS — K635 Polyp of colon: Secondary | ICD-10-CM | POA: Diagnosis not present

## 2016-09-29 DIAGNOSIS — Z1211 Encounter for screening for malignant neoplasm of colon: Secondary | ICD-10-CM | POA: Diagnosis not present

## 2016-10-01 DIAGNOSIS — K635 Polyp of colon: Secondary | ICD-10-CM | POA: Diagnosis not present

## 2016-10-01 DIAGNOSIS — K529 Noninfective gastroenteritis and colitis, unspecified: Secondary | ICD-10-CM | POA: Diagnosis not present

## 2016-10-03 DIAGNOSIS — M5136 Other intervertebral disc degeneration, lumbar region: Secondary | ICD-10-CM | POA: Diagnosis not present

## 2016-10-03 DIAGNOSIS — G894 Chronic pain syndrome: Secondary | ICD-10-CM | POA: Diagnosis not present

## 2016-10-03 DIAGNOSIS — M5416 Radiculopathy, lumbar region: Secondary | ICD-10-CM | POA: Diagnosis not present

## 2016-10-03 DIAGNOSIS — M47816 Spondylosis without myelopathy or radiculopathy, lumbar region: Secondary | ICD-10-CM | POA: Diagnosis not present

## 2016-10-14 DIAGNOSIS — Z86010 Personal history of colonic polyps: Secondary | ICD-10-CM | POA: Diagnosis not present

## 2016-10-31 DIAGNOSIS — M5136 Other intervertebral disc degeneration, lumbar region: Secondary | ICD-10-CM | POA: Diagnosis not present

## 2016-10-31 DIAGNOSIS — M47816 Spondylosis without myelopathy or radiculopathy, lumbar region: Secondary | ICD-10-CM | POA: Diagnosis not present

## 2016-10-31 DIAGNOSIS — G894 Chronic pain syndrome: Secondary | ICD-10-CM | POA: Diagnosis not present

## 2016-10-31 DIAGNOSIS — M5416 Radiculopathy, lumbar region: Secondary | ICD-10-CM | POA: Diagnosis not present

## 2016-11-01 ENCOUNTER — Ambulatory Visit (INDEPENDENT_AMBULATORY_CARE_PROVIDER_SITE_OTHER): Payer: BLUE CROSS/BLUE SHIELD | Admitting: Family Medicine

## 2016-11-01 VITALS — BP 124/94 | HR 97 | Temp 97.9°F | Resp 16 | Ht 66.0 in | Wt 234.0 lb

## 2016-11-01 DIAGNOSIS — Z131 Encounter for screening for diabetes mellitus: Secondary | ICD-10-CM

## 2016-11-01 DIAGNOSIS — R350 Frequency of micturition: Secondary | ICD-10-CM | POA: Diagnosis not present

## 2016-11-01 DIAGNOSIS — R7303 Prediabetes: Secondary | ICD-10-CM | POA: Diagnosis not present

## 2016-11-01 DIAGNOSIS — R631 Polydipsia: Secondary | ICD-10-CM

## 2016-11-01 LAB — POC MICROSCOPIC URINALYSIS (UMFC): Mucus: ABSENT

## 2016-11-01 LAB — POCT URINALYSIS DIP (MANUAL ENTRY)
Bilirubin, UA: NEGATIVE
Glucose, UA: NEGATIVE
Ketones, POC UA: NEGATIVE
Nitrite, UA: NEGATIVE
Protein Ur, POC: NEGATIVE
Spec Grav, UA: 1.01
Urobilinogen, UA: 2
pH, UA: 6.5

## 2016-11-01 LAB — POCT GLYCOSYLATED HEMOGLOBIN (HGB A1C): Hemoglobin A1C: 5.7

## 2016-11-01 NOTE — Patient Instructions (Addendum)
IF you received an x-ray today, you will receive an invoice from [facility]. Please contact [facility] at [phone] with questions or concerns regarding your invoice.   IF you received labwork today, you will receive an invoice from [facility]. Please contact [facility] at [phone] with questions or concerns regarding your invoice.   Our billing staff will not be able to assist you with questions regarding bills from these companies.  You will be contacted with the lab results as soon as they are available. The fastest way to get your results is to activate your My Chart account. Instructions are located on the last page of this paperwork. If you have not heard from [place] regarding the results in 2 weeks, please contact this office.      Preventing Type 2 Diabetes Mellitus Type 2 diabetes (type 2 diabetes mellitus) is a long-term (chronic) disease that affects blood sugar (glucose) levels. Normally, a hormone called insulin allows glucose to enter cells in the body. The cells use glucose for energy. In type 2 diabetes, one or both of these problems may be present:  The body does not make enough insulin.  The body does not respond properly to insulin that it makes (insulin resistance). Insulin resistance or lack of insulin causes excess glucose to build up in the blood instead of going into cells. As a result, high blood glucose (hyperglycemia) develops, which can cause many complications. Being overweight or obese and having an inactive (sedentary) lifestyle can increase your risk for diabetes. Type 2 diabetes can be delayed or prevented by making certain nutrition and lifestyle changes. What nutrition changes can be made?  Eat healthy meals and snacks regularly. Keep a healthy snack with you for when you get hungry between meals, such as fruit or a handful of nuts.  Eat lean meats and proteins that are low in saturated fats, such as chicken, fish, egg whites, and  beans. Avoid processed meats.  Eat plenty of fruits and vegetables and plenty of grains that have not been processed (whole grains). It is recommended that you eat:  1?2 cups of fruit every day.  2?3 cups of vegetables every day.  6?8 oz of whole grains every day, such as oats, whole wheat, bulgur, brown rice, quinoa, and millet.  Eat low-fat dairy products, such as milk, yogurt, and cheese.  Eat foods that contain healthy fats, such as nuts, avocado, olive oil, and canola oil.  Drink water throughout the day. Avoid drinks that contain added sugar, such as soda or sweet tea.  Follow instructions from your health care provider about specific eating or drinking restrictions.  Control how much food you eat at a time (portion size).  Check food labels to find out the serving sizes of foods.  Use a kitchen scale to weigh amounts of foods.  Saute or steam food instead of frying it. Cook with water or broth instead of oils or butter.  Limit your intake of:  Salt (sodium). Have no more than 1 tsp (2,400 mg) of sodium a day. If you have heart disease or high blood pressure, have less than ? tsp (1,500 mg) of sodium a day.  Saturated fat. This is fat that is solid at room temperature, such as butter or fat on meat. What lifestyle changes can be made?   Activity   Do moderate-intensity physical activity for at least 30 minutes on at least 5 days of the week, or as much as told by your health care  provider.  Ask your health care provider what activities are safe for you. A mix of physical activities may be best, such as walking, swimming, cycling, and strength training.  Try to add physical activity into your day. For example:  Park in spots that are farther away than usual, so that you walk more. For example, park in a far corner of the parking lot when you go to the office or the grocery store.  Take a walk during your lunch break.  Use stairs instead of elevators or  escalators. Weight Loss   Lose weight as directed. Your health care provider can determine how much weight loss is best for you and can help you lose weight safely.  If you are overweight or obese, you may be instructed to lose at least 5?7 % of your body weight. Alcohol and Tobacco      Do not use any tobacco products, such as cigarettes, chewing tobacco, and e-cigarettes. If you need help quitting, ask your health care provider. Work With [place] Provider   Have your blood glucose tested regularly, as told by your health care provider.  Discuss your risk factors and how you can reduce your risk for diabetes.  Get screening tests as told by your health care provider. You may have screening tests regularly, especially if you have certain risk factors for type 2 diabetes.  Make an appointment with a diet and nutrition specialist (registered dietitian). A registered dietitian can help you make a healthy eating plan and can help you understand portion sizes and food labels. Why are these changes important?  It is possible to prevent or delay type 2 diabetes and related health problems by making lifestyle and nutrition changes.  It can be difficult to recognize signs of type 2 diabetes. The best way to avoid possible damage to your body is to take actions to prevent the disease before you develop symptoms. What can happen if changes are not made?  Your blood glucose levels may keep increasing. Having high blood glucose for a long time is dangerous. Too much glucose in your blood can damage your blood vessels, heart, kidneys, nerves, and eyes.  You may develop prediabetes or type 2 diabetes. Type 2 diabetes can lead to many chronic health problems and complications, such as:  Heart disease.  Stroke.  Blindness.  Kidney disease.  Depression.  Poor circulation in the feet and legs, which could lead to surgical removal (amputation) in severe cases. Where to find  support:  Ask your health care provider to recommend a registered dietitian, diabetes educator, or weight loss program.  Look for local or online weight loss groups.  Join a gym, fitness club, or outdoor activity group, such as a walking club. Where to find more information: To learn more about diabetes and diabetes prevention, visit:  American Diabetes Association (ADA): www.diabetes.[employer] of Diabetes and Digestive and Kidney Diseases: FindSpin.nl To learn more about healthy eating, visit:  The U.S. Department of Agriculture Scientist, research (physical sciences)), Choose My Plate: http://wiley-williams.com/  Office of Disease Prevention and Health Promotion (ODPHP), Dietary Guidelines: SurferLive.at Summary  You can reduce your risk for type 2 diabetes by increasing your physical activity, eating healthy foods, and losing weight as directed.  Talk with your health care provider about your risk for type 2 diabetes. Ask about any blood tests or screening tests that you need to have. This information is not intended to replace advice given to you by your health care provider. Make  sure you discuss any questions you have with your health care provider. Document Released: 12/03/2015 Document Revised: 01/17/2016 Document Reviewed: 10/02/2015 Elsevier Interactive Patient Education  2017 [employer].

## 2016-11-01 NOTE — Progress Notes (Signed)
Chief Complaint  Patient presents with  . Urinary Frequency    x 2 days    HPI   Dysuria Pt reports that for 2 days she has been having increased frequency No hematuria No flank pain, no urgency She reports that she has been drinking lots of sweet tea so is not sure if that is the problem She denies vaginal discharge  Obesity Pt reports that she is not exercising She is making some changes like cutting out potato chips She admits that she makes her own sweet tea and drinks it all day. She is concerned about her risk of diabetes.  She reports that she drinks water all day and is thirsty.  Lab Results  Component Value Date   HGBA1C 5.7 11/01/2016   Wt Readings from Last 3 Encounters:  11/01/16 234 lb (106.1 kg)  09/06/16 235 lb (106.6 kg)  07/03/16 241 lb (109.3 kg)     Past Medical History:  Diagnosis Date  . BACK PAIN 07/23/2007  . CHEST PAIN 09/04/2010  . Cyst of right ovary 07/03/2015  . DEGENERATIVE JOINT DISEASE, LUMBAR SPINE 07/23/2007  . Gastric polyp    x3  . GERD 07/22/2007  . HYPERLIPIDEMIA 07/22/2007  . IBS 07/23/2007  . MITRAL VALVE PROLAPSE 07/22/2007  . SINUSITIS- ACUTE-NOS 09/09/2007  . TACHYCARDIA 08/20/2010  . Unspecified Vaginitis and Vulvovaginitis 12/17/2007    Current Outpatient Prescriptions  Medication Sig Dispense Refill  . ALPRAZolam (XANAX) 1 MG tablet TAKE 1 TABLET BY MOUTH 3 TIMES A DAY AS NEEDED FOR ANXIETY 90 tablet 5  . aspirin 81 MG EC tablet Take 81 mg by mouth daily.      . cyclobenzaprine (FLEXERIL) 5 MG tablet   1  . dexlansoprazole (DEXILANT) 60 MG capsule Take 1 capsule (60 mg total) by mouth daily. 90 capsule 3  . diclofenac sodium (VOLTAREN) 1 % GEL Apply 4 g topically 4 (four) times daily as needed. 400 g 11  . DULoxetine (CYMBALTA) 60 MG capsule Take 90 mg by mouth daily.     Marland Kitchen nystatin (MYCOSTATIN/NYSTOP) powder Apply topically 3 (three) times daily. 15 g 4  . oxyCODONE-acetaminophen (PERCOCET) 10-325 MG tablet Take 1  tablet by mouth every 4 (four) hours as needed for pain.    . pravastatin (PRAVACHOL) 40 MG tablet Take 1 tablet (40 mg total) by mouth daily. 90 tablet 3  . pravastatin (PRAVACHOL) 40 MG tablet TAKE 1 TABLET BY MOUTH DAILY (Patient not taking: Reported on 11/01/2016) 90 tablet 2   No current facility-administered medications for this visit.     Allergies:  Allergies  Allergen Reactions  . Atorvastatin     REACTION: more forgetful  . Penicillins     REACTION: turns blue  . Simvastatin     REACTION: more forgetful  . Tetracycline     REACTION: itch    Past Surgical History:  Procedure Laterality Date  . ABDOMINAL HYSTERECTOMY    . CHOLECYSTECTOMY    . RLE tib/fib fx/surgury    . TONSILLECTOMY    . TUBAL LIGATION      Social History   Social History  . Marital status: Married    Spouse name: N/A  . Number of children: 3  . Years of education: N/A   Occupational History  . 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 Smoking status: Never Smoker  . Smokeless tobacco: Never Used  . Alcohol use No  . Drug use: No  .  Sexual activity: No   Other Topics Concern  . None   Social History Narrative  . None    Review of Systems  Constitutional: Negative for chills and fever.  Gastrointestinal: Negative for abdominal pain, nausea and vomiting.  Skin: Negative for itching and rash.    Objective: Vitals:   11/01/16 1336  BP: (!) 124/94  Pulse: 97  Resp: 16  Temp: 97.9 F (36.6 C)  SpO2: 97%  Weight: 234 lb (106.1 kg)  Height: 5\' 6"  (1.676 m)    Physical Exam  Constitutional: She is oriented to person, place, and time. She appears well-developed and well-nourished.  HENT:  Head: Normocephalic and atraumatic.  Cardiovascular: Normal rate, regular rhythm and normal heart sounds.   Pulmonary/Chest: Effort normal and breath sounds normal. No respiratory distress. She has no wheezes. She has no rales.  Abdominal: Soft.  Bowel sounds are normal. She exhibits no distension and no mass. There is no tenderness. There is no guarding.  Musculoskeletal:  No flank pain  Neurological: She is alert and oriented to person, place, and time.  Skin: Skin is warm. Capillary refill takes less than 2 seconds. No erythema.  Psychiatric: She has a normal mood and affect. Her behavior is normal. Judgment and thought content normal.    Assessment and Plan Pamela Gibson was seen today for urinary frequency.  Diagnoses and all orders for this visit:  Urinary frequency- no nitrites noted Pt with LE and blood as well as bacteria on the microscopic exam Will send culture Discussed increasing hydration -     POCT Microscopic Urinalysis ([facility]) -     POCT urinalysis dipstick -     POCT glycosylated hemoglobin (Hb A1C) -     Urine culture  Screening for diabetes mellitus-  Discussed risk factors for diabetes and diabetes prevention -     POCT glycosylated hemoglobin (Hb A1C)  Polydipsia- discussed that sweet tea does not hydrate and advised water for hydration -     POCT glycosylated hemoglobin (Hb A1C)  Prediabetes- pt does not have a good understanding of glycemic index and simple vs. Complex carbs She is willing to cook thus will send her to diabetic education as a part of the prevention plan -     Ambulatory referral to diabetic education     [place]

## 2016-11-03 ENCOUNTER — Telehealth: Payer: Self-pay | Admitting: Family Medicine

## 2016-11-03 NOTE — Telephone Encounter (Signed)
Called pt to check on her symptoms She reports that she is still urinating frequently without fevers and chills She has some mild back pain She denies vaginal bleeding She is drinking plenty of water  Discussed with pt that she may have a UTI or a kidney stone If there was no UTI on the urine culture then her symptoms may be due to a kidney stone She should follow up in the clinic if the urine culture is negative.

## 2016-11-04 LAB — URINE CULTURE

## 2016-11-05 ENCOUNTER — Telehealth: Payer: Self-pay | Admitting: Family Medicine

## 2016-11-05 MED ORDER — SULFAMETHOXAZOLE-TRIMETHOPRIM 800-160 MG PO TABS: 1.0000 | ORAL_TABLET | Freq: Two times a day (BID) | ORAL | 0 refills | Status: AC

## 2016-11-05 NOTE — Telephone Encounter (Signed)
Discussed that she should take azo, drink plenty, discussed allergies Will send Bactrim DS since pt has no allergy Discussed avoiding alcohol with bactrim Discussed that she should follow up if symptoms persist after completing abx

## 2016-11-28 DIAGNOSIS — M47816 Spondylosis without myelopathy or radiculopathy, lumbar region: Secondary | ICD-10-CM | POA: Diagnosis not present

## 2016-11-28 DIAGNOSIS — M5416 Radiculopathy, lumbar region: Secondary | ICD-10-CM | POA: Diagnosis not present

## 2016-11-28 DIAGNOSIS — G894 Chronic pain syndrome: Secondary | ICD-10-CM | POA: Diagnosis not present

## 2016-11-28 DIAGNOSIS — M5136 Other intervertebral disc degeneration, lumbar region: Secondary | ICD-10-CM | POA: Diagnosis not present

## 2016-12-26 DIAGNOSIS — M5136 Other intervertebral disc degeneration, lumbar region: Secondary | ICD-10-CM | POA: Diagnosis not present

## 2016-12-26 DIAGNOSIS — M47816 Spondylosis without myelopathy or radiculopathy, lumbar region: Secondary | ICD-10-CM | POA: Diagnosis not present

## 2016-12-26 DIAGNOSIS — G894 Chronic pain syndrome: Secondary | ICD-10-CM | POA: Diagnosis not present

## 2016-12-26 DIAGNOSIS — M5416 Radiculopathy, lumbar region: Secondary | ICD-10-CM | POA: Diagnosis not present

## 2017-01-06 ENCOUNTER — Other Ambulatory Visit (INDEPENDENT_AMBULATORY_CARE_PROVIDER_SITE_OTHER): Payer: BLUE CROSS/BLUE SHIELD

## 2017-01-06 DIAGNOSIS — Z0001 Encounter for general adult medical examination with abnormal findings: Secondary | ICD-10-CM | POA: Diagnosis not present

## 2017-01-06 LAB — CBC WITH DIFFERENTIAL/PLATELET
BASOS PCT: 0.8 % (ref 0.0–3.0)
Basophils Absolute: 0.1 10*3/uL (ref 0.0–0.1)
Eosinophils Absolute: 0.2 10*3/uL (ref 0.0–0.7)
Eosinophils Relative: 1.9 % (ref 0.0–5.0)
HCT: 45 % (ref 36.0–46.0)
HEMOGLOBIN: 15.4 g/dL — AB (ref 12.0–15.0)
LYMPHS ABS: 3.7 10*3/uL (ref 0.7–4.0)
Lymphocytes Relative: 40.6 % (ref 12.0–46.0)
MCHC: 34.2 g/dL (ref 30.0–36.0)
MCV: 88.5 fl (ref 78.0–100.0)
MONO ABS: 0.7 10*3/uL (ref 0.1–1.0)
Monocytes Relative: 8 % (ref 3.0–12.0)
Neutro Abs: 4.5 10*3/uL (ref 1.4–7.7)
Neutrophils Relative %: 48.7 % (ref 43.0–77.0)
Platelets: 280 10*3/uL (ref 150.0–400.0)
RBC: 5.08 Mil/uL (ref 3.87–5.11)
RDW: 13.2 % (ref 11.5–15.5)
WBC: 9.2 10*3/uL (ref 4.0–10.5)

## 2017-01-06 LAB — BASIC METABOLIC PANEL
BUN: 12 mg/dL (ref 6–23)
CHLORIDE: 103 meq/L (ref 96–112)
CO2: 29 mEq/L (ref 19–32)
Calcium: 9.2 mg/dL (ref 8.4–10.5)
Creatinine, Ser: 0.99 mg/dL (ref 0.40–1.20)
GFR: 62.7 mL/min (ref >60.00)
GLUCOSE: 94 mg/dL (ref 70–99)
POTASSIUM: 3.7 meq/L (ref 3.5–5.1)
Sodium: 138 mEq/L (ref 135–145)

## 2017-01-06 LAB — URINALYSIS, ROUTINE W REFLEX MICROSCOPIC
BILIRUBIN URINE: NEGATIVE
KETONES UR: NEGATIVE
LEUKOCYTES UA: NEGATIVE
NITRITE: NEGATIVE
PH: 6.5 (ref 5.0–8.0)
Specific Gravity, Urine: 1.015 (ref 1.000–1.030)
Total Protein, Urine: NEGATIVE
Urine Glucose: NEGATIVE
Urobilinogen, UA: 2 — AB (ref 0.0–1.0)

## 2017-01-06 LAB — HEPATIC FUNCTION PANEL
ALBUMIN: 4.1 g/dL (ref 3.5–5.2)
ALT: 26 U/L (ref 0–35)
AST: 21 U/L (ref 0–37)
Alkaline Phosphatase: 53 U/L (ref 39–117)
Bilirubin, Direct: 0.2 mg/dL (ref 0.0–0.3)
Total Bilirubin: 0.6 mg/dL (ref 0.2–1.2)
Total Protein: 6.9 g/dL (ref 6.0–8.3)

## 2017-01-06 LAB — LIPID PANEL
CHOLESTEROL: 142 mg/dL (ref 0–200)
HDL: 44.2 mg/dL (ref >39.00)
LDL Cholesterol: 69 mg/dL (ref 0–99)
NonHDL: 98.05
TRIGLYCERIDES: 144 mg/dL (ref 0.0–149.0)
Total CHOL/HDL Ratio: 3
VLDL: 28.8 mg/dL (ref 0.0–40.0)

## 2017-01-06 LAB — TSH: TSH: 2.38 u[IU]/mL (ref 0.35–4.50)

## 2017-01-09 ENCOUNTER — Other Ambulatory Visit: Payer: Self-pay | Admitting: Internal Medicine

## 2017-01-09 ENCOUNTER — Ambulatory Visit (INDEPENDENT_AMBULATORY_CARE_PROVIDER_SITE_OTHER): Payer: BLUE CROSS/BLUE SHIELD | Admitting: Internal Medicine

## 2017-01-09 ENCOUNTER — Other Ambulatory Visit (INDEPENDENT_AMBULATORY_CARE_PROVIDER_SITE_OTHER): Payer: BLUE CROSS/BLUE SHIELD

## 2017-01-09 ENCOUNTER — Telehealth: Payer: Self-pay | Admitting: Internal Medicine

## 2017-01-09 ENCOUNTER — Encounter: Payer: Self-pay | Admitting: Internal Medicine

## 2017-01-09 VITALS — BP 120/84 | HR 87 | Temp 97.9°F | Ht 66.0 in | Wt 234.0 lb

## 2017-01-09 DIAGNOSIS — Z0000 Encounter for general adult medical examination without abnormal findings: Secondary | ICD-10-CM

## 2017-01-09 DIAGNOSIS — R3129 Other microscopic hematuria: Secondary | ICD-10-CM | POA: Diagnosis not present

## 2017-01-09 DIAGNOSIS — G4700 Insomnia, unspecified: Secondary | ICD-10-CM | POA: Diagnosis not present

## 2017-01-09 DIAGNOSIS — E785 Hyperlipidemia, unspecified: Secondary | ICD-10-CM | POA: Diagnosis not present

## 2017-01-09 DIAGNOSIS — Z114 Encounter for screening for human immunodeficiency virus [HIV]: Secondary | ICD-10-CM

## 2017-01-09 LAB — CBC WITH DIFFERENTIAL/PLATELET
BASOS ABS: 0.1 10*3/uL (ref 0.0–0.1)
Basophils Relative: 1.3 % (ref 0.0–3.0)
EOS ABS: 0.1 10*3/uL (ref 0.0–0.7)
Eosinophils Relative: 1.5 % (ref 0.0–5.0)
HEMATOCRIT: 46.5 % — AB (ref 36.0–46.0)
Hemoglobin: 15.7 g/dL — ABNORMAL HIGH (ref 12.0–15.0)
LYMPHS PCT: 33.8 % (ref 12.0–46.0)
Lymphs Abs: 3 10*3/uL (ref 0.7–4.0)
MCHC: 33.7 g/dL (ref 30.0–36.0)
MCV: 89.3 fl (ref 78.0–100.0)
Monocytes Absolute: 0.7 10*3/uL (ref 0.1–1.0)
Monocytes Relative: 7.7 % (ref 3.0–12.0)
NEUTROS ABS: 4.9 10*3/uL (ref 1.4–7.7)
Neutrophils Relative %: 55.7 % (ref 43.0–77.0)
Platelets: 298 10*3/uL (ref 150.0–400.0)
RBC: 5.21 Mil/uL — ABNORMAL HIGH (ref 3.87–5.11)
RDW: 13.1 % (ref 11.5–15.5)
WBC: 8.9 10*3/uL (ref 4.0–10.5)

## 2017-01-09 LAB — URINALYSIS, ROUTINE W REFLEX MICROSCOPIC
BILIRUBIN URINE: NEGATIVE
Ketones, ur: NEGATIVE
LEUKOCYTES UA: NEGATIVE
NITRITE: NEGATIVE
Specific Gravity, Urine: 1.02 (ref 1.000–1.030)
Total Protein, Urine: NEGATIVE
Urine Glucose: NEGATIVE
Urobilinogen, UA: 4 — AB (ref 0.0–1.0)
pH: 6 (ref 5.0–8.0)

## 2017-01-09 LAB — HEPATIC FUNCTION PANEL
ALT: 29 U/L (ref 0–35)
AST: 22 U/L (ref 0–37)
Albumin: 4.2 g/dL (ref 3.5–5.2)
Alkaline Phosphatase: 62 U/L (ref 39–117)
BILIRUBIN DIRECT: 0.1 mg/dL (ref 0.0–0.3)
Total Bilirubin: 0.5 mg/dL (ref 0.2–1.2)
Total Protein: 7.1 g/dL (ref 6.0–8.3)

## 2017-01-09 LAB — LIPID PANEL
Cholesterol: 148 mg/dL (ref 0–200)
HDL: 45.1 mg/dL (ref >39.00)
LDL Cholesterol: 73 mg/dL (ref 0–99)
NONHDL: 102.59
TRIGLYCERIDES: 149 mg/dL (ref 0.0–149.0)
Total CHOL/HDL Ratio: 3
VLDL: 29.8 mg/dL (ref 0.0–40.0)

## 2017-01-09 LAB — BASIC METABOLIC PANEL
BUN: 11 mg/dL (ref 6–23)
CHLORIDE: 105 meq/L (ref 96–112)
CO2: 29 mEq/L (ref 19–32)
CREATININE: 0.96 mg/dL (ref 0.40–1.20)
Calcium: 9.5 mg/dL (ref 8.4–10.5)
GFR: 64.97 mL/min (ref >60.00)
Glucose, Bld: 97 mg/dL (ref 70–99)
Potassium: 4.2 mEq/L (ref 3.5–5.1)
Sodium: 140 mEq/L (ref 135–145)

## 2017-01-09 LAB — HIV ANTIBODY (ROUTINE TESTING W REFLEX): HIV: NONREACTIVE

## 2017-01-09 LAB — TSH: TSH: 1.56 u[IU]/mL (ref 0.35–4.50)

## 2017-01-09 MED ORDER — ZOLPIDEM TARTRATE 10 MG PO TABS: 10.0000 mg | ORAL_TABLET | Freq: Every evening | ORAL | 1 refills | Status: DC | PRN

## 2017-01-09 NOTE — Telephone Encounter (Signed)
Order is already in, will you please sign the order? Thank you!

## 2017-01-09 NOTE — Patient Instructions (Signed)
Please take all new medication as prescribed - the ambien for sleep  Please continue all other medications as before, and refills have been done if requested.  Please have the pharmacy call with any other refills you may need.  Please continue your efforts at being more active, low cholesterol diet, and weight control.  You are otherwise up to date with prevention measures today.  Please keep your appointments with your specialists as you may have planned  You will be contacted regarding the referral for: CT scan, and referral to Dr Felipa Eth Urology  Please go to the LAB in the Basement (turn left off the elevator) for the tests to be done today - just the HIV test today  You will be contacted by phone if any changes need to be made immediately.  Otherwise, you will receive a letter about your results with an explanation, but please check with MyChart first.  Please remember to sign up for MyChart if you have not done so, as this will be important to you in the future with finding out test results, communicating by private email, and scheduling acute appointments online when needed.  Please return in 1 year for your yearly visit, or sooner if needed, with Lab testing done 3-5 days before

## 2017-01-09 NOTE — Telephone Encounter (Signed)
States is trying to schedule patient.  Order needs to states "with and w/out contrast" and resent.

## 2017-01-09 NOTE — Assessment & Plan Note (Signed)

## 2017-01-09 NOTE — Progress Notes (Signed)
Subjective:    Patient ID: Pamela Gibson, female    DOB: 01-03-1966, 51 y.o.   MRN: [id number]  HPI  Here for wellness and f/u;  Overall doing ok;  Pt denies Chest pain, worsening SOB, DOE, wheezing, orthopnea, PND, worsening LE edema, palpitations, dizziness or syncope.  Pt denies neurological change such as new headache, facial or extremity weakness.  Pt denies polydipsia, polyuria, or low sugar symptoms. Pt states overall good compliance with treatment and medications, good tolerability, and has been trying to follow appropriate diet.  Pt denies worsening depressive symptoms, suicidal ideation or panic. No fever, night sweats, wt loss, loss of appetite, or other constitutional symptoms.  Pt states good ability with ADL's, has low fall risk, home safety reviewed and adequate, no other significant changes in hearing or vision, and only occasionally active with exercise. Has not been able to lose wt, but is not walking daily likeher 48yo husband who recently lost 20 lbs.  She has chronic back pain and recent bursitis, has been seeing pain management but she has declined cortisone shots to lower back and hip due to fear of pain. No new complaints Wt Readings from Last 3 Encounters:  01/09/17 234 lb (106.1 kg)  11/01/16 234 lb (106.1 kg)  09/06/16 235 lb (106.6 kg)   BP Readings from Last 3 Encounters:  01/09/17 120/84  11/01/16 (!) 124/94  09/06/16 122/72  Rx cymbalt 90 mg and percocet per pain management, recently off muscle relaxer.  Having much difficulty recently with getting to sleep.  Only sleeps about 2 hrs then wakes up.  No sleep apnea symptoms or daytime somnolence. Dx with [facility] per Gi at [facility] and has plan for f/u EGD towards end of 2018  Past Medical History:  Diagnosis Date  . BACK PAIN 07/23/2007  . CHEST PAIN 09/04/2010  . Cyst of right ovary 07/03/2015  . DEGENERATIVE JOINT DISEASE, LUMBAR SPINE 07/23/2007  . Gastric polyp    x3  . GERD 07/22/2007  .  HYPERLIPIDEMIA 07/22/2007  . IBS 07/23/2007  . MITRAL VALVE PROLAPSE 07/22/2007  . SINUSITIS- ACUTE-NOS 09/09/2007  . TACHYCARDIA 08/20/2010  . Unspecified Vaginitis and Vulvovaginitis 12/17/2007   Past Surgical History:  Procedure Laterality Date  . ABDOMINAL HYSTERECTOMY    . CHOLECYSTECTOMY    . RLE tib/fib fx/surgury    . TONSILLECTOMY    . TUBAL LIGATION      reports that she has never smoked. She has never used smokeless tobacco. She reports that she does not drink alcohol or use drugs. family history includes Angina in her mother; Colon polyps in her mother; Coronary artery disease in her mother; Diabetes in her other; Heart disease in her mother; Hypertension in her other; Stroke in her mother. Allergies  Allergen Reactions  . Atorvastatin     REACTION: more forgetful  . Penicillins     REACTION: turns blue  . Simvastatin     REACTION: more forgetful  . Tetracycline     REACTION: itch   Current Outpatient Prescriptions on File Prior to Visit  Medication Sig Dispense Refill  . ALPRAZolam (XANAX) 1 MG tablet TAKE 1 TABLET BY MOUTH 3 TIMES A DAY AS NEEDED FOR ANXIETY 90 tablet 5  . aspirin 81 MG EC tablet Take 81 mg by mouth daily.      Marland Kitchen dexlansoprazole (DEXILANT) 60 MG capsule Take 1 capsule (60 mg total) by mouth daily. 90 capsule 3  . diclofenac sodium (VOLTAREN) 1 % GEL Apply 4  g topically 4 (four) times daily as needed. 400 g 11  . DULoxetine (CYMBALTA) 60 MG capsule Take 90 mg by mouth daily.     Marland Kitchen nystatin (MYCOSTATIN/NYSTOP) powder Apply topically 3 (three) times daily. 15 g 4  . oxyCODONE-acetaminophen (PERCOCET) 10-325 MG tablet Take 1 tablet by mouth every 4 (four) hours as needed for pain.    . pravastatin (PRAVACHOL) 40 MG tablet Take 1 tablet (40 mg total) by mouth daily. 90 tablet 3   No current facility-administered medications on file prior to visit.    Review of Systems Constitutional: Negative for other unusual diaphoresis, sweats, appetite or weight  changes HENT: Negative for other worsening hearing loss, ear pain, facial swelling, mouth sores or neck stiffness.   Eyes: Negative for other worsening pain, redness or other visual disturbance.  Respiratory: Negative for other stridor or swelling Cardiovascular: Negative for other palpitations or other chest pain  Gastrointestinal: Negative for worsening diarrhea or loose stools, blood in stool, distention or other pain Genitourinary: Negative for hematuria, flank pain or other change in urine volume.  Musculoskeletal: Negative for myalgias or other joint swelling.  Skin: Negative for other color change, or other wound or worsening drainage.  Neurological: Negative for other syncope or numbness. Hematological: Negative for other adenopathy or swelling Psychiatric/Behavioral: Negative for hallucinations, other worsening agitation, SI, self-injury, or new decreased concentration All other system neg per pt    Objective:   Physical Exam BP 120/84 (BP Location: Left Arm, Patient Position: Sitting, Cuff Size: Large)   Pulse 87   Temp 97.9 F (36.6 C) (Oral)   Ht 5\' 6"  (1.676 m)   Wt 234 lb (106.1 kg)   SpO2 98%   BMI 37.77 kg/m  VS noted, morbid obese Constitutional: Pt is oriented to person, place, and time. Appears well-developed and well-nourished, in no significant distress and comfortable Head: Normocephalic and atraumatic  Eyes: Conjunctivae and EOM are normal. Pupils are equal, round, and reactive to light Right Ear: External ear normal without discharge Left Ear: External ear normal without discharge Nose: Nose without discharge or deformity Mouth/Throat: Oropharynx is without other ulcerations and moist  Neck: Normal range of motion. Neck supple. No JVD present. No tracheal deviation present or significant neck LA or mass Cardiovascular: Normal rate, regular rhythm, normal heart sounds and intact distal pulses.   Pulmonary/Chest: WOB normal and breath sounds without rales or  wheezing  Abdominal: Soft. Bowel sounds are normal. NT. No HSM  Musculoskeletal: Normal range of motion. Exhibits no edema Lymphadenopathy: Has no other cervical adenopathy.  Neurological: Pt is alert and oriented to person, place, and time. Pt has normal reflexes. No cranial nerve deficit. Motor grossly intact, Gait intact Skin: Skin is warm and dry. No rash noted or new ulcerations Psychiatric:  Has normal mood and affect. Behavior is normal without agitation No other exam findings  Lab Results  Component Value Date   WBC 9.2 01/06/2017   HGB 15.4 (H) 01/06/2017   HCT 45.0 01/06/2017   PLT 280.0 01/06/2017   GLUCOSE 94 01/06/2017   CHOL 142 01/06/2017   TRIG 144.0 01/06/2017   HDL 44.20 01/06/2017   LDLDIRECT 128.8 07/18/2008   LDLCALC 69 01/06/2017   ALT 26 01/06/2017   AST 21 01/06/2017   NA 138 01/06/2017   K 3.7 01/06/2017   CL 103 01/06/2017   CREATININE 0.99 01/06/2017   BUN 12 01/06/2017   CO2 29 01/06/2017   TSH 2.38 01/06/2017   HGBA1C 5.7 11/01/2016  Assessment & Plan:

## 2017-01-09 NOTE — Assessment & Plan Note (Signed)
stable overall by history and exam, recent data reviewed with pt, and pt to continue medical treatment as before,  to f/u any worsening symptoms or concerns Lab Results  Component Value Date   LDLCALC 69 01/06/2017

## 2017-01-09 NOTE — Assessment & Plan Note (Signed)
New onset, pt reports visit to UC with mention of blood in the urine at that time as well; will check ct abd/pelvis r/o stone/malignancy, and refer Urology Dr Felipa Eth in [facility] as her husband also goes there

## 2017-01-09 NOTE — Assessment & Plan Note (Signed)
Ok for ambien qhs prn,  to f/u any worsening symptoms or concerns  

## 2017-01-13 ENCOUNTER — Other Ambulatory Visit: Payer: BLUE CROSS/BLUE SHIELD

## 2017-01-15 ENCOUNTER — Encounter: Payer: Self-pay | Admitting: Internal Medicine

## 2017-01-16 ENCOUNTER — Other Ambulatory Visit: Payer: BLUE CROSS/BLUE SHIELD

## 2017-01-16 ENCOUNTER — Ambulatory Visit
Admission: RE | Admit: 2017-01-16 | Discharge: 2017-01-16 | Disposition: A | Discharge status: Home / Self Care | Payer: BLUE CROSS/BLUE SHIELD | Source: Ambulatory Visit | Attending: Internal Medicine | Admitting: Internal Medicine

## 2017-01-16 DIAGNOSIS — R3129 Other microscopic hematuria: Secondary | ICD-10-CM

## 2017-01-16 DIAGNOSIS — R3121 Asymptomatic microscopic hematuria: Secondary | ICD-10-CM | POA: Diagnosis not present

## 2017-01-16 MED ORDER — IOPAMIDOL (ISOVUE-300) INJECTION 61%
125.0000 mL | Freq: Once | INTRAVENOUS | Status: AC | PRN
  Administered 2017-01-16: 125 mL via INTRAVENOUS

## 2017-01-23 DIAGNOSIS — M47816 Spondylosis without myelopathy or radiculopathy, lumbar region: Secondary | ICD-10-CM | POA: Diagnosis not present

## 2017-01-23 DIAGNOSIS — M5416 Radiculopathy, lumbar region: Secondary | ICD-10-CM | POA: Diagnosis not present

## 2017-01-23 DIAGNOSIS — G894 Chronic pain syndrome: Secondary | ICD-10-CM | POA: Diagnosis not present

## 2017-01-23 DIAGNOSIS — M5136 Other intervertebral disc degeneration, lumbar region: Secondary | ICD-10-CM | POA: Diagnosis not present

## 2017-02-16 DIAGNOSIS — Z6837 Body mass index (BMI) 37.0-37.9, adult: Secondary | ICD-10-CM | POA: Diagnosis not present

## 2017-02-16 DIAGNOSIS — R3129 Other microscopic hematuria: Secondary | ICD-10-CM | POA: Diagnosis not present

## 2017-02-19 ENCOUNTER — Other Ambulatory Visit: Payer: Self-pay | Admitting: Internal Medicine

## 2017-02-19 NOTE — Telephone Encounter (Signed)
Done hardcopy to Shir[place]  

## 2017-02-19 NOTE — Telephone Encounter (Signed)
faxed

## 2017-03-03 DIAGNOSIS — R3129 Other microscopic hematuria: Secondary | ICD-10-CM | POA: Diagnosis not present

## 2017-03-24 DIAGNOSIS — M5136 Other intervertebral disc degeneration, lumbar region: Secondary | ICD-10-CM | POA: Diagnosis not present

## 2017-03-24 DIAGNOSIS — M5416 Radiculopathy, lumbar region: Secondary | ICD-10-CM | POA: Diagnosis not present

## 2017-03-24 DIAGNOSIS — G894 Chronic pain syndrome: Secondary | ICD-10-CM | POA: Diagnosis not present

## 2017-03-24 DIAGNOSIS — M47816 Spondylosis without myelopathy or radiculopathy, lumbar region: Secondary | ICD-10-CM | POA: Diagnosis not present

## 2017-03-26 ENCOUNTER — Other Ambulatory Visit: Payer: Self-pay | Admitting: Internal Medicine

## 2017-04-21 DIAGNOSIS — G894 Chronic pain syndrome: Secondary | ICD-10-CM | POA: Diagnosis not present

## 2017-04-21 DIAGNOSIS — M5416 Radiculopathy, lumbar region: Secondary | ICD-10-CM | POA: Diagnosis not present

## 2017-04-21 DIAGNOSIS — M47816 Spondylosis without myelopathy or radiculopathy, lumbar region: Secondary | ICD-10-CM | POA: Diagnosis not present

## 2017-04-21 DIAGNOSIS — M5136 Other intervertebral disc degeneration, lumbar region: Secondary | ICD-10-CM | POA: Diagnosis not present

## 2017-05-19 DIAGNOSIS — G894 Chronic pain syndrome: Secondary | ICD-10-CM | POA: Diagnosis not present

## 2017-05-19 DIAGNOSIS — Z79891 Long term (current) use of opiate analgesic: Secondary | ICD-10-CM | POA: Diagnosis not present

## 2017-05-19 DIAGNOSIS — M5136 Other intervertebral disc degeneration, lumbar region: Secondary | ICD-10-CM | POA: Diagnosis not present

## 2017-05-19 DIAGNOSIS — M47816 Spondylosis without myelopathy or radiculopathy, lumbar region: Secondary | ICD-10-CM | POA: Diagnosis not present

## 2017-05-19 DIAGNOSIS — M5416 Radiculopathy, lumbar region: Secondary | ICD-10-CM | POA: Diagnosis not present

## 2017-05-20 ENCOUNTER — Other Ambulatory Visit: Payer: Self-pay | Admitting: Internal Medicine

## 2017-05-20 NOTE — Telephone Encounter (Signed)
faxed

## 2017-05-20 NOTE — Telephone Encounter (Signed)
Done hardcopy to Shir[place]  

## 2017-06-18 DIAGNOSIS — G894 Chronic pain syndrome: Secondary | ICD-10-CM | POA: Diagnosis not present

## 2017-06-18 DIAGNOSIS — M47816 Spondylosis without myelopathy or radiculopathy, lumbar region: Secondary | ICD-10-CM | POA: Diagnosis not present

## 2017-06-18 DIAGNOSIS — M5136 Other intervertebral disc degeneration, lumbar region: Secondary | ICD-10-CM | POA: Diagnosis not present

## 2017-06-18 DIAGNOSIS — M5416 Radiculopathy, lumbar region: Secondary | ICD-10-CM | POA: Diagnosis not present

## 2017-06-22 ENCOUNTER — Other Ambulatory Visit: Payer: Self-pay | Admitting: Internal Medicine

## 2017-07-20 DIAGNOSIS — M5136 Other intervertebral disc degeneration, lumbar region: Secondary | ICD-10-CM | POA: Diagnosis not present

## 2017-07-20 DIAGNOSIS — G894 Chronic pain syndrome: Secondary | ICD-10-CM | POA: Diagnosis not present

## 2017-07-20 DIAGNOSIS — M5416 Radiculopathy, lumbar region: Secondary | ICD-10-CM | POA: Diagnosis not present

## 2017-07-20 DIAGNOSIS — M47816 Spondylosis without myelopathy or radiculopathy, lumbar region: Secondary | ICD-10-CM | POA: Diagnosis not present

## 2017-07-30 DIAGNOSIS — Z79899 Other long term (current) drug therapy: Secondary | ICD-10-CM | POA: Diagnosis not present

## 2017-07-30 DIAGNOSIS — K760 Fatty (change of) liver, not elsewhere classified: Secondary | ICD-10-CM | POA: Diagnosis not present

## 2017-07-30 DIAGNOSIS — K219 Gastro-esophageal reflux disease without esophagitis: Secondary | ICD-10-CM | POA: Diagnosis not present

## 2017-07-30 DIAGNOSIS — K2270 Barrett's esophagus without dysplasia: Secondary | ICD-10-CM | POA: Diagnosis not present

## 2017-08-13 DIAGNOSIS — M5416 Radiculopathy, lumbar region: Secondary | ICD-10-CM | POA: Diagnosis not present

## 2017-08-13 DIAGNOSIS — G894 Chronic pain syndrome: Secondary | ICD-10-CM | POA: Diagnosis not present

## 2017-08-13 DIAGNOSIS — M47816 Spondylosis without myelopathy or radiculopathy, lumbar region: Secondary | ICD-10-CM | POA: Diagnosis not present

## 2017-08-13 DIAGNOSIS — M5136 Other intervertebral disc degeneration, lumbar region: Secondary | ICD-10-CM | POA: Diagnosis not present

## 2017-08-14 DIAGNOSIS — K2970 Gastritis, unspecified, without bleeding: Secondary | ICD-10-CM | POA: Diagnosis not present

## 2017-08-14 DIAGNOSIS — K5281 Eosinophilic gastritis or gastroenteritis: Secondary | ICD-10-CM | POA: Diagnosis not present

## 2017-08-14 DIAGNOSIS — Z01818 Encounter for other preprocedural examination: Secondary | ICD-10-CM | POA: Diagnosis not present

## 2017-08-14 DIAGNOSIS — A048 Other specified bacterial intestinal infections: Secondary | ICD-10-CM | POA: Diagnosis not present

## 2017-08-14 DIAGNOSIS — K317 Polyp of stomach and duodenum: Secondary | ICD-10-CM | POA: Diagnosis not present

## 2017-08-14 DIAGNOSIS — K2270 Barrett's esophagus without dysplasia: Secondary | ICD-10-CM | POA: Diagnosis not present

## 2017-08-19 ENCOUNTER — Other Ambulatory Visit: Payer: Self-pay | Admitting: Internal Medicine

## 2017-08-19 NOTE — Telephone Encounter (Signed)
Done erx 

## 2017-09-09 DIAGNOSIS — K2270 Barrett's esophagus without dysplasia: Secondary | ICD-10-CM | POA: Diagnosis not present

## 2017-09-14 DIAGNOSIS — M5416 Radiculopathy, lumbar region: Secondary | ICD-10-CM | POA: Diagnosis not present

## 2017-09-14 DIAGNOSIS — M47816 Spondylosis without myelopathy or radiculopathy, lumbar region: Secondary | ICD-10-CM | POA: Diagnosis not present

## 2017-09-14 DIAGNOSIS — M5136 Other intervertebral disc degeneration, lumbar region: Secondary | ICD-10-CM | POA: Diagnosis not present

## 2017-09-14 DIAGNOSIS — G894 Chronic pain syndrome: Secondary | ICD-10-CM | POA: Diagnosis not present

## 2017-09-16 DIAGNOSIS — Z8742 Personal history of other diseases of the female genital tract: Secondary | ICD-10-CM | POA: Diagnosis not present

## 2017-09-16 DIAGNOSIS — R102 Pelvic and perineal pain: Secondary | ICD-10-CM | POA: Diagnosis not present

## 2017-09-16 DIAGNOSIS — N951 Menopausal and female climacteric states: Secondary | ICD-10-CM | POA: Diagnosis not present

## 2017-09-16 DIAGNOSIS — Z01411 Encounter for gynecological examination (general) (routine) with abnormal findings: Secondary | ICD-10-CM | POA: Diagnosis not present

## 2017-09-23 DIAGNOSIS — R102 Pelvic and perineal pain: Secondary | ICD-10-CM | POA: Diagnosis not present

## 2017-09-23 DIAGNOSIS — Z8742 Personal history of other diseases of the female genital tract: Secondary | ICD-10-CM | POA: Diagnosis not present

## 2017-09-25 DIAGNOSIS — N949 Unspecified condition associated with female genital organs and menstrual cycle: Secondary | ICD-10-CM | POA: Diagnosis not present

## 2017-10-13 DIAGNOSIS — M5416 Radiculopathy, lumbar region: Secondary | ICD-10-CM | POA: Diagnosis not present

## 2017-10-13 DIAGNOSIS — M47816 Spondylosis without myelopathy or radiculopathy, lumbar region: Secondary | ICD-10-CM | POA: Diagnosis not present

## 2017-10-13 DIAGNOSIS — G894 Chronic pain syndrome: Secondary | ICD-10-CM | POA: Diagnosis not present

## 2017-10-13 DIAGNOSIS — M5136 Other intervertebral disc degeneration, lumbar region: Secondary | ICD-10-CM | POA: Diagnosis not present

## 2017-10-22 ENCOUNTER — Other Ambulatory Visit: Payer: Self-pay | Admitting: Obstetrics & Gynecology

## 2017-10-22 DIAGNOSIS — Z1231 Encounter for screening mammogram for malignant neoplasm of breast: Secondary | ICD-10-CM

## 2017-10-23 ENCOUNTER — Encounter (HOSPITAL_COMMUNITY): Payer: Self-pay

## 2017-10-29 DIAGNOSIS — G894 Chronic pain syndrome: Secondary | ICD-10-CM | POA: Diagnosis not present

## 2017-10-29 DIAGNOSIS — N949 Unspecified condition associated with female genital organs and menstrual cycle: Secondary | ICD-10-CM | POA: Diagnosis not present

## 2017-10-29 DIAGNOSIS — E669 Obesity, unspecified: Secondary | ICD-10-CM | POA: Diagnosis not present

## 2017-10-29 DIAGNOSIS — Z90710 Acquired absence of both cervix and uterus: Secondary | ICD-10-CM | POA: Diagnosis not present

## 2017-10-30 ENCOUNTER — Encounter (HOSPITAL_COMMUNITY): Payer: Self-pay

## 2017-10-30 NOTE — Patient Instructions (Addendum)
Your procedure is scheduled on: Wednesday November 11, 2017 at 8:30 am  Enter through the Main Entrance of [facility] at: 7:00 am  Pick up the phone at the desk and dial [phone].  Call this number if you have problems the morning of surgery: [phone].  Remember: Do NOT eat food or drink any liquids after: Midnight on Tuesday March 19  Take these medicines the morning of surgery with a SIP OF WATER: DEXILANT, XANAX  STOP ALL HERBAL PRODUCTS AND SUPPLEMENTS 1 WEEK PRIOR TO SURGERY  DO NOT SMOKE DAY OF SURGERY  Do NOT wear jewelry (body piercing), metal hair clips/bobby pins, make-up, or nail polish. Do NOT wear lotions, powders, or perfumes.  You may wear deoderant. Do NOT shave for 48 hours prior to surgery. Do NOT bring valuables to the hospital. Contacts, dentures, or bridgework may not be worn into surgery.  Have a responsible adult drive you home and stay with you for 24 hours after your procedure

## 2017-11-02 ENCOUNTER — Encounter (HOSPITAL_COMMUNITY): Payer: Self-pay

## 2017-11-02 ENCOUNTER — Encounter (HOSPITAL_COMMUNITY)
Admission: RE | Admit: 2017-11-02 | Discharge: 2017-11-02 | Disposition: A | Discharge status: Home / Self Care | Payer: BLUE CROSS/BLUE SHIELD | Source: Ambulatory Visit | Attending: Obstetrics & Gynecology | Admitting: Obstetrics & Gynecology

## 2017-11-02 ENCOUNTER — Other Ambulatory Visit: Payer: Self-pay

## 2017-11-02 DIAGNOSIS — Z01812 Encounter for preprocedural laboratory examination: Secondary | ICD-10-CM | POA: Diagnosis not present

## 2017-11-02 HISTORY — DX: Anxiety disorder, unspecified: F41.9

## 2017-11-02 HISTORY — DX: Depression, unspecified: F32.A

## 2017-11-02 HISTORY — DX: Pneumonia, unspecified organism: J18.9

## 2017-11-02 HISTORY — DX: Acquired absence of both cervix and uterus: Z90.710

## 2017-11-02 HISTORY — DX: Mononeuropathy, unspecified: G58.9

## 2017-11-02 HISTORY — DX: Major depressive disorder, single episode, unspecified: F32.9

## 2017-11-02 HISTORY — DX: Respiratory tuberculosis unspecified: A15.9

## 2017-11-02 LAB — CBC
HCT: 46.6 % — ABNORMAL HIGH (ref 36.0–46.0)
Hemoglobin: 16 g/dL — ABNORMAL HIGH (ref 12.0–15.0)
MCH: 30.5 pg (ref 26.0–34.0)
MCHC: 34.3 g/dL (ref 30.0–36.0)
MCV: 88.8 fL (ref 78.0–100.0)
PLATELETS: 286 10*3/uL (ref 150–400)
RBC: 5.25 MIL/uL — AB (ref 3.87–5.11)
RDW: 12.9 % (ref 11.5–15.5)
WBC: 8.6 10*3/uL (ref 4.0–10.5)

## 2017-11-02 LAB — COMPREHENSIVE METABOLIC PANEL WITH GFR
ALT: 23 U/L (ref 14–54)
AST: 21 U/L (ref 15–41)
Albumin: 4.3 g/dL (ref 3.5–5.0)
Alkaline Phosphatase: 58 U/L (ref 38–126)
Anion gap: 8 (ref 5–15)
BUN: 10 mg/dL (ref 6–20)
CO2: 25 mmol/L (ref 22–32)
Calcium: 9.3 mg/dL (ref 8.9–10.3)
Chloride: 103 mmol/L (ref 101–111)
Creatinine, Ser: 0.83 mg/dL (ref 0.44–1.00)
GFR calc Af Amer: >60 mL/min
GFR calc non Af Amer: >60 mL/min
Glucose, Bld: 93 mg/dL (ref 65–99)
Potassium: 4 mmol/L (ref 3.5–5.1)
Sodium: 136 mmol/L (ref 135–145)
Total Bilirubin: 0.9 mg/dL (ref 0.3–1.2)
Total Protein: 7.4 g/dL (ref 6.5–8.1)

## 2017-11-02 LAB — TYPE AND SCREEN
ABO/RH(D): O POS
Antibody Screen: NEGATIVE

## 2017-11-02 LAB — ABO/RH: ABO/RH(D): O POS

## 2017-11-03 NOTE — H&P (Signed)
52yo postmenopausal posthyst female who presents for laparoscopic bilateral salpingo-oophorectomy due to adnexal mass.  In review, she has noted intermittent right sided pain. Usually occurs about every 3 mos- describes the pain as an ache. Not sure what causes the pain, no aggravating symptoms. Pain usually resolves on its own within the day. Today she notes the same intermittent pain- no acute change in symptoms sincer her prior visit.  Shes has been followed by [place] (09/23/2017): Right adnexa with elongated cystic structure measring 4.8x2.2cm- enlarged from prior scan- questionalbe tube vs paraovarian cyst. Normal left ovary.        ROS:  CONSTITUTIONAL:  no Appetite changes. no Chills. no Fatigue. no Fever.  CARDIOLOGY:  no Chest pain.  RESPIRATORY:  no Shortness of breath. no Cough.  UROLOGY:  no Dysuria. no Urinary frequency. no Urinary incontinence. no Urinary urgency.  GASTROENTEROLOGY:  Abdominal pain yes. no Change in bowel habits. no Change in bowel movements.  FEMALE REPRODUCTIVE:  no Abnormal vaginal discharge. no Breast lumps or discharge. no Breast pain. Hot flashes yes, occasional. no Sexual problems. no Vaginal irritation. no Vaginal itching.  NEUROLOGY:  no Dizziness. no Headache.  PSYCHOLOGY:  no Anxiety. no Depression.  SKIN:  no Rash. no Hives.  HEMATOLOGY/LYMPH:  no Anemia. Using Blood Thinners no.  h/o numbness/tingling due to pinched nerve- pt seen by pain clinic.       Medical History: Esophageal reflux, Irritable bowel syndrome, Chronic bilateral back pain.         Gyn History:  Sexual activity currently sexually active.  Periods : hysterectomy.  Denies H/O LMP.  Birth control BTL.  Last mammogram date 08/2016.        OB History:  Pregnancy # 1 live birth, vaginal delivery.  Pregnancy # 2 live birth, vaginal delivery.  Pregnancy # 3 live birth, vaginal delivery.        Surgical History: BTL , Gallbladder , Right leg , total hysterectomy .         Hospitalization/Major Diagnostic Procedure: see surgeries , None 10/2017.        Family History: Father: unknown. Mother: alive, diagnosed with Diabetes, Hypertension, Ovarian cancer.        Social History:  General:  Tobacco use  cigarettes: Never smoked Tobacco history last updated 10/29/2017 no EXPOSURE TO PASSIVE SMOKE.  no Alcohol.  no Recreational drug use.        Medications: Taking Hydrocodone-Acetaminophen 10-325 MG Tablet Orally , Taking Pravastatin 10 mg Tablets 4 tablets orally , Taking Xanax(ALPRAZolam) 1 MG Tablet 1 tablet Orally Twice a day, Taking Dexilant(Dexlansoprazole) 60 MG Capsule Delayed Release 1 capsule Orally Once a day, Not-Taking Cymbalta(DULoxetine HCl) 60 MG Capsule Delayed Release Particles 1 capsule Orally Once a day, Not-Taking Baby Aspirin(Aspirin) 81mg  one tablet orallly daily, Discontinued Flexeril(Cyclobenzaprine HCl) 5mg  tablet 1 orally once daily, Medication List reviewed and reconciled with the patient       Allergies: Tetracycline HCl: rash, Penicillin: shortness of breath, Morphine Sulfate ER Beads.       Objective:     Vitals: Wt 219.7, Wt change -.3 lb, Ht 65, BMI 36.56, Pulse sitting 79, BP sitting 124/80.       Examination:  General Examination:  GENERAL APPEARANCE well nourished, well developed.  SKIN: warm & dry, no rashes.  NECK: supple, normal appearance, thyroid not enlarged.  LUNGS: clear to auscultation bilaterally, no wheezes, rhonchi, rales.  HEART: no murmurs, regular rate and rhythm.  ABDOMEN: no masses palpated, soft and not tender,  no rebound, no guarding.  EXTREMITIES: no edema present, no calf tenderness bilaterally.  LYMPH NODES: no inguinal adenopathy, no axillary adenopathy.  PSYCH: oriented x 3, appropriate mood and affect.     CBC Latest Ref Rng & Units 11/02/2017 01/09/2017 01/06/2017  WBC 4.0 - 10.5 K/uL 8.6 8.9 9.2  Hemoglobin 12.0 - 15.0 g/dL 16.0(H) 15.7(H) 15.4(H)  Hematocrit 36.0 - 46.0 % 46.6(H)  46.5(H) 45.0  Platelets 150 - 400 K/uL 286 298.0 280.0    A/P: 52yo postmenopausal posthyst female who presents for laparoscopic bilateral salpingo-oophorectomy due to adnexal mass.  -NPO -LR @ 125cc/hr -antibiotics not indicated -SCDs to OR -Risk/benefits reviewed including but not limited to risk of bleeding, infection and injury to surrounding organs.  Questions and concerns were addressed and pt wishes to proceed  Janyth Pupa, DO [phone] (cell) [phone] (office)

## 2017-11-10 ENCOUNTER — Ambulatory Visit
Admission: RE | Admit: 2017-11-10 | Discharge: 2017-11-10 | Disposition: A | Discharge status: Home / Self Care | Payer: BLUE CROSS/BLUE SHIELD | Source: Ambulatory Visit | Attending: Obstetrics & Gynecology | Admitting: Obstetrics & Gynecology

## 2017-11-10 DIAGNOSIS — Z1231 Encounter for screening mammogram for malignant neoplasm of breast: Secondary | ICD-10-CM

## 2017-11-10 DIAGNOSIS — M47816 Spondylosis without myelopathy or radiculopathy, lumbar region: Secondary | ICD-10-CM | POA: Diagnosis not present

## 2017-11-10 DIAGNOSIS — G894 Chronic pain syndrome: Secondary | ICD-10-CM | POA: Diagnosis not present

## 2017-11-10 DIAGNOSIS — M5416 Radiculopathy, lumbar region: Secondary | ICD-10-CM | POA: Diagnosis not present

## 2017-11-10 DIAGNOSIS — M5136 Other intervertebral disc degeneration, lumbar region: Secondary | ICD-10-CM | POA: Diagnosis not present

## 2017-11-10 NOTE — Anesthesia Preprocedure Evaluation (Signed)
Anesthesia Evaluation  Patient identified by MRN, date of birth, ID band Patient awake    Reviewed: Allergy & Precautions, NPO status , Patient's Chart, lab work & pertinent test results  Airway Mallampati: II  TM Distance: >3 FB Neck ROM: Full    Dental no notable dental hx.    Pulmonary neg pulmonary ROS,    Pulmonary exam normal breath sounds clear to auscultation       Cardiovascular negative cardio ROS Normal cardiovascular exam Rhythm:Regular Rate:Normal     Neuro/Psych negative neurological ROS  negative psych ROS   GI/Hepatic Neg liver ROS, GERD  Medicated,  Endo/Other  negative endocrine ROS  Renal/GU negative Renal ROS  negative genitourinary   Musculoskeletal  (+) Arthritis , Osteoarthritis,    Abdominal   Peds negative pediatric ROS (+)  Hematology negative hematology ROS (+)   Anesthesia Other Findings   Reproductive/Obstetrics negative OB ROS                            Anesthesia Physical Anesthesia Plan  ASA: III  Anesthesia Plan: General   Post-op Pain Management:    Induction: Intravenous  PONV Risk Score and Plan: 3 and Ondansetron, Treatment may vary due to age or medical condition, Dexamethasone and Scopolamine patch - Pre-op  Airway Management Planned: Oral ETT  Additional Equipment:   Intra-op Plan:   Post-operative Plan: Extubation in OR  Informed Consent: I have reviewed the patients History and Physical, chart, labs and discussed the procedure including the risks, benefits and alternatives for the proposed anesthesia with the patient or authorized representative who has indicated his/her understanding and acceptance.     Plan Discussed with: Surgeon, Anesthesiologist and CRNA  Anesthesia Plan Comments: (  )        Anesthesia Quick Evaluation

## 2017-11-11 ENCOUNTER — Ambulatory Visit (HOSPITAL_COMMUNITY): Payer: BLUE CROSS/BLUE SHIELD | Admitting: Anesthesiology

## 2017-11-11 ENCOUNTER — Encounter (HOSPITAL_COMMUNITY): Payer: Self-pay

## 2017-11-11 ENCOUNTER — Encounter (HOSPITAL_COMMUNITY)
Admission: AD | Disposition: A | Discharge status: Home / Self Care | Payer: Self-pay | Source: Ambulatory Visit | Attending: Obstetrics & Gynecology

## 2017-11-11 ENCOUNTER — Ambulatory Visit (HOSPITAL_COMMUNITY)
Admission: AD | Admit: 2017-11-11 | Discharge: 2017-11-11 | Disposition: A | Discharge status: Home / Self Care | Payer: BLUE CROSS/BLUE SHIELD | Source: Ambulatory Visit | Attending: Obstetrics & Gynecology | Admitting: Obstetrics & Gynecology

## 2017-11-11 ENCOUNTER — Other Ambulatory Visit: Payer: Self-pay

## 2017-11-11 DIAGNOSIS — N83292 Other ovarian cyst, left side: Secondary | ICD-10-CM | POA: Insufficient documentation

## 2017-11-11 DIAGNOSIS — N838 Other noninflammatory disorders of ovary, fallopian tube and broad ligament: Secondary | ICD-10-CM | POA: Diagnosis not present

## 2017-11-11 DIAGNOSIS — N7011 Chronic salpingitis: Secondary | ICD-10-CM | POA: Insufficient documentation

## 2017-11-11 DIAGNOSIS — F419 Anxiety disorder, unspecified: Secondary | ICD-10-CM | POA: Diagnosis not present

## 2017-11-11 DIAGNOSIS — Z880 Allergy status to penicillin: Secondary | ICD-10-CM | POA: Insufficient documentation

## 2017-11-11 DIAGNOSIS — K219 Gastro-esophageal reflux disease without esophagitis: Secondary | ICD-10-CM | POA: Insufficient documentation

## 2017-11-11 DIAGNOSIS — N949 Unspecified condition associated with female genital organs and menstrual cycle: Secondary | ICD-10-CM | POA: Diagnosis not present

## 2017-11-11 DIAGNOSIS — Z79899 Other long term (current) drug therapy: Secondary | ICD-10-CM | POA: Insufficient documentation

## 2017-11-11 DIAGNOSIS — R1909 Other intra-abdominal and pelvic swelling, mass and lump: Secondary | ICD-10-CM | POA: Diagnosis not present

## 2017-11-11 DIAGNOSIS — N83291 Other ovarian cyst, right side: Secondary | ICD-10-CM | POA: Diagnosis not present

## 2017-11-11 DIAGNOSIS — E785 Hyperlipidemia, unspecified: Secondary | ICD-10-CM | POA: Diagnosis not present

## 2017-11-11 DIAGNOSIS — K589 Irritable bowel syndrome without diarrhea: Secondary | ICD-10-CM | POA: Diagnosis not present

## 2017-11-11 HISTORY — PX: LAPAROSCOPIC BILATERAL SALPINGO OOPHERECTOMY: SHX5890

## 2017-11-11 HISTORY — PX: CYSTOSCOPY: SHX5120

## 2017-11-11 SURGERY — SALPINGO-OOPHORECTOMY, BILATERAL, LAPAROSCOPIC
Anesthesia: General | Laterality: Bilateral

## 2017-11-11 MED ORDER — BUPIVACAINE HCL (PF) 0.25 % IJ SOLN
INTRAMUSCULAR | Status: AC
  Filled 2017-11-11: qty 30

## 2017-11-11 MED ORDER — ONDANSETRON HCL 4 MG/2ML IJ SOLN
INTRAMUSCULAR | Status: DC | PRN
  Administered 2017-11-11: 4 mg via INTRAVENOUS

## 2017-11-11 MED ORDER — FENTANYL CITRATE (PF) 100 MCG/2ML IJ SOLN
25.0000 ug | INTRAMUSCULAR | Status: DC | PRN
  Administered 2017-11-11 (×2): 50 ug via INTRAVENOUS

## 2017-11-11 MED ORDER — ONDANSETRON HCL 4 MG/2ML IJ SOLN
INTRAMUSCULAR | Status: AC
  Filled 2017-11-11: qty 2

## 2017-11-11 MED ORDER — ACETAMINOPHEN 325 MG PO TABS: 325.0000 mg | ORAL_TABLET | ORAL | Status: DC | PRN

## 2017-11-11 MED ORDER — SCOPOLAMINE 1 MG/3DAYS TD PT72
1.0000 | MEDICATED_PATCH | Freq: Once | TRANSDERMAL | Status: DC
  Administered 2017-11-11: 1.5 mg via TRANSDERMAL

## 2017-11-11 MED ORDER — OXYCODONE HCL 5 MG/5ML PO SOLN: 5.0000 mg | Freq: Once | ORAL | Status: AC | PRN

## 2017-11-11 MED ORDER — ROCURONIUM BROMIDE 100 MG/10ML IV SOLN
INTRAVENOUS | Status: DC | PRN
  Administered 2017-11-11: 30 mg via INTRAVENOUS
  Administered 2017-11-11: 20 mg via INTRAVENOUS

## 2017-11-11 MED ORDER — PROPOFOL 10 MG/ML IV BOLUS
INTRAVENOUS | Status: DC | PRN
  Administered 2017-11-11: 150 mg via INTRAVENOUS
  Administered 2017-11-11: 50 mg via INTRAVENOUS

## 2017-11-11 MED ORDER — FENTANYL CITRATE (PF) 100 MCG/2ML IJ SOLN
INTRAMUSCULAR | Status: AC
  Filled 2017-11-11: qty 2

## 2017-11-11 MED ORDER — SODIUM CHLORIDE 0.9 % IR SOLN
Status: DC | PRN
  Administered 2017-11-11: 3000 mL

## 2017-11-11 MED ORDER — SODIUM CHLORIDE 0.9 % IJ SOLN
INTRAMUSCULAR | Status: DC | PRN
  Administered 2017-11-11: 10 mL

## 2017-11-11 MED ORDER — MIDAZOLAM HCL 2 MG/2ML IJ SOLN
INTRAMUSCULAR | Status: AC
  Filled 2017-11-11: qty 2

## 2017-11-11 MED ORDER — LIDOCAINE HCL (CARDIAC) 20 MG/ML IV SOLN
INTRAVENOUS | Status: DC | PRN
  Administered 2017-11-11: 80 mg via INTRAVENOUS

## 2017-11-11 MED ORDER — ACETAMINOPHEN 160 MG/5ML PO SOLN: 325.0000 mg | ORAL | Status: DC | PRN

## 2017-11-11 MED ORDER — LACTATED RINGERS IV SOLN: INTRAVENOUS | Status: DC

## 2017-11-11 MED ORDER — DEXMEDETOMIDINE HCL IN NACL 200 MCG/50ML IV SOLN
INTRAVENOUS | Status: AC
Start: 2017-11-11 — End: 2017-11-11
  Filled 2017-11-11: qty 50

## 2017-11-11 MED ORDER — ROCURONIUM BROMIDE 100 MG/10ML IV SOLN
INTRAVENOUS | Status: AC
  Filled 2017-11-11: qty 1

## 2017-11-11 MED ORDER — MIDAZOLAM HCL 2 MG/2ML IJ SOLN
INTRAMUSCULAR | Status: DC | PRN
  Administered 2017-11-11: 2 mg via INTRAVENOUS

## 2017-11-11 MED ORDER — OXYCODONE HCL 5 MG PO TABS
5.0000 mg | ORAL_TABLET | Freq: Once | ORAL | Status: AC | PRN
  Administered 2017-11-11: 5 mg via ORAL

## 2017-11-11 MED ORDER — PROPOFOL 10 MG/ML IV BOLUS
INTRAVENOUS | Status: AC
  Filled 2017-11-11: qty 40

## 2017-11-11 MED ORDER — MEPERIDINE HCL 25 MG/ML IJ SOLN: 6.2500 mg | INTRAMUSCULAR | Status: DC | PRN

## 2017-11-11 MED ORDER — FENTANYL CITRATE (PF) 250 MCG/5ML IJ SOLN
INTRAMUSCULAR | Status: AC
  Filled 2017-11-11: qty 5

## 2017-11-11 MED ORDER — DEXAMETHASONE SODIUM PHOSPHATE 10 MG/ML IJ SOLN
INTRAMUSCULAR | Status: AC
  Filled 2017-11-11: qty 1

## 2017-11-11 MED ORDER — KETOROLAC TROMETHAMINE 30 MG/ML IJ SOLN
INTRAMUSCULAR | Status: DC | PRN
  Administered 2017-11-11: 30 mg via INTRAVENOUS

## 2017-11-11 MED ORDER — KETOROLAC TROMETHAMINE 30 MG/ML IJ SOLN: 30.0000 mg | Freq: Once | INTRAMUSCULAR | Status: DC | PRN

## 2017-11-11 MED ORDER — DEXAMETHASONE SODIUM PHOSPHATE 10 MG/ML IJ SOLN
INTRAMUSCULAR | Status: DC | PRN
  Administered 2017-11-11: 10 mg via INTRAVENOUS

## 2017-11-11 MED ORDER — DEXMEDETOMIDINE HCL IN NACL 200 MCG/50ML IV SOLN
INTRAVENOUS | Status: DC | PRN
  Administered 2017-11-11: 4 ug via INTRAVENOUS

## 2017-11-11 MED ORDER — STERILE WATER FOR IRRIGATION IR SOLN
Status: DC | PRN
  Administered 2017-11-11: 1000 mL via INTRAVESICAL

## 2017-11-11 MED ORDER — BUPIVACAINE HCL (PF) 0.25 % IJ SOLN
INTRAMUSCULAR | Status: DC | PRN
  Administered 2017-11-11: 30 mL

## 2017-11-11 MED ORDER — SODIUM CHLORIDE 0.9 % IJ SOLN
INTRAMUSCULAR | Status: AC
  Filled 2017-11-11: qty 20

## 2017-11-11 MED ORDER — METHYLENE BLUE 0.5 % INJ SOLN
INTRAVENOUS | Status: DC | PRN
  Administered 2017-11-11: 5 mL via INTRAVENOUS

## 2017-11-11 MED ORDER — METHYLENE BLUE 0.5 % INJ SOLN
INTRAVENOUS | Status: AC
  Filled 2017-11-11: qty 10

## 2017-11-11 MED ORDER — KETOROLAC TROMETHAMINE 30 MG/ML IJ SOLN
INTRAMUSCULAR | Status: AC
  Filled 2017-11-11: qty 1

## 2017-11-11 MED ORDER — 0.9 % SODIUM CHLORIDE (POUR BTL) OPTIME
TOPICAL | Status: DC | PRN
  Administered 2017-11-11: 1000 mL

## 2017-11-11 MED ORDER — SUGAMMADEX SODIUM 200 MG/2ML IV SOLN
INTRAVENOUS | Status: AC
  Filled 2017-11-11: qty 2

## 2017-11-11 MED ORDER — DIPHENHYDRAMINE HCL 50 MG/ML IJ SOLN
INTRAMUSCULAR | Status: DC | PRN
  Administered 2017-11-11: 25 mg via INTRAVENOUS

## 2017-11-11 MED ORDER — SUGAMMADEX SODIUM 200 MG/2ML IV SOLN
INTRAVENOUS | Status: DC | PRN
  Administered 2017-11-11: 200 mg via INTRAVENOUS

## 2017-11-11 MED ORDER — FENTANYL CITRATE (PF) 100 MCG/2ML IJ SOLN
INTRAMUSCULAR | Status: DC
Start: 2017-11-11 — End: 2017-11-11
  Filled 2017-11-11: qty 2

## 2017-11-11 MED ORDER — LACTATED RINGERS IV SOLN
INTRAVENOUS | Status: DC
  Administered 2017-11-11 (×2): via INTRAVENOUS

## 2017-11-11 MED ORDER — FENTANYL CITRATE (PF) 100 MCG/2ML IJ SOLN
INTRAMUSCULAR | Status: DC | PRN
  Administered 2017-11-11 (×2): 100 ug via INTRAVENOUS
  Administered 2017-11-11: 50 ug via INTRAVENOUS

## 2017-11-11 MED ORDER — GLYCOPYRROLATE 0.2 MG/ML IJ SOLN
INTRAMUSCULAR | Status: DC | PRN
  Administered 2017-11-11: 0.1 mg via INTRAVENOUS

## 2017-11-11 MED ORDER — OXYCODONE HCL 5 MG PO TABS
ORAL_TABLET | ORAL | Status: AC
  Filled 2017-11-11: qty 1

## 2017-11-11 MED ORDER — SCOPOLAMINE 1 MG/3DAYS TD PT72
MEDICATED_PATCH | TRANSDERMAL | Status: AC
  Administered 2017-11-11: 1.5 mg via TRANSDERMAL
  Filled 2017-11-11: qty 1

## 2017-11-11 MED ORDER — ONDANSETRON HCL 4 MG/2ML IJ SOLN: 4.0000 mg | Freq: Once | INTRAMUSCULAR | Status: DC | PRN

## 2017-11-11 MED ORDER — LIDOCAINE HCL (CARDIAC) 20 MG/ML IV SOLN
INTRAVENOUS | Status: AC
  Filled 2017-11-11: qty 5

## 2017-11-11 SURGICAL SUPPLY — 35 items
APPLICATOR ARISTA FLEXITIP XL (MISCELLANEOUS) ×3 IMPLANT
DERMABOND ADVANCED (GAUZE/BANDAGES/DRESSINGS) ×1
DERMABOND ADVANCED .7 DNX12 (GAUZE/BANDAGES/DRESSINGS) ×2 IMPLANT
DRSG OPSITE POSTOP 3X4 (GAUZE/BANDAGES/DRESSINGS) ×3 IMPLANT
DURAPREP 26ML APPLICATOR (WOUND CARE) ×3 IMPLANT
GLOVE BIOGEL PI IND STRL 6.5 (GLOVE) ×4 IMPLANT
GLOVE BIOGEL PI IND STRL 7.0 (GLOVE) ×4 IMPLANT
GLOVE BIOGEL PI INDICATOR 6.5 (GLOVE) ×2
GLOVE BIOGEL PI INDICATOR 7.0 (GLOVE) ×2
GLOVE ECLIPSE 6.5 STRL STRAW (GLOVE) ×3 IMPLANT
GOWN STRL REUS W/TWL LRG LVL3 (GOWN DISPOSABLE) ×6 IMPLANT
HEMOSTAT ARISTA ABSORB 3G PWDR (MISCELLANEOUS) ×3 IMPLANT
NEEDLE INSUFFLATION 120MM (ENDOMECHANICALS) ×3 IMPLANT
PACK LAPAROSCOPY BASIN (CUSTOM PROCEDURE TRAY) ×3 IMPLANT
PACK TRENDGUARD 450 HYBRID PRO (MISCELLANEOUS) ×2 IMPLANT
PACK TRENDGUARD 600 HYBRD PROC (MISCELLANEOUS) IMPLANT
POUCH SPECIMEN RETRIEVAL 10MM (ENDOMECHANICALS) ×3 IMPLANT
PROTECTOR NERVE ULNAR (MISCELLANEOUS) ×6 IMPLANT
SET CYSTO W/LG BORE CLAMP LF (SET/KITS/TRAYS/PACK) ×3 IMPLANT
SET IRRIG TUBING LAPAROSCOPIC (IRRIGATION / IRRIGATOR) IMPLANT
SHEARS HARMONIC ACE PLUS 36CM (ENDOMECHANICALS) ×3 IMPLANT
SLEEVE XCEL OPT CAN 5 100 (ENDOMECHANICALS) ×3 IMPLANT
SOLUTION ELECTROLUBE (MISCELLANEOUS) ×3 IMPLANT
SUT MON AB 4-0 PS1 27 (SUTURE) ×3 IMPLANT
SUT VICRYL 0 UR6 27IN ABS (SUTURE) ×3 IMPLANT
SYR 50ML LL SCALE MARK (SYRINGE) ×3 IMPLANT
SYR CONTROL 10ML LL (SYRINGE) ×3 IMPLANT
SYSTEM CARTER THOMASON II (TROCAR) ×3 IMPLANT
TOWEL OR 17X24 6PK STRL BLUE (TOWEL DISPOSABLE) ×6 IMPLANT
TRAY FOLEY CATH SILVER 14FR (SET/KITS/TRAYS/PACK) ×3 IMPLANT
TRENDGUARD 450 HYBRID PRO PACK (MISCELLANEOUS) ×3
TRENDGUARD 600 HYBRID PROC PK (MISCELLANEOUS)
TROCAR XCEL NON-BLD 11X100MML (ENDOMECHANICALS) ×3 IMPLANT
TROCAR XCEL NON-BLD 5MMX100MML (ENDOMECHANICALS) ×3 IMPLANT
WARMER LAPAROSCOPE (MISCELLANEOUS) ×3 IMPLANT

## 2017-11-11 NOTE — Interval H&P Note (Signed)
History and Physical Interval Note:  11/11/2017 7:09 AM  Pamela Gibson  has presented today for surgery, with the diagnosis of Adnexal Mass  The various methods of treatment have been discussed with the patient and family. After consideration of risks, benefits and other options for treatment, the patient has consented to  Procedure(s): LAPAROSCOPIC BILATERAL SALPINGO OOPHORECTOMY (Bilateral) as a surgical intervention .  The patient's history has been reviewed, patient examined, no change in status, stable for surgery.  I have reviewed the patient's chart and labs.  Questions were answered to the patient's satisfaction.     Annalee Genta

## 2017-11-11 NOTE — Op Note (Signed)
PREOPERATIVE DIAGNOSIS:  Adnexal mass POSTOPERATIVE DIAGNOSIS: same PROCEDURE PERFORMED: Laparoscopic bilateral salpingo-oophorectomy and cystoscopy SURGEON: Dr. Janyth Pupa ASSISTANT:Dr. Christophe Louis ANESTHESIA: General endotracheal.  ESTIMATED BLOOD LOSS: 20cc.  URINE OUTPUT: 300cc of clear yellow urine at the end of the procedure.  IV FLUIDS: 1500cc of crystalloid.  SPECIMEN(S): bilateral fallopian tubes and ovaries COMPLICATIONS: None.  CONDITION: Stable.  FINDINGS: No ascites or peritoneal studding was appreciated.  Liver, gallbladder and bowel appeared grossly normal. Uterus surgically absent.  Left ovary with hemorrhagic cyst.  Right ovary appears normal with dilated/enlarged right fallopian tube.  Mild adhesions noted of right ovary to pelvic side wall.   Informed consent was obtained from the patient prior to taking her to the operating room where anesthesia was found to be adequate. She was placed in dorsal lithotomy position and examined under anesthesia. She was prepped and draped in normal sterile fashion. The bladder was catheterized with a foley under sterile technique.  Attention was turned to the patient's abdomen where a 10 mm infraumbilical skin incision was made with the scalpel. The veress needle was carefully introduced into the peritoneal cavity while tenting the abdominal wall. Intraperitoneal placement was confirmed by use of a saline-drop test.  The gas was connected and confirmed intrabdominal placement by a low initial pressure of 27mmHg. The abdomen was then insuflated with CO2 gas. The trocar and sleeve were then advanced without difficulty into the abdomen under direct visualization. Intraabdominal placement was confirmed by the laparoscope and surveillance of the abdomen was performed. Grossly normal appearing abdomen as mentioned in findings above.  Two additional 84mm skin incision were made in the left and right lower quadrants with placement of the trocar under  direct visualization.   Attempts were made to visualize the ureters; however, they were not seen.  The right ovary was then placed on tension and using the Harmonic, the right infundipulopelvic ligament was clamped and ligated. Serial ligations were used for full dissection of the right ovary. Attention was then turned to the left adnexa.  In a similar fashion, the harmonic was used for serial ligation.  The endocatch bag was then placed through the 11 mm port and the specimens were removed in their entirety. Re-examination of the abdominal cavity confirmed hemostasis. Irrigation was performed and again hemostasis was noted.  Arista was placed.  A Carter-Thompson was inserted in the umbilicus for closure of the port. The fascia was closed using 0-vicryl under direct visualization. The instruments were then removed from the patients abdomen with air allowed to fully escape. The umbilcal port site was closed with monocryl. The lower quadrant ports were closed with dermabond.  The foley catheter was removed.  Pt was given methylene blue.  Cystoscopy was performed, both urethral orifices were visualized with good efflux bilaterally.  The bladder was drained.  The patient tolerated the procedure well with all sponge, lap, and needle counts correct. The patient was taken to recovery in stable condition.   Dr. Landry Mellow was present to assist due to the complexity of the case and since residents are not available to our service.  Janyth Pupa, DO [phone] (cell) [phone] (office)

## 2017-11-11 NOTE — Transfer of Care (Signed)
Immediate Anesthesia Transfer of Care Note  Patient: Pamela Gibson  Procedure(s) Performed: LAPAROSCOPIC BILATERAL SALPINGO OOPHORECTOMY (Bilateral ) CYSTOSCOPY  Patient Location: PACU  Anesthesia Type:General  Level of Consciousness: awake, alert  and oriented  Airway & Oxygen Therapy: Patient Spontanous Breathing and Patient connected to nasal cannula oxygen  Post-op Assessment: Report given to RN and Post -op Vital signs reviewed and stable  Post vital signs: Reviewed and stable  Last Vitals:  Vitals:   11/11/17 1025 11/11/17 1026  BP:    Pulse: (!) 122 (!) 121  Resp: 16 18  Temp:    SpO2:      Last Pain:  Vitals:   11/11/17 0659  TempSrc: Oral  PainSc: 3       Patients Stated Pain Goal: 4 (02/63/78 5885)  Complications: No apparent anesthesia complications

## 2017-11-11 NOTE — Discharge Instructions (Addendum)
DISCHARGE INSTRUCTIONS: Laparoscopy ° °The following instructions have been prepared to help you care for yourself upon your return home today. ° °Wound care: °• Do not get the incision wet for the first 24 hours. The incision should be kept clean and dry. °• The Band-Aids or dressings may be removed the day after surgery. °• Should the incision become sore, red, and swollen after the first week, check with your doctor. ° °Personal hygiene: °• Shower the day after your procedure. ° °Activity and limitations: °• Do NOT drive or operate any equipment today. °• Do NOT lift anything more than 15 pounds for 2-3 weeks after surgery. °• Do NOT rest in bed all day. °• Walking is encouraged. Walk each day, starting slowly with 5-minute walks 3 or 4 times a day. Slowly increase the length of your walks. °• Walk up and down stairs slowly. °• Do NOT do strenuous activities, such as golfing, playing tennis, bowling, running, biking, weight lifting, gardening, mowing, or vacuuming for 2-4 weeks. Ask your doctor when it is okay to start. ° °Diet: Eat a light meal as desired this evening. You may resume your usual diet tomorrow. ° °Return to work: This is dependent on the type of work you do. For the most part you can return to a desk job within a week of surgery. If you are more active at work, please discuss this with your doctor. ° °What to expect after your surgery: You may have a slight burning sensation when you urinate on the first day. You may have a very small amount of blood in the urine. Expect to have a small amount of vaginal discharge/light bleeding for 1-2 weeks. It is not unusual to have abdominal soreness and bruising for up to 2 weeks. You may be tired and need more rest for about 1 week. You may experience shoulder pain for 24-72 hours. Lying flat in bed may relieve it. ° °Call your doctor for any of the following: °• Develop a fever of 100.4 or greater °• Inability to urinate 6 hours after discharge from  hospital °• Severe pain not relieved by pain medications °• Persistent of heavy bleeding at incision site °• Redness or swelling around incision site after a week °• Increasing nausea or vomiting ° °Patient Signature________________________________________ °Nurse Signature_________________________________________DISCHARGE INSTRUCTIONS: Laparoscopy ° °The following instructions have been prepared to help you care for yourself upon your return home today. ° °Wound care: °• Do not get the incision wet for the first 24 hours. The incision should be kept clean and dry. °• The Band-Aids or dressings may be removed the day after surgery. °• Should the incision become sore, red, and swollen after the first week, check with your doctor. ° °Personal hygiene: °• Shower the day after your procedure. ° °Activity and limitations: °• Do NOT drive or operate any equipment today. °• Do NOT lift anything more than 15 pounds for 2-3 weeks after surgery. °• Do NOT rest in bed all day. °• Walking is encouraged. Walk each day, starting slowly with 5-minute walks 3 or 4 times a day. Slowly increase the length of your walks. °• Walk up and down stairs slowly. °• Do NOT do strenuous activities, such as golfing, playing tennis, bowling, running, biking, weight lifting, gardening, mowing, or vacuuming for 2-4 weeks. Ask your doctor when it is okay to start. ° °Diet: Eat a light meal as desired this evening. You may resume your usual diet tomorrow. ° °Return to work: This is dependent on   the type of work you do. For the most part you can return to a desk job within a week of surgery. If you are more active at work, please discuss this with your doctor.  What to expect after your surgery: You may have a slight burning sensation when you urinate on the first day. You may have a very small amount of blood in the urine. Expect to have a small amount of vaginal discharge/light bleeding for 1-2 weeks. It is not unusual to have abdominal soreness  and bruising for up to 2 weeks. You may be tired and need more rest for about 1 week. You may experience shoulder pain for 24-72 hours. Lying flat in bed may relieve it.  Call your doctor for any of the following:  Develop a fever of 100.4 or greater  Inability to urinate 6 hours after discharge from hospital  Severe pain not relieved by pain medications  Persistent of heavy bleeding at incision site  Redness or swelling around incision site after a week  Increasing nausea or vomiting  Patient Signature________________________________________ Nurse Signature_________________________________________HOME INSTRUCTIONS  Please note any unusual or excessive bleeding, pain, swelling. Mild dizziness or drowsiness are normal for about 24 hours after surgery.   Shower when comfortable  Restrictions: No driving for 24 hours or while taking pain medications.  Activity:  No heavy lifting (> 10 lbs), nothing in vagina (no tampons, douching, or intercourse) x 2 weeks; no tub baths for 2 weeks Vaginal spotting is expected but if your bleeding is heavy, period like,  please call the office   Incision: the bandaids will fall off when they are ready to; you may clean your incision with mild soap and water but do not rub or scrub the incision site.  You may experience slight bloody drainage from your incision periodically.  This is normal.  If you experience a large amount of drainage or the incision opens, please call your physician who will likely direct you to the emergency department.  Diet:  You may return to your regular diet.  Do not eat large meals.  Eat small frequent meals throughout the day.  Continue to drink a good amount of water at least 6-8 glasses of water per day, hydration is very important for the healing process.  Pain Management: as instructed per pain clinic  Always take prescription pain medication with food, it may cause constipation, increase fluids and fiber and you may want  to take an over-the-counter stool softener like Colace as needed up to 2x a day.    Alcohol -- Avoid for 24 hours and while taking pain medications.  Nausea: Take sips of ginger ale or soda  Fever -- Call physician if temperature over 101 degrees  Follow up:  If you do not already have a follow up appointment scheduled, please call the office at [phone].  If you experience fever (a temperature greater than 100.4), pain unrelieved by pain medication, shortness of breath, swelling of a single leg, or any other symptoms which are concerning to you please the office immediately.

## 2017-11-11 NOTE — Anesthesia Procedure Notes (Signed)
Procedure Name: Intubation Date/Time: 11/11/2017 8:45 AM Performed by: Genevie Ann, CRNA Pre-anesthesia Checklist: Patient identified, Emergency Drugs available, Suction available, Patient being monitored and Timeout performed Patient Re-evaluated:Patient Re-evaluated prior to induction Oxygen Delivery Method: Circle system utilized Preoxygenation: Pre-oxygenation with 100% oxygen Induction Type: IV induction Ventilation: Mask ventilation without difficulty Laryngoscope Size: Mac, 3, 4 and Glidescope Grade View: Grade III Tube type: Oral Tube size: 7.0 mm Number of attempts: 3 Airway Equipment and Method: Video-laryngoscopy Placement Confirmation: ETT inserted through vocal cords under direct vision and positive ETCO2 Secured at: 21 cm Dental Injury: Teeth and Oropharynx as per pre-operative assessment  Difficulty Due To: Difficulty was unanticipated and Difficult Airway- due to anterior larynx

## 2017-11-11 NOTE — Anesthesia Postprocedure Evaluation (Signed)
Anesthesia Post Note  Patient: Pamela Gibson  Procedure(s) Performed: LAPAROSCOPIC BILATERAL SALPINGO OOPHORECTOMY (Bilateral ) CYSTOSCOPY     Patient location during evaluation: PACU Anesthesia Type: General Level of consciousness: awake and alert Pain management: pain level controlled Vital Signs Assessment: post-procedure vital signs reviewed and stable Respiratory status: spontaneous breathing, nonlabored ventilation, respiratory function stable and patient connected to nasal cannula oxygen Cardiovascular status: blood pressure returned to baseline and stable Postop Assessment: no apparent nausea or vomiting Anesthetic complications: no    Last Vitals:  Vitals:   11/11/17 1100 11/11/17 1115  BP: 119/64 115/68  Pulse: 89 87  Resp: 18 18  Temp:    SpO2: 92% 92%    Last Pain:  Vitals:   11/11/17 1100  TempSrc:   PainSc: 4    Pain Goal: Patients Stated Pain Goal: 4 (11/11/17 1024)               Edwyn Inclan

## 2017-11-12 ENCOUNTER — Encounter (HOSPITAL_COMMUNITY): Payer: Self-pay | Admitting: Obstetrics & Gynecology

## 2017-11-17 ENCOUNTER — Other Ambulatory Visit: Payer: Self-pay | Admitting: Internal Medicine

## 2017-11-17 NOTE — Telephone Encounter (Signed)
Last filled 10/19/2017 90#

## 2017-11-17 NOTE — Telephone Encounter (Signed)
Done erx 

## 2017-12-08 ENCOUNTER — Other Ambulatory Visit: Payer: Self-pay | Admitting: Internal Medicine

## 2017-12-09 DIAGNOSIS — M5136 Other intervertebral disc degeneration, lumbar region: Secondary | ICD-10-CM | POA: Diagnosis not present

## 2017-12-09 DIAGNOSIS — M5416 Radiculopathy, lumbar region: Secondary | ICD-10-CM | POA: Diagnosis not present

## 2017-12-09 DIAGNOSIS — M47816 Spondylosis without myelopathy or radiculopathy, lumbar region: Secondary | ICD-10-CM | POA: Diagnosis not present

## 2017-12-09 DIAGNOSIS — G894 Chronic pain syndrome: Secondary | ICD-10-CM | POA: Diagnosis not present

## 2017-12-18 DIAGNOSIS — M545 Low back pain: Secondary | ICD-10-CM | POA: Diagnosis not present

## 2018-01-06 DIAGNOSIS — G894 Chronic pain syndrome: Secondary | ICD-10-CM | POA: Diagnosis not present

## 2018-01-06 DIAGNOSIS — M5136 Other intervertebral disc degeneration, lumbar region: Secondary | ICD-10-CM | POA: Diagnosis not present

## 2018-01-06 DIAGNOSIS — M47816 Spondylosis without myelopathy or radiculopathy, lumbar region: Secondary | ICD-10-CM | POA: Diagnosis not present

## 2018-01-06 DIAGNOSIS — M5416 Radiculopathy, lumbar region: Secondary | ICD-10-CM | POA: Diagnosis not present

## 2018-01-26 ENCOUNTER — Telehealth: Payer: Self-pay

## 2018-01-26 ENCOUNTER — Other Ambulatory Visit (INDEPENDENT_AMBULATORY_CARE_PROVIDER_SITE_OTHER): Payer: BLUE CROSS/BLUE SHIELD

## 2018-01-26 DIAGNOSIS — Z0000 Encounter for general adult medical examination without abnormal findings: Secondary | ICD-10-CM

## 2018-01-26 LAB — URINALYSIS, ROUTINE W REFLEX MICROSCOPIC
Bilirubin Urine: NEGATIVE
Ketones, ur: NEGATIVE
Leukocytes, UA: NEGATIVE
Nitrite: NEGATIVE
RBC / HPF: NONE SEEN
Specific Gravity, Urine: <=1.005 — AB (ref 1.000–1.030)
Total Protein, Urine: NEGATIVE
Urine Glucose: NEGATIVE
Urobilinogen, UA: 0.2 (ref 0.0–1.0)
pH: 6 (ref 5.0–8.0)

## 2018-01-26 LAB — HEPATIC FUNCTION PANEL
ALT: 22 U/L (ref 0–35)
AST: 19 U/L (ref 0–37)
Albumin: 4.4 g/dL (ref 3.5–5.2)
Alkaline Phosphatase: 52 U/L (ref 39–117)
Bilirubin, Direct: 0.2 mg/dL (ref 0.0–0.3)
Total Bilirubin: 0.7 mg/dL (ref 0.2–1.2)
Total Protein: 7.1 g/dL (ref 6.0–8.3)

## 2018-01-26 LAB — CBC WITH DIFFERENTIAL/PLATELET
Basophils Absolute: 0.1 10*3/uL (ref 0.0–0.1)
Basophils Relative: 1.1 % (ref 0.0–3.0)
Eosinophils Absolute: 0.2 10*3/uL (ref 0.0–0.7)
Eosinophils Relative: 2.8 % (ref 0.0–5.0)
HCT: 47.1 % — ABNORMAL HIGH (ref 36.0–46.0)
Hemoglobin: 16.1 g/dL — ABNORMAL HIGH (ref 12.0–15.0)
Lymphocytes Relative: 40.1 % (ref 12.0–46.0)
Lymphs Abs: 2.8 10*3/uL (ref 0.7–4.0)
MCHC: 34.1 g/dL (ref 30.0–36.0)
MCV: 88 fl (ref 78.0–100.0)
Monocytes Absolute: 0.6 10*3/uL (ref 0.1–1.0)
Monocytes Relative: 8.8 % (ref 3.0–12.0)
Neutro Abs: 3.3 10*3/uL (ref 1.4–7.7)
Neutrophils Relative %: 47.2 % (ref 43.0–77.0)
Platelets: 260 10*3/uL (ref 150.0–400.0)
RBC: 5.35 Mil/uL — ABNORMAL HIGH (ref 3.87–5.11)
RDW: 12.6 % (ref 11.5–15.5)
WBC: 7.1 10*3/uL (ref 4.0–10.5)

## 2018-01-26 LAB — LIPID PANEL
Cholesterol: 161 mg/dL (ref 0–200)
HDL: 45.2 mg/dL (ref >39.00)
LDL CALC: 91 mg/dL (ref 0–99)
NONHDL: 115.32
TRIGLYCERIDES: 123 mg/dL (ref 0.0–149.0)
Total CHOL/HDL Ratio: 4
VLDL: 24.6 mg/dL (ref 0.0–40.0)

## 2018-01-26 LAB — BASIC METABOLIC PANEL
BUN: 15 mg/dL (ref 6–23)
CALCIUM: 9.8 mg/dL (ref 8.4–10.5)
CHLORIDE: 103 meq/L (ref 96–112)
CO2: 28 meq/L (ref 19–32)
CREATININE: 0.94 mg/dL (ref 0.40–1.20)
GFR: 66.29 mL/min (ref >60.00)
Glucose, Bld: 97 mg/dL (ref 70–99)
Potassium: 3.9 mEq/L (ref 3.5–5.1)
Sodium: 140 mEq/L (ref 135–145)

## 2018-01-26 LAB — TSH: TSH: 2.95 u[IU]/mL (ref 0.35–4.50)

## 2018-01-26 NOTE — Telephone Encounter (Signed)
CPE orders needed to be entered

## 2018-02-04 DIAGNOSIS — M5416 Radiculopathy, lumbar region: Secondary | ICD-10-CM | POA: Diagnosis not present

## 2018-02-04 DIAGNOSIS — G894 Chronic pain syndrome: Secondary | ICD-10-CM | POA: Diagnosis not present

## 2018-02-04 DIAGNOSIS — M47816 Spondylosis without myelopathy or radiculopathy, lumbar region: Secondary | ICD-10-CM | POA: Diagnosis not present

## 2018-02-04 DIAGNOSIS — Z79891 Long term (current) use of opiate analgesic: Secondary | ICD-10-CM | POA: Diagnosis not present

## 2018-02-04 DIAGNOSIS — M5136 Other intervertebral disc degeneration, lumbar region: Secondary | ICD-10-CM | POA: Diagnosis not present

## 2018-02-05 ENCOUNTER — Encounter: Payer: Self-pay | Admitting: Internal Medicine

## 2018-02-05 ENCOUNTER — Ambulatory Visit (INDEPENDENT_AMBULATORY_CARE_PROVIDER_SITE_OTHER): Payer: BLUE CROSS/BLUE SHIELD | Admitting: Internal Medicine

## 2018-02-05 VITALS — BP 116/82 | HR 67 | Temp 98.0°F | Ht 65.0 in | Wt 215.0 lb

## 2018-02-05 DIAGNOSIS — R232 Flushing: Secondary | ICD-10-CM

## 2018-02-05 DIAGNOSIS — Z0000 Encounter for general adult medical examination without abnormal findings: Secondary | ICD-10-CM

## 2018-02-05 DIAGNOSIS — R3129 Other microscopic hematuria: Secondary | ICD-10-CM

## 2018-02-05 DIAGNOSIS — D751 Secondary polycythemia: Secondary | ICD-10-CM | POA: Diagnosis not present

## 2018-02-05 MED ORDER — SERTRALINE HCL 50 MG PO TABS: 50.0000 mg | ORAL_TABLET | Freq: Every day | ORAL | 3 refills | Status: DC

## 2018-02-05 NOTE — Patient Instructions (Signed)
Please take all new medication as prescribed - the zoloft 50 mg per day  Please continue all other medications as before, and refills have been done if requested.  Please have the pharmacy call with any other refills you may need.  Please continue your efforts at being more active, low cholesterol diet, and weight control.  You are otherwise up to date with prevention measures today.  Please keep your appointments with your specialists as you may have planned  Please return in 6 months, or sooner if needed, with Lab testing done 3-5 days before

## 2018-02-05 NOTE — Assessment & Plan Note (Signed)
Mild new onset s/p GYN surgury - for zoloft 50 qd

## 2018-02-05 NOTE — Assessment & Plan Note (Signed)
Mild chronic stable, cont to monitor

## 2018-02-05 NOTE — Assessment & Plan Note (Signed)
S/p urology eval neg for malignancy, cont to monitor

## 2018-02-05 NOTE — Progress Notes (Signed)
Subjective:    Patient ID: Pamela Gibson, female    DOB: 02/03/1966, 52 y.o.   MRN: [id number]  HPI  Here for wellness and f/u;  Overall doing ok;  Pt denies Chest pain, worsening SOB, DOE, wheezing, orthopnea, PND, worsening LE edema, palpitations, dizziness or syncope.  Pt denies neurological change such as new headache, facial or extremity weakness.  Pt denies polydipsia, polyuria, or low sugar symptoms. Pt states overall good compliance with treatment and medications, good tolerability, and has been trying to follow appropriate diet.  Pt denies worsening depressive symptoms, suicidal ideation or panic. No fever, night sweats, wt loss, loss of appetite, or other constitutional symptoms.  Pt states good ability with ADL's, has low fall risk, home safety reviewed and adequate, no other significant changes in hearing or vision, and only occasionally active with exercise.  Has had increased depression without SI or HI; Tried cymbalta, effexor and 2 others that all seemed to cause HA about 2pm daily per pain management. S/p TAH BSO with now worsening hot flashes , willing to try zoloft.  No other new complaints or interval hx Past Medical History:  Diagnosis Date  . Anxiety   . BACK PAIN 07/23/2007  . CHEST PAIN 09/04/2010  . Cyst of right ovary 07/03/2015  . DEGENERATIVE JOINT DISEASE, LUMBAR SPINE 07/23/2007  . Depression   . Gastric polyp    x3  . GERD 07/22/2007  . H/O total hysterectomy   . HYPERLIPIDEMIA 07/22/2007  . IBS 07/23/2007  . MITRAL VALVE PROLAPSE 07/22/2007  . Pinched nerve    LEFT SIDE, DISC 3,4,5  . Pneumonia    YEARS AGO  . SINUSITIS- ACUTE-NOS 09/09/2007  . TACHYCARDIA 08/20/2010  . Tuberculosis     MOTHER HAD YEARS AGO  . Unspecified Vaginitis and Vulvovaginitis 12/17/2007   Past Surgical History:  Procedure Laterality Date  . ABDOMINAL HYSTERECTOMY    . CHOLECYSTECTOMY    . CYSTOSCOPY  11/11/2017   Procedure: CYSTOSCOPY;  Surgeon: Janyth Pupa, DO;  Location:  [facility];  Service: Gynecology;;  . LAPAROSCOPIC BILATERAL SALPINGO OOPHERECTOMY Bilateral 11/11/2017   Procedure: LAPAROSCOPIC BILATERAL SALPINGO OOPHORECTOMY;  Surgeon: Janyth Pupa, DO;  Location: [facility];  Service: Gynecology;  Laterality: Bilateral;  . RLE tib/fib fx/surgury    . TONSILLECTOMY    . TUBAL LIGATION      reports that she has never smoked. She has never used smokeless tobacco. She reports that she does not drink alcohol or use drugs. family history includes Angina in her mother; Colon polyps in her mother; Coronary artery disease in her mother; Diabetes in her other; Heart disease in her mother; Hyperlipidemia in her unknown relative; Hypertension in her other; Stroke in her mother. Allergies  Allergen Reactions  . Atorvastatin     REACTION: more forgetful  . Ibuprofen     INSTRUCTED BY MD NOT TO TAKE DUE TO GI UPSET  . Penicillins     turns blue, childhood allergy Has patient had a PCN reaction causing immediate rash, facial/tongue/throat swelling, SOB or lightheadedness with hypotension: Yes Has patient had a PCN reaction causing severe rash involving mucus membranes or skin necrosis: No Has patient had a PCN reaction that required hospitalization: Unknown  Has patient had a PCN reaction occurring within the last 10 years: No If all of the above answers are "NO", then may proceed with Cephalosporin use.   . Simvastatin     REACTION: more forgetful  . Tetracycline     REACTION: itch  Current Outpatient Medications on File Prior to Visit  Medication Sig Dispense Refill  . ALPRAZolam (XANAX) 1 MG tablet TAKE 1 TABLET BY MOUTH THREE TIMES A DAY AS NEEDED FOR ANXIETY 90 tablet 2  . aspirin 81 MG EC tablet Take 81 mg by mouth daily.      . calcium carbonate (TUMS - DOSED IN MG ELEMENTAL CALCIUM) 500 MG chewable tablet Chew 1-2 tablets by mouth daily as needed for indigestion or heartburn.    . nystatin (MYCOSTATIN/NYSTOP) powder Apply topically 3 (three) times daily.  (Patient taking differently: Apply 1 g topically daily as needed (rash). ) 15 g 4  . oxyCODONE-acetaminophen (PERCOCET) 10-325 MG tablet Take 1 tablet by mouth every 4 (four) hours as needed for pain.    . polyethylene glycol (MIRALAX / GLYCOLAX) packet Take 17 g by mouth daily as needed.    . pravastatin (PRAVACHOL) 40 MG tablet TAKE 1 TABLET BY MOUTH DAILY 90 tablet 0  . zolpidem (AMBIEN) 10 MG tablet Take 1 tablet (10 mg total) by mouth at bedtime as needed for sleep. 90 tablet 1   No current facility-administered medications on file prior to visit.    Review of Systems Constitutional: Negative for other unusual diaphoresis, sweats, appetite or weight changes HENT: Negative for other worsening hearing loss, ear pain, facial swelling, mouth sores or neck stiffness.   Eyes: Negative for other worsening pain, redness or other visual disturbance.  Respiratory: Negative for other stridor or swelling Cardiovascular: Negative for other palpitations or other chest pain  Gastrointestinal: Negative for worsening diarrhea or loose stools, blood in stool, distention or other pain Genitourinary: Negative for hematuria, flank pain or other change in urine volume.  Musculoskeletal: Negative for myalgias or other joint swelling.  Skin: Negative for other color change, or other wound or worsening drainage.  Neurological: Negative for other syncope or numbness. Hematological: Negative for other adenopathy or swelling Psychiatric/Behavioral: Negative for hallucinations, other worsening agitation, SI, self-injury, or new decreased concentration All other system neg per pt    Objective:   Physical Exam BP 116/82   Pulse 67   Temp 98 F (36.7 C) (Oral)   Ht 5\' 5"  (1.651 m)   Wt 215 lb (97.5 kg)   SpO2 98%   BMI 35.78 kg/m  VS noted, not ill appearing Constitutional: Pt appears in NAD HENT: Head: NCAT.  Right Ear: External ear normal.  Left Ear: External ear normal.  Eyes: . Pupils are equal,  round, and reactive to light. Conjunctivae and EOM are normal Nose: without d/c or deformity Neck: Neck supple. Gross normal ROM Cardiovascular: Normal rate and regular rhythm.   Pulmonary/Chest: Effort normal and breath sounds without rales or wheezing.  Abd:  Soft, NT, ND, + BS, no organomegaly Neurological: Pt is alert. At baseline orientation, motor grossly intact Skin: Skin is warm. No rashes, other new lesions, no LE edema Psychiatric: Pt behavior is normal without agitation , mild dysphoric No other exam findings Lab Results  Component Value Date   WBC 7.1 01/26/2018   HGB 16.1 (H) 01/26/2018   HCT 47.1 (H) 01/26/2018   PLT 260.0 01/26/2018   GLUCOSE 97 01/26/2018   CHOL 161 01/26/2018   TRIG 123.0 01/26/2018   HDL 45.20 01/26/2018   LDLDIRECT 128.8 07/18/2008   LDLCALC 91 01/26/2018   ALT 22 01/26/2018   AST 19 01/26/2018   NA 140 01/26/2018   K 3.9 01/26/2018   CL 103 01/26/2018   CREATININE 0.94 01/26/2018  BUN 15 01/26/2018   CO2 28 01/26/2018   TSH 2.95 01/26/2018   HGBA1C 5.7 11/01/2016      Assessment & Plan:

## 2018-02-05 NOTE — Assessment & Plan Note (Signed)

## 2018-02-06 ENCOUNTER — Other Ambulatory Visit: Payer: Self-pay | Admitting: Internal Medicine

## 2018-02-08 NOTE — Telephone Encounter (Signed)
Done erx 

## 2018-02-08 NOTE — Telephone Encounter (Signed)
01/09/2017 90# 

## 2018-02-15 ENCOUNTER — Other Ambulatory Visit: Payer: Self-pay | Admitting: Internal Medicine

## 2018-02-16 NOTE — Telephone Encounter (Signed)
01/15/2018 90# 

## 2018-02-16 NOTE — Telephone Encounter (Signed)
Done erx 

## 2018-03-03 ENCOUNTER — Other Ambulatory Visit: Payer: Self-pay | Admitting: Physical Medicine and Rehabilitation

## 2018-03-03 DIAGNOSIS — M5416 Radiculopathy, lumbar region: Secondary | ICD-10-CM

## 2018-03-06 ENCOUNTER — Ambulatory Visit
Admission: RE | Admit: 2018-03-06 | Discharge: 2018-03-06 | Disposition: A | Discharge status: Home / Self Care | Payer: BLUE CROSS/BLUE SHIELD | Source: Ambulatory Visit | Attending: Physical Medicine and Rehabilitation | Admitting: Physical Medicine and Rehabilitation

## 2018-03-06 DIAGNOSIS — M5416 Radiculopathy, lumbar region: Secondary | ICD-10-CM

## 2018-03-06 DIAGNOSIS — M48061 Spinal stenosis, lumbar region without neurogenic claudication: Secondary | ICD-10-CM | POA: Diagnosis not present

## 2018-03-09 DIAGNOSIS — M5136 Other intervertebral disc degeneration, lumbar region: Secondary | ICD-10-CM | POA: Diagnosis not present

## 2018-03-09 DIAGNOSIS — G894 Chronic pain syndrome: Secondary | ICD-10-CM | POA: Diagnosis not present

## 2018-03-09 DIAGNOSIS — M47816 Spondylosis without myelopathy or radiculopathy, lumbar region: Secondary | ICD-10-CM | POA: Diagnosis not present

## 2018-03-09 DIAGNOSIS — M5416 Radiculopathy, lumbar region: Secondary | ICD-10-CM | POA: Diagnosis not present

## 2018-03-10 ENCOUNTER — Other Ambulatory Visit: Payer: Self-pay | Admitting: Internal Medicine

## 2018-03-27 ENCOUNTER — Encounter: Payer: Self-pay | Admitting: Urgent Care

## 2018-03-27 ENCOUNTER — Other Ambulatory Visit: Payer: Self-pay

## 2018-03-27 ENCOUNTER — Ambulatory Visit: Payer: BLUE CROSS/BLUE SHIELD | Admitting: Urgent Care

## 2018-03-27 VITALS — BP 121/81 | HR 66 | Temp 97.9°F | Resp 16 | Ht 65.0 in | Wt 214.0 lb

## 2018-03-27 DIAGNOSIS — R8290 Unspecified abnormal findings in urine: Secondary | ICD-10-CM | POA: Diagnosis not present

## 2018-03-27 DIAGNOSIS — R350 Frequency of micturition: Secondary | ICD-10-CM

## 2018-03-27 LAB — POCT URINALYSIS DIP (MANUAL ENTRY)
BILIRUBIN UA: NEGATIVE
Glucose, UA: NEGATIVE mg/dL
Ketones, POC UA: NEGATIVE mg/dL
LEUKOCYTES UA: NEGATIVE
NITRITE UA: NEGATIVE
PH UA: 6.5 (ref 5.0–8.0)
Protein Ur, POC: NEGATIVE mg/dL
Spec Grav, UA: 1.015 (ref 1.010–1.025)
Urobilinogen, UA: 4 E.U./dL — AB

## 2018-03-27 MED ORDER — NITROFURANTOIN MONOHYD MACRO 100 MG PO CAPS: 100.0000 mg | ORAL_CAPSULE | Freq: Two times a day (BID) | ORAL | 0 refills | Status: DC

## 2018-03-27 NOTE — Progress Notes (Signed)
   MRN: [id number] DOB: [birth year]  Subjective:   Pamela Gibson is a 52 y.o. female presenting for 4 day history of dysuria, urinary frequency and cloudy malordorous urine. Drinks ~2 bottles of water daily, drinks coffee and tea. Denies fever, hematuria, urinary urgency, flank pain, abdominal pain and pelvic pain, nausea and vomiting. Denies smoking cigarettes.   Pamela Gibson has a current medication list which includes the following prescription(s): alprazolam, aspirin, calcium carbonate, nystatin, oxycodone-acetaminophen, polyethylene glycol, pravastatin, sertraline, and zolpidem. Patient is allergic to atorvastatin; ibuprofen; penicillins; simvastatin; and tetracycline. Pamela Gibson  has a past medical history of Anxiety, BACK PAIN (07/23/2007), CHEST PAIN (09/04/2010), Cyst of right ovary (07/03/2015), DEGENERATIVE JOINT DISEASE, LUMBAR SPINE (07/23/2007), Depression, Gastric polyp, GERD (07/22/2007), H/O total hysterectomy, HYPERLIPIDEMIA (07/22/2007), IBS (07/23/2007), MITRAL VALVE PROLAPSE (07/22/2007), Pinched nerve, Pneumonia, SINUSITIS- ACUTE-NOS (09/09/2007), TACHYCARDIA (08/20/2010), Tuberculosis, and Unspecified Vaginitis and Vulvovaginitis (12/17/2007). Also  has a past surgical history that includes Abdominal hysterectomy; Tubal ligation; Cholecystectomy; Tonsillectomy; RLE tib/fib fx/surgury; Laparoscopic bilateral salpingo oophorectomy (Bilateral, 11/11/2017); and Cystoscopy (11/11/2017).  Objective:   Vitals: BP 121/81   Pulse 66   Temp 97.9 F (36.6 C)   Resp 16   Ht 5\' 5"  (1.651 m)   Wt 214 lb (97.1 kg)   SpO2 98%   BMI 35.61 kg/m   Physical Exam  Constitutional: She is oriented to person, place, and time. She appears well-developed and well-nourished.  Eyes: Right eye exhibits no discharge. Left eye exhibits no discharge. No scleral icterus.  Cardiovascular: Normal rate, regular rhythm, normal heart sounds and intact distal pulses. Exam reveals no gallop and no friction rub.  No  murmur heard. Pulmonary/Chest: Effort normal and breath sounds normal. No stridor. No respiratory distress. She has no wheezes. She has no rales.  Abdominal: Soft. Bowel sounds are normal. She exhibits no distension and no mass. There is no tenderness. There is no rebound and no guarding.  No CVA tenderness.   Neurological: She is alert and oriented to person, place, and time.  Skin: Skin is warm and dry.  Psychiatric: She has a normal mood and affect.   Results for orders placed or performed in visit on 03/27/18 (from the past 24 hour(s))  POCT urinalysis dipstick     Status: Abnormal   Collection Time: 03/27/18  1:40 PM  Result Value Ref Range   Color, UA yellow yellow   Clarity, UA clear clear   Glucose, UA negative negative mg/dL   Bilirubin, UA negative negative   Ketones, POC UA negative negative mg/dL   Spec Grav, UA 1.015 1.010 - 1.025   Blood, UA moderate (A) negative   pH, UA 6.5 5.0 - 8.0   Protein Ur, POC negative negative mg/dL   Urobilinogen, UA 4.0 (A) 0.2 or 1.0 E.U./dL   Nitrite, UA Negative Negative   Leukocytes, UA Negative Negative   Assessment and Plan :   Frequent urination - Plan: POCT urinalysis dipstick, Urine Culture  Bad odor of urine - Plan: Urine Culture  Urine culture pending, patient was agreeable to modifying her liquids.  She can try to drink more water and cut back on caffeinated drinks and sodas.  In the meantime I will cover her for cystitis with Macrobid. Counseled patient on potential for adverse effects with medications prescribed today, patient verbalized understanding.   Jaynee Eagles, PA-C [facility] [phone] 03/27/2018 2:20 PM

## 2018-03-27 NOTE — Patient Instructions (Addendum)
Urinary Tract Infection, Adult A urinary tract infection (UTI) is an infection of any part of the urinary tract. The urinary tract includes the:  Kidneys.  Ureters.  Bladder.  Urethra.  These organs make, store, and get rid of pee (urine) in the body. Follow these instructions at home:  Take over-the-counter and prescription medicines only as told by your doctor.  If you were prescribed an antibiotic medicine, take it as told by your doctor. Do not stop taking the antibiotic even if you start to feel better.  Avoid the following drinks: ? Alcohol. ? Caffeine. ? Tea. ? Carbonated drinks.  Drink enough fluid to keep your pee clear or pale yellow.  Keep all follow-up visits as told by your doctor. This is important.  Make sure to: ? Empty your bladder often and completely. Do not to hold pee for long periods of time. ? Empty your bladder before and after sex. ? Wipe from front to back after a bowel movement if you are female. Use each tissue one time when you wipe. Contact a doctor if:  You have back pain.  You have a fever.  You feel sick to your stomach (nauseous).  You throw up (vomit).  Your symptoms do not get better after 3 days.  Your symptoms go away and then come back. Get help right away if:  You have very bad back pain.  You have very bad lower belly (abdominal) pain.  You are throwing up and cannot keep down any medicines or water. This information is not intended to replace advice given to you by your health care provider. Make sure you discuss any questions you have with your health care provider. Document Released: 01/28/2008 Document Revised: 01/17/2016 Document Reviewed: 07/02/2015 Elsevier Interactive Patient Education  2018 [employer].     IF you received an x-ray today, you will receive an invoice from [facility]. Please contact [facility] at [phone] with questions or concerns regarding your invoice.   IF you  received labwork today, you will receive an invoice from [facility]. Please contact [facility] at [phone] with questions or concerns regarding your invoice.   Our billing staff will not be able to assist you with questions regarding bills from these companies.  You will be contacted with the lab results as soon as they are available. The fastest way to get your results is to activate your My Chart account. Instructions are located on the last page of this paperwork. If you have not heard from [place] regarding the results in 2 weeks, please contact this office.

## 2018-03-30 LAB — URINE CULTURE

## 2018-04-01 ENCOUNTER — Telehealth: Payer: Self-pay | Admitting: Urgent Care

## 2018-04-01 NOTE — Telephone Encounter (Signed)
Copied from [place] [id number]. Topic: Inquiry >> Apr 01, 2018 11:20 AM Synthia Innocent wrote: Reason for CRM: Requesting lab results

## 2018-04-08 DIAGNOSIS — M47816 Spondylosis without myelopathy or radiculopathy, lumbar region: Secondary | ICD-10-CM | POA: Diagnosis not present

## 2018-04-08 DIAGNOSIS — M5416 Radiculopathy, lumbar region: Secondary | ICD-10-CM | POA: Diagnosis not present

## 2018-04-08 DIAGNOSIS — M5136 Other intervertebral disc degeneration, lumbar region: Secondary | ICD-10-CM | POA: Diagnosis not present

## 2018-04-08 DIAGNOSIS — G894 Chronic pain syndrome: Secondary | ICD-10-CM | POA: Diagnosis not present

## 2018-05-04 ENCOUNTER — Encounter: Payer: Self-pay | Admitting: Internal Medicine

## 2018-05-04 ENCOUNTER — Other Ambulatory Visit (INDEPENDENT_AMBULATORY_CARE_PROVIDER_SITE_OTHER): Payer: BLUE CROSS/BLUE SHIELD

## 2018-05-04 ENCOUNTER — Ambulatory Visit (INDEPENDENT_AMBULATORY_CARE_PROVIDER_SITE_OTHER): Payer: BLUE CROSS/BLUE SHIELD | Admitting: Internal Medicine

## 2018-05-04 ENCOUNTER — Ambulatory Visit
Admission: RE | Admit: 2018-05-04 | Discharge: 2018-05-04 | Disposition: A | Discharge status: Home / Self Care | Payer: BLUE CROSS/BLUE SHIELD | Source: Ambulatory Visit | Attending: Internal Medicine | Admitting: Internal Medicine

## 2018-05-04 VITALS — BP 118/74 | HR 78 | Temp 98.5°F | Ht 65.0 in | Wt 214.0 lb

## 2018-05-04 DIAGNOSIS — R109 Unspecified abdominal pain: Secondary | ICD-10-CM

## 2018-05-04 DIAGNOSIS — F419 Anxiety disorder, unspecified: Secondary | ICD-10-CM | POA: Diagnosis not present

## 2018-05-04 DIAGNOSIS — R300 Dysuria: Secondary | ICD-10-CM

## 2018-05-04 DIAGNOSIS — N1339 Other hydronephrosis: Secondary | ICD-10-CM | POA: Diagnosis not present

## 2018-05-04 DIAGNOSIS — R350 Frequency of micturition: Secondary | ICD-10-CM | POA: Diagnosis not present

## 2018-05-04 LAB — URINALYSIS, ROUTINE W REFLEX MICROSCOPIC
BILIRUBIN URINE: NEGATIVE
KETONES UR: NEGATIVE
NITRITE: POSITIVE — AB
PH: 6.5 (ref 5.0–8.0)
Specific Gravity, Urine: 1.01 (ref 1.000–1.030)
TOTAL PROTEIN, URINE-UPE24: NEGATIVE
UROBILINOGEN UA: 1 (ref 0.0–1.0)
Urine Glucose: NEGATIVE

## 2018-05-04 LAB — CBC WITH DIFFERENTIAL/PLATELET
BASOS ABS: 0.1 10*3/uL (ref 0.0–0.1)
Basophils Relative: 0.7 % (ref 0.0–3.0)
EOS PCT: 1.8 % (ref 0.0–5.0)
Eosinophils Absolute: 0.1 10*3/uL (ref 0.0–0.7)
HCT: 45.7 % (ref 36.0–46.0)
HEMOGLOBIN: 15.7 g/dL — AB (ref 12.0–15.0)
LYMPHS PCT: 40 % (ref 12.0–46.0)
Lymphs Abs: 3 10*3/uL (ref 0.7–4.0)
MCHC: 34.3 g/dL (ref 30.0–36.0)
MCV: 87.4 fl (ref 78.0–100.0)
MONOS PCT: 8.5 % (ref 3.0–12.0)
Monocytes Absolute: 0.6 10*3/uL (ref 0.1–1.0)
NEUTROS PCT: 49 % (ref 43.0–77.0)
Neutro Abs: 3.6 10*3/uL (ref 1.4–7.7)
Platelets: 263 10*3/uL (ref 150.0–400.0)
RBC: 5.23 Mil/uL — AB (ref 3.87–5.11)
RDW: 12.7 % (ref 11.5–15.5)
WBC: 7.4 10*3/uL (ref 4.0–10.5)

## 2018-05-04 LAB — BASIC METABOLIC PANEL
BUN: 11 mg/dL (ref 6–23)
CHLORIDE: 104 meq/L (ref 96–112)
CO2: 30 mEq/L (ref 19–32)
Calcium: 9.5 mg/dL (ref 8.4–10.5)
Creatinine, Ser: 0.94 mg/dL (ref 0.40–1.20)
GFR: 66.22 mL/min (ref >60.00)
Glucose, Bld: 93 mg/dL (ref 70–99)
POTASSIUM: 4.3 meq/L (ref 3.5–5.1)
Sodium: 141 mEq/L (ref 135–145)

## 2018-05-04 MED ORDER — ALPRAZOLAM 1 MG PO TABS: ORAL_TABLET | ORAL | 2 refills | Status: DC

## 2018-05-04 MED ORDER — CIPROFLOXACIN HCL 500 MG PO TABS: 500.0000 mg | ORAL_TABLET | Freq: Two times a day (BID) | ORAL | 0 refills | Status: AC

## 2018-05-04 NOTE — Assessment & Plan Note (Signed)
2018 ct neg for obstruction or stones, but now with right flank pain - for renal u/s r/o obstruction or abscess

## 2018-05-04 NOTE — Assessment & Plan Note (Signed)
Likely recurrent UTI, for urine studies, and empiric cipro,  to f/u any worsening symptoms or concerns

## 2018-05-04 NOTE — Patient Instructions (Addendum)
Please take all new medication as prescribed - the antibiotic  Please continue all other medications as before, and refills have been done if requested  The xanax  Please have the pharmacy call with any other refills you may need.  Please keep your appointments with your specialists as you may have planned  You will be contacted regarding the referral for: Kidney ultrasound (to see [facility] now)  Please go to the LAB in the Basement (turn left off the elevator) for the tests to be done today  You will be contacted by phone if any changes need to be made immediately.  Otherwise, you will receive a letter about your results with an explanation, but please check with MyChart first.  Please remember to sign up for MyChart if you have not done so, as this will be important to you in the future with finding out test results, communicating by private email, and scheduling acute appointments online when needed.

## 2018-05-04 NOTE — Progress Notes (Signed)
Subjective:    Patient ID: Pamela Gibson, female    DOB: 1966/06/02, 52 y.o.   MRN: [id number]  HPI  Here with 3 days onset recurrent symptoms similar to recent UTI tx per UC early aug 2019, with culture proven Ecoli pansensitive, tx with macrobid x 7 days and improved.  Now with recurrent dysuria and nausea and right flank pain, but no high fever, chills and no hematuria or vomiting.  Has hx of microhematuria with Urology Dr Felipa Eth evaluation 2018 with CT abd/pelvis without stones or other, and cystocscopy neg per pt.  Denies incontinence.  Did have GYN surgury mar 2019.  Pt denies chest pain, increased sob or doe, wheezing, orthopnea, PND, increased LE swelling, palpitations, dizziness or syncope.   Pt denies polydipsia, polyuria Past Medical History:  Diagnosis Date  . Anxiety   . BACK PAIN 07/23/2007  . CHEST PAIN 09/04/2010  . Cyst of right ovary 07/03/2015  . DEGENERATIVE JOINT DISEASE, LUMBAR SPINE 07/23/2007  . Depression   . Gastric polyp    x3  . GERD 07/22/2007  . H/O total hysterectomy   . HYPERLIPIDEMIA 07/22/2007  . IBS 07/23/2007  . MITRAL VALVE PROLAPSE 07/22/2007  . Pinched nerve    LEFT SIDE, DISC 3,4,5  . Pneumonia    YEARS AGO  . SINUSITIS- ACUTE-NOS 09/09/2007  . TACHYCARDIA 08/20/2010  . Tuberculosis     MOTHER HAD YEARS AGO  . Unspecified Vaginitis and Vulvovaginitis 12/17/2007   Past Surgical History:  Procedure Laterality Date  . ABDOMINAL HYSTERECTOMY    . CHOLECYSTECTOMY    . CYSTOSCOPY  11/11/2017   Procedure: CYSTOSCOPY;  Surgeon: Janyth Pupa, DO;  Location: [facility];  Service: Gynecology;;  . LAPAROSCOPIC BILATERAL SALPINGO OOPHERECTOMY Bilateral 11/11/2017   Procedure: LAPAROSCOPIC BILATERAL SALPINGO OOPHORECTOMY;  Surgeon: Janyth Pupa, DO;  Location: [facility];  Service: Gynecology;  Laterality: Bilateral;  . RLE tib/fib fx/surgury    . TONSILLECTOMY    . TUBAL LIGATION      reports that she has never smoked. She has never used smokeless  tobacco. She reports that she does not drink alcohol or use drugs. family history includes Angina in her mother; Colon polyps in her mother; Coronary artery disease in her mother; Diabetes in her other; Heart disease in her mother; Hyperlipidemia in her unknown relative; Hypertension in her other; Stroke in her mother. Allergies  Allergen Reactions  . Atorvastatin     REACTION: more forgetful  . Ibuprofen     INSTRUCTED BY MD NOT TO TAKE DUE TO GI UPSET  . Penicillins     turns blue, childhood allergy Has patient had a PCN reaction causing immediate rash, facial/tongue/throat swelling, SOB or lightheadedness with hypotension: Yes Has patient had a PCN reaction causing severe rash involving mucus membranes or skin necrosis: No Has patient had a PCN reaction that required hospitalization: Unknown  Has patient had a PCN reaction occurring within the last 10 years: No If all of the above answers are "NO", then may proceed with Cephalosporin use.   . Simvastatin     REACTION: more forgetful  . Tetracycline     REACTION: itch   Current Outpatient Medications on File Prior to Visit  Medication Sig Dispense Refill  . ALPRAZolam (XANAX) 1 MG tablet TAKE 1 TABLET BY MOUTH THREE TIMES A DAY AS NEEDED FOR ANXIETY 90 tablet 2  . aspirin 81 MG EC tablet Take 81 mg by mouth daily.      . calcium carbonate (TUMS -  DOSED IN MG ELEMENTAL CALCIUM) 500 MG chewable tablet Chew 1-2 tablets by mouth daily as needed for indigestion or heartburn.    Marland Kitchen oxyCODONE-acetaminophen (PERCOCET) 10-325 MG tablet Take 1 tablet by mouth every 4 (four) hours as needed for pain.    . polyethylene glycol (MIRALAX / GLYCOLAX) packet Take 17 g by mouth daily as needed.    . pravastatin (PRAVACHOL) 40 MG tablet TAKE 1 TABLET BY MOUTH DAILY 90 tablet 3   No current facility-administered medications on file prior to visit.    Review of Systems  Constitutional: Negative for other unusual diaphoresis or sweats HENT: Negative for  ear discharge or swelling Eyes: Negative for other worsening visual disturbances Respiratory: Negative for stridor or other swelling  Gastrointestinal: Negative for worsening distension or other blood Genitourinary: Negative for retention or other urinary change Musculoskeletal: Negative for other MSK pain or swelling Skin: Negative for color change or other new lesions Neurological: Negative for worsening tremors and other numbness  Psychiatric/Behavioral: Negative for worsening agitation or other fatigue All other system neg per pt    Objective:   Physical Exam BP 118/74   Pulse 78   Temp 98.5 F (36.9 C) (Oral)   Ht 5\' 5"  (1.651 m)   Wt 214 lb (97.1 kg)   SpO2 97%   BMI 35.61 kg/m  VS noted,  Constitutional: Pt appears in NAD HENT: Head: NCAT.  Right Ear: External ear normal.  Left Ear: External ear normal.  Eyes: . Pupils are equal, round, and reactive to light. Conjunctivae and EOM are normal Nose: without d/c or deformity Neck: Neck supple. Gross normal ROM Cardiovascular: Normal rate and regular rhythm.   Pulmonary/Chest: Effort normal and breath sounds without rales or wheezing.  Abd:  Soft,  ND, + BS, no organomegaly wit mild low mid abd tender, no guarding or rebound, mild right flank tender noted Neurological: Pt is alert. At baseline orientation, motor grossly intact Skin: Skin is warm. No rashes, other new lesions, no LE edema Psychiatric: Pt behavior is normal without agitation , mild nervous, asks for xanax refill No other exam findings  Urine Culture, Routine Final reportAbnormal    Organism ID, Bacteria CommentAbnormal    Comment: Escherichia coli, identified by an automated biochemical system.  Greater than 100,000 colony forming units per mL  Cefazolin <=4 ug/mL  Cefazolin with an MIC <=16 predicts susceptibility to the oral agents  cefaclor, cefdinir, cefpodoxime, cefprozil, cefuroxime, cephalexin,  and loracarbef when used for therapy of uncomplicated  urinary tract  infections due to E. coli, Klebsiella pneumoniae, and Proteus  mirabilis.   Antimicrobial Susceptibility Comment   Comment:   ** S = Susceptible; I = Intermediate; R = Resistant **           P = Positive; N = Negative        MICS are expressed in micrograms per mL   Antibiotic         RSLT#1  RSLT#2  RSLT#3  RSLT#4  Amoxicillin/Clavulanic Acid  S  Ampicillin           S  Cefepime            S  Ceftriaxone          S  Cefuroxime           S  Ciprofloxacin         S  Ertapenem           S  Gentamicin  S  Imipenem            S  Levofloxacin          S  Meropenem           S  Nitrofurantoin         S  Piperacillin/Tazobactam    S  Tetracycline          S  Tobramycin           S  Trimethoprim/Sulfa       S   Resulting Agency [facility]    Narrative  Performed by: [facility]  Performed at: [address] [place], [place] [id number] Lab Director: Rush Farmer MD, Phone: [phone]    Specimen Collected: 03/27/18 13:57 Last Resulted: 03/30/18 17:35       CT ABDOMEN AND PELVIS WITHOUT AND WITH CONTRAST  TECHNIQUE: Multidetector CT imaging of the abdomen and pelvis was performed following the standard protocol before and following the bolus administration of intravenous contrast.  CONTRAST:  116mL ISOVUE-300 IOPAMIDOL (ISOVUE-300) INJECTION 61%  COMPARISON:  06/30/2014  FINDINGS: Lower chest: The lung bases appear clear.  Hepatobiliary: No focal liver abnormality is seen. Status post cholecystectomy. No biliary dilatation.  Pancreas: Unremarkable. No pancreatic ductal dilatation or surrounding inflammatory changes.  Spleen: Normal in size without focal abnormality.  Adrenals/Urinary Tract: The adrenal glands are normal. No renal calculi  identified no ureteral or bladder calculi noted. No kidney mass or hydronephrosis identified. No filling defects identified within the urinary bladder.  Stomach/Bowel: The stomach appears normal. The small bowel loops have a normal course and caliber. Normal appearance of the colon. No abnormal bowel wall thickening or inflammation.  Vascular/Lymphatic: Aortic atherosclerosis noted. No aneurysm. No enlarged retroperitoneal or mesenteric adenopathy. No enlarged pelvic or inguinal lymph nodes.  Reproductive: Status post hysterectomy. No adnexal masses.  Other: No abdominal wall hernia or abnormality. No abdominopelvic ascites.  Musculoskeletal: No acute or significant osseous findings.  IMPRESSION: 1. No findings to explain patient's hematuria. No kidney stones identified. 2.  Aortic Atherosclerosis (ICD10-I70.0).   Electronically Signed   By: Kerby Moors M.D.   On: 01/16/2017 16:42    Assessment & Plan:

## 2018-05-04 NOTE — Assessment & Plan Note (Signed)
For xanax refill,  to f/u any worsening symptoms or concerns 

## 2018-05-06 DIAGNOSIS — M47816 Spondylosis without myelopathy or radiculopathy, lumbar region: Secondary | ICD-10-CM | POA: Diagnosis not present

## 2018-05-06 DIAGNOSIS — G894 Chronic pain syndrome: Secondary | ICD-10-CM | POA: Diagnosis not present

## 2018-05-06 DIAGNOSIS — M5136 Other intervertebral disc degeneration, lumbar region: Secondary | ICD-10-CM | POA: Diagnosis not present

## 2018-05-06 DIAGNOSIS — M5416 Radiculopathy, lumbar region: Secondary | ICD-10-CM | POA: Diagnosis not present

## 2018-05-06 LAB — URINE CULTURE
MICRO NUMBER:: 91081620
SPECIMEN QUALITY:: ADEQUATE

## 2018-06-03 DIAGNOSIS — M5416 Radiculopathy, lumbar region: Secondary | ICD-10-CM | POA: Diagnosis not present

## 2018-06-03 DIAGNOSIS — G894 Chronic pain syndrome: Secondary | ICD-10-CM | POA: Diagnosis not present

## 2018-06-03 DIAGNOSIS — M47816 Spondylosis without myelopathy or radiculopathy, lumbar region: Secondary | ICD-10-CM | POA: Diagnosis not present

## 2018-06-03 DIAGNOSIS — M5136 Other intervertebral disc degeneration, lumbar region: Secondary | ICD-10-CM | POA: Diagnosis not present

## 2018-06-09 ENCOUNTER — Other Ambulatory Visit: Payer: Self-pay | Admitting: Family Medicine

## 2018-06-09 NOTE — Telephone Encounter (Signed)
Requested medication (s) are due for refill today: Yes  Requested medication (s) are on the active medication list: No  Last refill:  09/06/17  Future visit scheduled: Yes  Notes to clinic:  Originally ordered by Dr. Delia Chimes.    Requested Prescriptions  Pending Prescriptions Disp Refills   nystatin (MYCOSTATIN/NYSTOP) powder [Pharmacy Med Name: NYSTATIN 100,000 UNIT/GM POWD] 15 g 4    Sig: Apply topically 3 (three) times daily.     Off-Protocol Failed - 06/09/2018 10:21 AM      Failed - Medication not assigned to a protocol, review manually.      Passed - Valid encounter within last 12 months    Recent Outpatient Visits          1 month ago Right flank pain   Pamela Gibson, Pamela Gibson, Pamela Gibson   2 months ago Frequent urination   [facility] at [place], [place]   4 months ago Preventative health care   [facility] -[name], [name], Pamela Gibson   1 year ago Preventative health care   [facility] -[name], Pamela Gibson   1 year ago Urinary frequency   Primary Care at [facility], [name], Pamela Gibson      Future Appointments            In 1 month Pamela Gibson, [name], Pamela Gibson [name], [name]

## 2018-06-09 NOTE — Telephone Encounter (Signed)
Please advise if refill is appropriate 

## 2018-07-01 DIAGNOSIS — M5136 Other intervertebral disc degeneration, lumbar region: Secondary | ICD-10-CM | POA: Diagnosis not present

## 2018-07-01 DIAGNOSIS — M5416 Radiculopathy, lumbar region: Secondary | ICD-10-CM | POA: Diagnosis not present

## 2018-07-01 DIAGNOSIS — G894 Chronic pain syndrome: Secondary | ICD-10-CM | POA: Diagnosis not present

## 2018-07-01 DIAGNOSIS — M47816 Spondylosis without myelopathy or radiculopathy, lumbar region: Secondary | ICD-10-CM | POA: Diagnosis not present

## 2018-07-29 DIAGNOSIS — G894 Chronic pain syndrome: Secondary | ICD-10-CM | POA: Diagnosis not present

## 2018-07-29 DIAGNOSIS — M5416 Radiculopathy, lumbar region: Secondary | ICD-10-CM | POA: Diagnosis not present

## 2018-07-29 DIAGNOSIS — M47816 Spondylosis without myelopathy or radiculopathy, lumbar region: Secondary | ICD-10-CM | POA: Diagnosis not present

## 2018-07-29 DIAGNOSIS — M5136 Other intervertebral disc degeneration, lumbar region: Secondary | ICD-10-CM | POA: Diagnosis not present

## 2018-08-05 ENCOUNTER — Other Ambulatory Visit (INDEPENDENT_AMBULATORY_CARE_PROVIDER_SITE_OTHER): Payer: BLUE CROSS/BLUE SHIELD

## 2018-08-05 ENCOUNTER — Ambulatory Visit (INDEPENDENT_AMBULATORY_CARE_PROVIDER_SITE_OTHER): Payer: BLUE CROSS/BLUE SHIELD | Admitting: Internal Medicine

## 2018-08-05 ENCOUNTER — Encounter: Payer: Self-pay | Admitting: Internal Medicine

## 2018-08-05 VITALS — BP 110/76 | HR 67 | Temp 97.9°F | Ht 65.0 in | Wt 213.0 lb

## 2018-08-05 DIAGNOSIS — F419 Anxiety disorder, unspecified: Secondary | ICD-10-CM | POA: Diagnosis not present

## 2018-08-05 DIAGNOSIS — R8290 Unspecified abnormal findings in urine: Secondary | ICD-10-CM | POA: Diagnosis not present

## 2018-08-05 DIAGNOSIS — E785 Hyperlipidemia, unspecified: Secondary | ICD-10-CM | POA: Diagnosis not present

## 2018-08-05 LAB — CBC WITH DIFFERENTIAL/PLATELET
BASOS PCT: 0.9 % (ref 0.0–3.0)
Basophils Absolute: 0.1 10*3/uL (ref 0.0–0.1)
Eosinophils Absolute: 0.2 10*3/uL (ref 0.0–0.7)
Eosinophils Relative: 2.2 % (ref 0.0–5.0)
HCT: 46.2 % — ABNORMAL HIGH (ref 36.0–46.0)
Hemoglobin: 15.8 g/dL — ABNORMAL HIGH (ref 12.0–15.0)
Lymphocytes Relative: 38.4 % (ref 12.0–46.0)
Lymphs Abs: 2.7 10*3/uL (ref 0.7–4.0)
MCHC: 34.2 g/dL (ref 30.0–36.0)
MCV: 87.3 fl (ref 78.0–100.0)
MONO ABS: 0.6 10*3/uL (ref 0.1–1.0)
Monocytes Relative: 8.7 % (ref 3.0–12.0)
Neutro Abs: 3.5 10*3/uL (ref 1.4–7.7)
Neutrophils Relative %: 49.8 % (ref 43.0–77.0)
Platelets: 254 10*3/uL (ref 150.0–400.0)
RBC: 5.29 Mil/uL — ABNORMAL HIGH (ref 3.87–5.11)
RDW: 12.8 % (ref 11.5–15.5)
WBC: 7 10*3/uL (ref 4.0–10.5)

## 2018-08-05 LAB — URINALYSIS, ROUTINE W REFLEX MICROSCOPIC
Bilirubin Urine: NEGATIVE
Ketones, ur: NEGATIVE
Leukocytes, UA: NEGATIVE
Nitrite: NEGATIVE
SPECIFIC GRAVITY, URINE: 1.01 (ref 1.000–1.030)
TOTAL PROTEIN, URINE-UPE24: NEGATIVE
Urine Glucose: NEGATIVE
Urobilinogen, UA: 2 — AB (ref 0.0–1.0)
pH: 6.5 (ref 5.0–8.0)

## 2018-08-05 LAB — BASIC METABOLIC PANEL
BUN: 10 mg/dL (ref 6–23)
CO2: 30 mEq/L (ref 19–32)
Calcium: 9.9 mg/dL (ref 8.4–10.5)
Chloride: 104 mEq/L (ref 96–112)
Creatinine, Ser: 0.9 mg/dL (ref 0.40–1.20)
GFR: 69.56 mL/min (ref >60.00)
Glucose, Bld: 92 mg/dL (ref 70–99)
Potassium: 4.5 mEq/L (ref 3.5–5.1)
Sodium: 140 mEq/L (ref 135–145)

## 2018-08-05 LAB — LIPID PANEL
CHOLESTEROL: 145 mg/dL (ref 0–200)
HDL: 45 mg/dL (ref >39.00)
LDL Cholesterol: 69 mg/dL (ref 0–99)
NonHDL: 99.79
Total CHOL/HDL Ratio: 3
Triglycerides: 155 mg/dL — ABNORMAL HIGH (ref 0.0–149.0)
VLDL: 31 mg/dL (ref 0.0–40.0)

## 2018-08-05 LAB — HEPATIC FUNCTION PANEL
ALT: 20 U/L (ref 0–35)
AST: 18 U/L (ref 0–37)
Albumin: 4.3 g/dL (ref 3.5–5.2)
Alkaline Phosphatase: 53 U/L (ref 39–117)
Bilirubin, Direct: 0.1 mg/dL (ref 0.0–0.3)
Total Bilirubin: 0.7 mg/dL (ref 0.2–1.2)
Total Protein: 7.1 g/dL (ref 6.0–8.3)

## 2018-08-05 NOTE — Assessment & Plan Note (Signed)
stable overall by history and exam, recent data reviewed with pt, and pt to continue medical treatment as before,  to f/u any worsening symptoms or concerns  

## 2018-08-05 NOTE — Patient Instructions (Signed)

## 2018-08-05 NOTE — Progress Notes (Signed)
Subjective:    Patient ID: Pamela Gibson, female    DOB: [birth year], 52 y.o.   MRN: [id number]  HPI  Here to f/u; overall doing ok,  Pt denies chest pain, increasing sob or doe, wheezing, orthopnea, PND, increased LE swelling, palpitations, dizziness or syncope.  Pt denies new neurological symptoms such as new headache, or facial or extremity weakness or numbness.  Pt denies polydipsia, polyuria, or low sugar episode.  Pt states overall good compliance with meds, mostly trying to follow appropriate diet, with wt overall stable,  but little exercise however. No new complaints except for cloudy urine coming back x 2 days after UTI sept 2019 and once before that this yr as well, Denies urinary symptoms such as dysuria, frequency, urgency, flank pain, hematuria or n/v, fever, chills.  Denies worsening depressive symptoms, suicidal ideation, or panic;  Past Medical History:  Diagnosis Date  . Anxiety   . BACK PAIN 07/23/2007  . CHEST PAIN 09/04/2010  . Cyst of right ovary 07/03/2015  . DEGENERATIVE JOINT DISEASE, LUMBAR SPINE 07/23/2007  . Depression   . Gastric polyp    x3  . GERD 07/22/2007  . H/O total hysterectomy   . HYPERLIPIDEMIA 07/22/2007  . IBS 07/23/2007  . MITRAL VALVE PROLAPSE 07/22/2007  . Pinched nerve    LEFT SIDE, DISC 3,4,5  . Pneumonia    YEARS AGO  . SINUSITIS- ACUTE-NOS 09/09/2007  . TACHYCARDIA 08/20/2010  . Tuberculosis     MOTHER HAD YEARS AGO  . Unspecified Vaginitis and Vulvovaginitis 12/17/2007   Past Surgical History:  Procedure Laterality Date  . ABDOMINAL HYSTERECTOMY    . CHOLECYSTECTOMY    . CYSTOSCOPY  11/11/2017   Procedure: CYSTOSCOPY;  Surgeon: Janyth Pupa, DO;  Location: [facility];  Service: Gynecology;;  . LAPAROSCOPIC BILATERAL SALPINGO OOPHERECTOMY Bilateral 11/11/2017   Procedure: LAPAROSCOPIC BILATERAL SALPINGO OOPHORECTOMY;  Surgeon: Janyth Pupa, DO;  Location: [facility];  Service: Gynecology;  Laterality: Bilateral;  . RLE tib/fib fx/surgury     . TONSILLECTOMY    . TUBAL LIGATION      reports that she has never smoked. She has never used smokeless tobacco. She reports that she does not drink alcohol or use drugs. family history includes Angina in her mother; Colon polyps in her mother; Coronary artery disease in her mother; Diabetes in an other family member; Heart disease in her mother; Hyperlipidemia in her unknown relative; Hypertension in an other family member; Stroke in her mother. Allergies  Allergen Reactions  . Atorvastatin     REACTION: more forgetful  . Ibuprofen     INSTRUCTED BY MD NOT TO TAKE DUE TO GI UPSET  . Penicillins     turns blue, childhood allergy Has patient had a PCN reaction causing immediate rash, facial/tongue/throat swelling, SOB or lightheadedness with hypotension: Yes Has patient had a PCN reaction causing severe rash involving mucus membranes or skin necrosis: No Has patient had a PCN reaction that required hospitalization: Unknown  Has patient had a PCN reaction occurring within the last 10 years: No If all of the above answers are "NO", then may proceed with Cephalosporin use.   . Simvastatin     REACTION: more forgetful  . Tetracycline     REACTION: itch   Current Outpatient Medications on File Prior to Visit  Medication Sig Dispense Refill  . ALPRAZolam (XANAX) 1 MG tablet TAKE 1 TABLET BY MOUTH THREE TIMES A DAY AS NEEDED FOR ANXIETY 90 tablet 2  . aspirin 81  MG EC tablet Take 81 mg by mouth daily.      . calcium carbonate (TUMS - DOSED IN MG ELEMENTAL CALCIUM) 500 MG chewable tablet Chew 1-2 tablets by mouth daily as needed for indigestion or heartburn.    . nystatin (MYCOSTATIN/NYSTOP) powder Use as directed topically to affected area daily as needed 60 g 2  . oxyCODONE-acetaminophen (PERCOCET) 10-325 MG tablet Take 1 tablet by mouth every 4 (four) hours as needed for pain.    . polyethylene glycol (MIRALAX / GLYCOLAX) packet Take 17 g by mouth daily as needed.    . pravastatin  (PRAVACHOL) 40 MG tablet TAKE 1 TABLET BY MOUTH DAILY 90 tablet 3   No current facility-administered medications on file prior to visit.     Review of Systems  Constitutional: Negative for other unusual diaphoresis or sweats HENT: Negative for ear discharge or swelling Eyes: Negative for other worsening visual disturbances Respiratory: Negative for stridor or other swelling  Gastrointestinal: Negative for worsening distension or other blood Genitourinary: Negative for retention or other urinary change Musculoskeletal: Negative for other MSK pain or swelling Skin: Negative for color change or other new lesions Neurological: Negative for worsening tremors and other numbness  Psychiatric/Behavioral: Negative for worsening agitation or other fatigue All other system neg per pt    Objective:   Physical Exam BP 110/76   Pulse 67   Temp 97.9 F (36.6 C) (Oral)   Ht 5\' 5"  (1.651 m)   Wt 213 lb (96.6 kg)   SpO2 96%   BMI 35.45 kg/m  VS noted,  Constitutional: Pt appears in NAD HENT: Head: NCAT.  Right Ear: External ear normal.  Left Ear: External ear normal.  Eyes: . Pupils are equal, round, and reactive to light. Conjunctivae and EOM are normal Nose: without d/c or deformity Neck: Neck supple. Gross normal ROM Cardiovascular: Normal rate and regular rhythm.   Pulmonary/Chest: Effort normal and breath sounds without rales or wheezing.  Abd:  Soft, NT, ND, + BS, no organomegaly Neurological: Pt is alert. At baseline orientation, motor grossly intact Skin: Skin is warm. No rashes, other new lesions, no LE edema Psychiatric: Pt behavior is normal without agitation , not depressed affect No other exam findings Lab Results  Component Value Date   WBC 7.4 05/04/2018   HGB 15.7 (H) 05/04/2018   HCT 45.7 05/04/2018   PLT 263.0 05/04/2018   GLUCOSE 93 05/04/2018   CHOL 161 01/26/2018   TRIG 123.0 01/26/2018   HDL 45.20 01/26/2018   LDLDIRECT 128.8 07/18/2008   LDLCALC 91  01/26/2018   ALT 22 01/26/2018   AST 19 01/26/2018   NA 141 05/04/2018   K 4.3 05/04/2018   CL 104 05/04/2018   CREATININE 0.94 05/04/2018   BUN 11 05/04/2018   CO2 30 05/04/2018   TSH 2.95 01/26/2018   HGBA1C 5.7 11/01/2016       Assessment & Plan:

## 2018-08-05 NOTE — Assessment & Plan Note (Signed)
?   Recurrent early UTI - for urine studies

## 2018-08-05 NOTE — Assessment & Plan Note (Signed)
stable overall by history and exam, recent data reviewed with pt, and pt to continue medical treatment as before,  to f/u any worsening symptoms or concerns, for f/u lbas

## 2018-08-06 LAB — URINE CULTURE
MICRO NUMBER:: 91489614
Result:: NO GROWTH
SPECIMEN QUALITY:: ADEQUATE

## 2018-08-20 ENCOUNTER — Other Ambulatory Visit: Payer: Self-pay | Admitting: Internal Medicine

## 2018-08-20 NOTE — Telephone Encounter (Signed)
   LOV:08/05/18 NextOV:02/08/19 Last Filled/Quantity:07/19/18 90#

## 2018-08-30 DIAGNOSIS — M47816 Spondylosis without myelopathy or radiculopathy, lumbar region: Secondary | ICD-10-CM | POA: Diagnosis not present

## 2018-08-30 DIAGNOSIS — G894 Chronic pain syndrome: Secondary | ICD-10-CM | POA: Diagnosis not present

## 2018-08-30 DIAGNOSIS — M5136 Other intervertebral disc degeneration, lumbar region: Secondary | ICD-10-CM | POA: Diagnosis not present

## 2018-08-30 DIAGNOSIS — M5416 Radiculopathy, lumbar region: Secondary | ICD-10-CM | POA: Diagnosis not present

## 2018-09-17 DIAGNOSIS — Z01411 Encounter for gynecological examination (general) (routine) with abnormal findings: Secondary | ICD-10-CM | POA: Diagnosis not present

## 2018-09-20 ENCOUNTER — Other Ambulatory Visit: Payer: Self-pay | Admitting: Internal Medicine

## 2018-09-20 NOTE — Telephone Encounter (Signed)
Done erx 

## 2018-09-29 DIAGNOSIS — G894 Chronic pain syndrome: Secondary | ICD-10-CM | POA: Diagnosis not present

## 2018-09-29 DIAGNOSIS — Z79891 Long term (current) use of opiate analgesic: Secondary | ICD-10-CM | POA: Diagnosis not present

## 2018-09-29 DIAGNOSIS — M47816 Spondylosis without myelopathy or radiculopathy, lumbar region: Secondary | ICD-10-CM | POA: Diagnosis not present

## 2018-09-29 DIAGNOSIS — M5136 Other intervertebral disc degeneration, lumbar region: Secondary | ICD-10-CM | POA: Diagnosis not present

## 2018-09-29 DIAGNOSIS — M5416 Radiculopathy, lumbar region: Secondary | ICD-10-CM | POA: Diagnosis not present

## 2018-10-27 DIAGNOSIS — M47816 Spondylosis without myelopathy or radiculopathy, lumbar region: Secondary | ICD-10-CM | POA: Diagnosis not present

## 2018-10-27 DIAGNOSIS — M5136 Other intervertebral disc degeneration, lumbar region: Secondary | ICD-10-CM | POA: Diagnosis not present

## 2018-10-27 DIAGNOSIS — G894 Chronic pain syndrome: Secondary | ICD-10-CM | POA: Diagnosis not present

## 2018-10-27 DIAGNOSIS — M5416 Radiculopathy, lumbar region: Secondary | ICD-10-CM | POA: Diagnosis not present

## 2018-11-24 DIAGNOSIS — M47816 Spondylosis without myelopathy or radiculopathy, lumbar region: Secondary | ICD-10-CM | POA: Diagnosis not present

## 2018-11-24 DIAGNOSIS — M5416 Radiculopathy, lumbar region: Secondary | ICD-10-CM | POA: Diagnosis not present

## 2018-11-24 DIAGNOSIS — M5136 Other intervertebral disc degeneration, lumbar region: Secondary | ICD-10-CM | POA: Diagnosis not present

## 2018-11-24 DIAGNOSIS — G894 Chronic pain syndrome: Secondary | ICD-10-CM | POA: Diagnosis not present

## 2018-12-22 ENCOUNTER — Other Ambulatory Visit: Payer: Self-pay | Admitting: Internal Medicine

## 2018-12-22 DIAGNOSIS — M47816 Spondylosis without myelopathy or radiculopathy, lumbar region: Secondary | ICD-10-CM | POA: Diagnosis not present

## 2018-12-22 DIAGNOSIS — G894 Chronic pain syndrome: Secondary | ICD-10-CM | POA: Diagnosis not present

## 2018-12-22 DIAGNOSIS — M5416 Radiculopathy, lumbar region: Secondary | ICD-10-CM | POA: Diagnosis not present

## 2018-12-22 DIAGNOSIS — M5136 Other intervertebral disc degeneration, lumbar region: Secondary | ICD-10-CM | POA: Diagnosis not present

## 2018-12-23 NOTE — Telephone Encounter (Signed)
Done erx 

## 2018-12-23 NOTE — Telephone Encounter (Signed)
[place] Controlled Database Checked Last filled: 11/17/18 # 90 LOV w/you: 08/05/18 Next appt w/you: 02/08/19

## 2019-01-19 DIAGNOSIS — M5136 Other intervertebral disc degeneration, lumbar region: Secondary | ICD-10-CM | POA: Diagnosis not present

## 2019-01-19 DIAGNOSIS — G894 Chronic pain syndrome: Secondary | ICD-10-CM | POA: Diagnosis not present

## 2019-01-19 DIAGNOSIS — M47816 Spondylosis without myelopathy or radiculopathy, lumbar region: Secondary | ICD-10-CM | POA: Diagnosis not present

## 2019-01-19 DIAGNOSIS — M5416 Radiculopathy, lumbar region: Secondary | ICD-10-CM | POA: Diagnosis not present

## 2019-02-08 ENCOUNTER — Other Ambulatory Visit (INDEPENDENT_AMBULATORY_CARE_PROVIDER_SITE_OTHER): Payer: BC Managed Care – PPO

## 2019-02-08 ENCOUNTER — Encounter: Payer: Self-pay | Admitting: Internal Medicine

## 2019-02-08 ENCOUNTER — Other Ambulatory Visit: Payer: Self-pay

## 2019-02-08 ENCOUNTER — Ambulatory Visit (INDEPENDENT_AMBULATORY_CARE_PROVIDER_SITE_OTHER): Payer: BC Managed Care – PPO | Admitting: Internal Medicine

## 2019-02-08 ENCOUNTER — Other Ambulatory Visit: Payer: Self-pay | Admitting: Internal Medicine

## 2019-02-08 ENCOUNTER — Telehealth: Payer: Self-pay

## 2019-02-08 VITALS — BP 122/82 | HR 74 | Temp 98.0°F | Ht 65.0 in | Wt 212.0 lb

## 2019-02-08 DIAGNOSIS — E559 Vitamin D deficiency, unspecified: Secondary | ICD-10-CM

## 2019-02-08 DIAGNOSIS — E538 Deficiency of other specified B group vitamins: Secondary | ICD-10-CM

## 2019-02-08 DIAGNOSIS — E611 Iron deficiency: Secondary | ICD-10-CM

## 2019-02-08 DIAGNOSIS — Z0001 Encounter for general adult medical examination with abnormal findings: Secondary | ICD-10-CM

## 2019-02-08 DIAGNOSIS — G4710 Hypersomnia, unspecified: Secondary | ICD-10-CM

## 2019-02-08 DIAGNOSIS — R739 Hyperglycemia, unspecified: Secondary | ICD-10-CM

## 2019-02-08 DIAGNOSIS — D751 Secondary polycythemia: Secondary | ICD-10-CM

## 2019-02-08 LAB — HEPATIC FUNCTION PANEL
ALT: 19 U/L (ref 0–35)
AST: 16 U/L (ref 0–37)
Albumin: 4.5 g/dL (ref 3.5–5.2)
Alkaline Phosphatase: 55 U/L (ref 39–117)
Bilirubin, Direct: 0.1 mg/dL (ref 0.0–0.3)
Total Bilirubin: 0.6 mg/dL (ref 0.2–1.2)
Total Protein: 7.2 g/dL (ref 6.0–8.3)

## 2019-02-08 LAB — BASIC METABOLIC PANEL
BUN: 15 mg/dL (ref 6–23)
CO2: 28 mEq/L (ref 19–32)
Calcium: 9.8 mg/dL (ref 8.4–10.5)
Chloride: 101 mEq/L (ref 96–112)
Creatinine, Ser: 0.86 mg/dL (ref 0.40–1.20)
GFR: 68.84 mL/min (ref >60.00)
Glucose, Bld: 87 mg/dL (ref 70–99)
Potassium: 3.8 mEq/L (ref 3.5–5.1)
Sodium: 140 mEq/L (ref 135–145)

## 2019-02-08 LAB — CBC WITH DIFFERENTIAL/PLATELET
Basophils Absolute: 0.1 10*3/uL (ref 0.0–0.1)
Basophils Relative: 1 % (ref 0.0–3.0)
Eosinophils Absolute: 0.2 10*3/uL (ref 0.0–0.7)
Eosinophils Relative: 2.1 % (ref 0.0–5.0)
HCT: 46.3 % — ABNORMAL HIGH (ref 36.0–46.0)
Hemoglobin: 15.6 g/dL — ABNORMAL HIGH (ref 12.0–15.0)
Lymphocytes Relative: 42.7 % (ref 12.0–46.0)
Lymphs Abs: 3.1 10*3/uL (ref 0.7–4.0)
MCHC: 33.7 g/dL (ref 30.0–36.0)
MCV: 89.2 fl (ref 78.0–100.0)
Monocytes Absolute: 0.6 10*3/uL (ref 0.1–1.0)
Monocytes Relative: 7.9 % (ref 3.0–12.0)
Neutro Abs: 3.4 10*3/uL (ref 1.4–7.7)
Neutrophils Relative %: 46.3 % (ref 43.0–77.0)
Platelets: 261 10*3/uL (ref 150.0–400.0)
RBC: 5.19 Mil/uL — ABNORMAL HIGH (ref 3.87–5.11)
RDW: 13.1 % (ref 11.5–15.5)
WBC: 7.3 10*3/uL (ref 4.0–10.5)

## 2019-02-08 LAB — LIPID PANEL
Cholesterol: 174 mg/dL (ref 0–200)
HDL: 51.1 mg/dL (ref >39.00)
LDL Cholesterol: 99 mg/dL (ref 0–99)
NonHDL: 122.53
Total CHOL/HDL Ratio: 3
Triglycerides: 117 mg/dL (ref 0.0–149.0)
VLDL: 23.4 mg/dL (ref 0.0–40.0)

## 2019-02-08 LAB — URINALYSIS, ROUTINE W REFLEX MICROSCOPIC
Bilirubin Urine: NEGATIVE
Ketones, ur: NEGATIVE
Leukocytes,Ua: NEGATIVE
Nitrite: NEGATIVE
Specific Gravity, Urine: 1.01 (ref 1.000–1.030)
Total Protein, Urine: NEGATIVE
Urine Glucose: NEGATIVE
Urobilinogen, UA: 1 (ref 0.0–1.0)
pH: 6.5 (ref 5.0–8.0)

## 2019-02-08 LAB — TSH: TSH: 1.81 u[IU]/mL (ref 0.35–4.50)

## 2019-02-08 LAB — VITAMIN D 25 HYDROXY (VIT D DEFICIENCY, FRACTURES): VITD: 25.08 ng/mL — ABNORMAL LOW (ref 30.00–100.00)

## 2019-02-08 LAB — IBC PANEL
Iron: 134 ug/dL (ref 42–145)
Saturation Ratios: 36.4 % (ref 20.0–50.0)
Transferrin: 263 mg/dL (ref 212.0–360.0)

## 2019-02-08 LAB — HEMOGLOBIN A1C: Hgb A1c MFr Bld: 5.7 % (ref 4.6–6.5)

## 2019-02-08 LAB — VITAMIN B12: Vitamin B-12: 158 pg/mL — ABNORMAL LOW (ref 211–911)

## 2019-02-08 MED ORDER — VITAMIN D (ERGOCALCIFEROL) 1.25 MG (50000 UNIT) PO CAPS: 50000.0000 [IU] | ORAL_CAPSULE | ORAL | 0 refills | Status: DC

## 2019-02-08 NOTE — Assessment & Plan Note (Addendum)
Cant r/o osa, for pulm referral  In addition to the time spent performing CPE, I spent an additional 15 minutes face to face,in which greater than 50% of this time was spent in counseling and coordination of care for patient's illness as documented, including the differential dx, treatment, further evaluation and other management of hypersomnolence, hyperglycemia, polycythemia

## 2019-02-08 NOTE — Telephone Encounter (Signed)
-----   Message from Biagio Borg, MD sent at 02/08/2019  2:07 PM EDT ----- Left message on MyChart, pt to cont same tx except  The test results show that your current treatment is OK, except the Vitamin D and Vitamin B12 levels are low.   We need to:  1)  Please take Vitamin D 50000 units weekly for 12 weeks, then plan to change to OTC Vitamin D3 at 2000 units per day, indefinitely. 2)   Start monthly b12 shots for at least until next visit, and we may be able to change to pills.Redmond Baseman to please inform pt, I will do rx, and pt needs scheduled for the b12 shots.

## 2019-02-08 NOTE — Assessment & Plan Note (Signed)
stable overall by history and exam, recent data reviewed with pt, and pt to continue medical treatment as before,  to f/u any worsening symptoms or concerns  

## 2019-02-08 NOTE — Assessment & Plan Note (Signed)
Mild, ? Related to osa, for contd f/u

## 2019-02-08 NOTE — Patient Instructions (Signed)
Please continue all other medications as before, and refills have been done if requested.  Please have the pharmacy call with any other refills you may need.  Please continue your efforts at being more active, low cholesterol diet, and weight control.  You are otherwise up to date with prevention measures today.  Please keep your appointments with your specialists as you may have planned  You will be contacted regarding the referral for: pulmonary for possible sleep apnea  Please go to the LAB in the Basement (turn left off the elevator) for the tests to be done today  You will be contacted by phone if any changes need to be made immediately.  Otherwise, you will receive a letter about your results with an explanation, but please check with MyChart first.  Please remember to sign up for MyChart if you have not done so, as this will be important to you in the future with finding out test results, communicating by private email, and scheduling acute appointments online when needed.  Please return in 6 months, or sooner if needed, with Lab testing done 3-5 days before

## 2019-02-08 NOTE — Telephone Encounter (Signed)
Pt has viewed results via MyChart  

## 2019-02-08 NOTE — Progress Notes (Signed)
Subjective:    Patient ID: Pamela Gibson, female    DOB: July 23, 1966, 53 y.o.   MRN: [id number]  HPI  Here for wellness and f/u;  Overall doing ok;  Pt denies Chest pain, worsening SOB, DOE, wheezing, orthopnea, PND, worsening LE edema, palpitations, dizziness or syncope.  Pt denies neurological change such as new headache, facial or extremity weakness.  Pt denies polydipsia, polyuria, or low sugar symptoms. Pt states overall good compliance with treatment and medications, good tolerability, and has been trying to follow appropriate diet.  Pt denies worsening depressive symptoms, suicidal ideation or panic. No fever, night sweats, wt loss, loss of appetite, or other constitutional symptoms.  Pt states good ability with ADL's, has low fall risk, home safety reviewed and adequate, no other significant changes in hearing or vision, and only occasionally active with exercise. Wt Readings from Last 3 Encounters:  02/08/19 212 lb (96.2 kg)  08/05/18 213 lb (96.6 kg)  05/04/18 214 lb (97.1 kg)  Also c/o daytime hypersomnolence for several months with non restful sleep and naps many days, mild, intermittent, nothing else makes better or worse. Past Medical History:  Diagnosis Date  . Anxiety   . BACK PAIN 07/23/2007  . CHEST PAIN 09/04/2010  . Cyst of right ovary 07/03/2015  . DEGENERATIVE JOINT DISEASE, LUMBAR SPINE 07/23/2007  . Depression   . Gastric polyp    x3  . GERD 07/22/2007  . H/O total hysterectomy   . HYPERLIPIDEMIA 07/22/2007  . IBS 07/23/2007  . MITRAL VALVE PROLAPSE 07/22/2007  . Pinched nerve    LEFT SIDE, DISC 3,4,5  . Pneumonia    YEARS AGO  . SINUSITIS- ACUTE-NOS 09/09/2007  . TACHYCARDIA 08/20/2010  . Tuberculosis     MOTHER HAD YEARS AGO  . Unspecified Vaginitis and Vulvovaginitis 12/17/2007   Past Surgical History:  Procedure Laterality Date  . ABDOMINAL HYSTERECTOMY    . CHOLECYSTECTOMY    . CYSTOSCOPY  11/11/2017   Procedure: CYSTOSCOPY;  Surgeon: Janyth Pupa,  DO;  Location: [facility];  Service: Gynecology;;  . LAPAROSCOPIC BILATERAL SALPINGO OOPHERECTOMY Bilateral 11/11/2017   Procedure: LAPAROSCOPIC BILATERAL SALPINGO OOPHORECTOMY;  Surgeon: Janyth Pupa, DO;  Location: [facility];  Service: Gynecology;  Laterality: Bilateral;  . RLE tib/fib fx/surgury    . TONSILLECTOMY    . TUBAL LIGATION      reports that she has never smoked. She has never used smokeless tobacco. She reports that she does not drink alcohol or use drugs. family history includes Angina in her mother; Colon polyps in her mother; Coronary artery disease in her mother; Diabetes in an other family member; Heart disease in her mother; Hyperlipidemia in her unknown relative; Hypertension in an other family member; Stroke in her mother. Allergies  Allergen Reactions  . Atorvastatin     REACTION: more forgetful  . Ibuprofen     INSTRUCTED BY MD NOT TO TAKE DUE TO GI UPSET  . Penicillins     turns blue, childhood allergy Has patient had a PCN reaction causing immediate rash, facial/tongue/throat swelling, SOB or lightheadedness with hypotension: Yes Has patient had a PCN reaction causing severe rash involving mucus membranes or skin necrosis: No Has patient had a PCN reaction that required hospitalization: Unknown  Has patient had a PCN reaction occurring within the last 10 years: No If all of the above answers are "NO", then may proceed with Cephalosporin use.   . Simvastatin     REACTION: more forgetful  . Tetracycline  REACTION: itch   Current Outpatient Medications on File Prior to Visit  Medication Sig Dispense Refill  . ALPRAZolam (XANAX) 1 MG tablet TAKE 1 TABLET BY MOUTH THREE TIMES A DAY AS NEEDED FOR ANXIETY 90 tablet 2  . aspirin 81 MG EC tablet Take 81 mg by mouth daily.      . calcium carbonate (TUMS - DOSED IN MG ELEMENTAL CALCIUM) 500 MG chewable tablet Chew 1-2 tablets by mouth daily as needed for indigestion or heartburn.    . nystatin (MYCOSTATIN/NYSTOP) powder  Use as directed topically to affected area daily as needed 60 g 2  . oxyCODONE-acetaminophen (PERCOCET) 10-325 MG tablet Take 1 tablet by mouth every 4 (four) hours as needed for pain.    . polyethylene glycol (MIRALAX / GLYCOLAX) packet Take 17 g by mouth daily as needed.    . pravastatin (PRAVACHOL) 40 MG tablet TAKE 1 TABLET BY MOUTH DAILY 90 tablet 3   No current facility-administered medications on file prior to visit.    Review of Systems Constitutional: Negative for other unusual diaphoresis, sweats, appetite or weight changes HENT: Negative for other worsening hearing loss, ear pain, facial swelling, mouth sores or neck stiffness.   Eyes: Negative for other worsening pain, redness or other visual disturbance.  Respiratory: Negative for other stridor or swelling Cardiovascular: Negative for other palpitations or other chest pain  Gastrointestinal: Negative for worsening diarrhea or loose stools, blood in stool, distention or other pain Genitourinary: Negative for hematuria, flank pain or other change in urine volume.  Musculoskeletal: Negative for myalgias or other joint swelling.  Skin: Negative for other color change, or other wound or worsening drainage.  Neurological: Negative for other syncope or numbness. Hematological: Negative for other adenopathy or swelling Psychiatric/Behavioral: Negative for hallucinations, other worsening agitation, SI, self-injury, or new decreased concentration All other system neg per pt    Objective:   Physical Exam BP 122/82   Pulse 74   Temp 98 F (36.7 C) (Oral)   Ht 5\' 5"  (1.651 m)   Wt 212 lb (96.2 kg)   SpO2 99%   BMI 35.28 kg/m  VS noted,  Constitutional: Pt is oriented to person, place, and time. Appears well-developed and well-nourished, in no significant distress and comfortable Head: Normocephalic and atraumatic  Eyes: Conjunctivae and EOM are normal. Pupils are equal, round, and reactive to light Right Ear: External ear normal  without discharge Left Ear: External ear normal without discharge Nose: Nose without discharge or deformity Mouth/Throat: Oropharynx is without other ulcerations and moist  Neck: Normal range of motion. Neck supple. No JVD present. No tracheal deviation present or significant neck LA or mass Cardiovascular: Normal rate, regular rhythm, normal heart sounds and intact distal pulses.   Pulmonary/Chest: WOB normal and breath sounds without rales or wheezing  Abdominal: Soft. Bowel sounds are normal. NT. No HSM  Musculoskeletal: Normal range of motion. Exhibits no edema Lymphadenopathy: Has no other cervical adenopathy.  Neurological: Pt is alert and oriented to person, place, and time. Pt has normal reflexes. No cranial nerve deficit. Motor grossly intact, Gait intact Skin: Skin is warm and dry. No rash noted or new ulcerations Psychiatric:  Has normal mood and affect. Behavior is normal without agitation No other exam findings  Lab Results  Component Value Date   WBC 7.0 08/05/2018   HGB 15.8 (H) 08/05/2018   HCT 46.2 (H) 08/05/2018   PLT 254.0 08/05/2018   GLUCOSE 92 08/05/2018   CHOL 145 08/05/2018   TRIG  155.0 (H) 08/05/2018   HDL 45.00 08/05/2018   LDLDIRECT 128.8 07/18/2008   LDLCALC 69 08/05/2018   ALT 20 08/05/2018   AST 18 08/05/2018   NA 140 08/05/2018   K 4.5 08/05/2018   CL 104 08/05/2018   CREATININE 0.90 08/05/2018   BUN 10 08/05/2018   CO2 30 08/05/2018   TSH 2.95 01/26/2018   HGBA1C 5.7 11/01/2016        Assessment & Plan:

## 2019-02-08 NOTE — Assessment & Plan Note (Signed)

## 2019-02-09 ENCOUNTER — Ambulatory Visit (INDEPENDENT_AMBULATORY_CARE_PROVIDER_SITE_OTHER): Payer: BC Managed Care – PPO

## 2019-02-09 DIAGNOSIS — E538 Deficiency of other specified B group vitamins: Secondary | ICD-10-CM | POA: Diagnosis not present

## 2019-02-09 MED ORDER — CYANOCOBALAMIN 1000 MCG/ML IJ SOLN
1000.0000 ug | Freq: Once | INTRAMUSCULAR | Status: AC
  Administered 2019-02-09: 1000 ug via INTRAMUSCULAR

## 2019-02-09 NOTE — Progress Notes (Signed)
Medical screening examination/treatment/procedure(s) were performed by non-physician practitioner and as supervising physician I was immediately available for consultation/collaboration. I agree with above. James John, MD   

## 2019-02-11 ENCOUNTER — Other Ambulatory Visit: Payer: Self-pay | Admitting: Internal Medicine

## 2019-02-16 DIAGNOSIS — M5136 Other intervertebral disc degeneration, lumbar region: Secondary | ICD-10-CM | POA: Diagnosis not present

## 2019-02-16 DIAGNOSIS — M47816 Spondylosis without myelopathy or radiculopathy, lumbar region: Secondary | ICD-10-CM | POA: Diagnosis not present

## 2019-02-16 DIAGNOSIS — M5416 Radiculopathy, lumbar region: Secondary | ICD-10-CM | POA: Diagnosis not present

## 2019-02-16 DIAGNOSIS — G894 Chronic pain syndrome: Secondary | ICD-10-CM | POA: Diagnosis not present

## 2019-03-03 ENCOUNTER — Other Ambulatory Visit: Payer: Self-pay | Admitting: Obstetrics & Gynecology

## 2019-03-03 DIAGNOSIS — Z1231 Encounter for screening mammogram for malignant neoplasm of breast: Secondary | ICD-10-CM

## 2019-03-14 ENCOUNTER — Ambulatory Visit (INDEPENDENT_AMBULATORY_CARE_PROVIDER_SITE_OTHER): Payer: BC Managed Care – PPO

## 2019-03-14 ENCOUNTER — Other Ambulatory Visit: Payer: Self-pay

## 2019-03-14 DIAGNOSIS — E538 Deficiency of other specified B group vitamins: Secondary | ICD-10-CM | POA: Diagnosis not present

## 2019-03-14 MED ORDER — CYANOCOBALAMIN 1000 MCG/ML IJ SOLN
1000.0000 ug | Freq: Once | INTRAMUSCULAR | Status: AC
  Administered 2019-03-14: 14:00:00 1000 ug via INTRAMUSCULAR

## 2019-03-14 NOTE — Progress Notes (Signed)
Medical screening examination/treatment/procedure(s) were performed by non-physician practitioner and as supervising physician I was immediately available for consultation/collaboration. I agree with above. James John, MD   

## 2019-03-16 DIAGNOSIS — M5416 Radiculopathy, lumbar region: Secondary | ICD-10-CM | POA: Diagnosis not present

## 2019-03-16 DIAGNOSIS — M47816 Spondylosis without myelopathy or radiculopathy, lumbar region: Secondary | ICD-10-CM | POA: Diagnosis not present

## 2019-03-16 DIAGNOSIS — M5136 Other intervertebral disc degeneration, lumbar region: Secondary | ICD-10-CM | POA: Diagnosis not present

## 2019-03-16 DIAGNOSIS — G894 Chronic pain syndrome: Secondary | ICD-10-CM | POA: Diagnosis not present

## 2019-03-24 ENCOUNTER — Other Ambulatory Visit: Payer: Self-pay

## 2019-03-24 ENCOUNTER — Ambulatory Visit: Payer: BC Managed Care – PPO | Admitting: Pulmonary Disease

## 2019-03-24 ENCOUNTER — Encounter: Payer: Self-pay | Admitting: Pulmonary Disease

## 2019-03-24 VITALS — BP 130/62 | HR 84 | Temp 98.1°F | Ht 65.0 in | Wt 213.0 lb

## 2019-03-24 DIAGNOSIS — G4733 Obstructive sleep apnea (adult) (pediatric): Secondary | ICD-10-CM | POA: Diagnosis not present

## 2019-03-24 NOTE — Patient Instructions (Signed)
Moderate probability of significant obstructive sleep apnea  We will schedule him for home sleep study We will update you with study results  Treatment options as discussed  We will see back in the office in about 3 months Call with significant concerns

## 2019-03-24 NOTE — Progress Notes (Signed)
Subjective:    Patient ID: Pamela Gibson, female    DOB: 04-21-1966, 53 y.o.   MRN: [id number]  Patient being seen for nonrestorative sleep Frequent awakenings at night  Has been told by her spouse that she snores, sometimes quite loud He has also woken up from sleep asking her if everything was okay-unclear whether it is because she was holding her breath or because of loud snoring Her weight has been stable Admits to dryness of her mouth in the mornings No headaches Memory is occasionally fuzzy  Has no difficulty falling asleep Bedtime is very flexible Will usually wake up up to 4 times during the night Hardly ever sleeps more than 2 to 3 hours at a stretch Final awakening time about 8 AM  Mother snores  Never smoker Homemaker  Had pet birds  Never diagnosed with any lung disease  Past Medical History:  Diagnosis Date  . Anxiety   . BACK PAIN 07/23/2007  . CHEST PAIN 09/04/2010  . Cyst of right ovary 07/03/2015  . DEGENERATIVE JOINT DISEASE, LUMBAR SPINE 07/23/2007  . Depression   . Gastric polyp    x3  . GERD 07/22/2007  . H/O total hysterectomy   . HYPERLIPIDEMIA 07/22/2007  . IBS 07/23/2007  . MITRAL VALVE PROLAPSE 07/22/2007  . Pinched nerve    LEFT SIDE, DISC 3,4,5  . Pneumonia    YEARS AGO  . SINUSITIS- ACUTE-NOS 09/09/2007  . TACHYCARDIA 08/20/2010  . Tuberculosis     MOTHER HAD YEARS AGO  . Unspecified Vaginitis and Vulvovaginitis 12/17/2007   Family History  Problem Relation Age of Onset  . Colon polyps Mother   . Angina Mother   . Heart disease Mother        CVA  . Coronary artery disease Mother   . Stroke Mother   . Hypertension Other   . Diabetes Other   . Hyperlipidemia Unknown    Social history reviewed  Review of Systems  Constitutional: Negative for fever and unexpected weight change.  HENT: Negative for congestion, dental problem, ear pain, nosebleeds, postnasal drip, rhinorrhea, sinus pressure, sneezing, sore throat and trouble  swallowing.   Eyes: Negative for redness and itching.  Respiratory: Negative for cough, chest tightness, shortness of breath and wheezing.   Cardiovascular: Positive for palpitations. Negative for leg swelling.  Gastrointestinal: Negative for nausea and vomiting.  Genitourinary: Negative for dysuria.  Musculoskeletal: Negative for joint swelling.  Skin: Negative for rash.  Allergic/Immunologic: Negative.  Negative for environmental allergies, food allergies and immunocompromised state.  Neurological: Negative for headaches.  Hematological: Bruises/bleeds easily.  Psychiatric/Behavioral: Negative for dysphoric mood. The patient is nervous/anxious.       Objective:   Physical Exam Constitutional:      Appearance: Normal appearance.  HENT:     Head: Normocephalic and atraumatic.     Nose: No congestion.     Mouth/Throat:     Mouth: Mucous membranes are moist.     Comments: Mallampati 3, crowded oropharynx Eyes:     Extraocular Movements: Extraocular movements intact.     Pupils: Pupils are equal, round, and reactive to light.  Neck:     Musculoskeletal: Normal range of motion and neck supple. No neck rigidity or muscular tenderness.  Cardiovascular:     Rate and Rhythm: Normal rate and regular rhythm.     Pulses: Normal pulses.     Heart sounds: No murmur.  Pulmonary:     Effort: Pulmonary effort is normal. No respiratory distress.  Breath sounds: Normal breath sounds. No stridor. No wheezing or rhonchi.  Abdominal:     General: There is no distension.     Tenderness: There is no abdominal tenderness.  Musculoskeletal: Normal range of motion.        General: No swelling, tenderness or deformity.  Skin:    General: Skin is warm.     Coloration: Skin is not jaundiced or pale.  Neurological:     General: No focal deficit present.     Mental Status: She is alert and oriented to person, place, and time.     Cranial Nerves: No cranial nerve deficit.  Psychiatric:         Mood and Affect: Mood normal.    BP 130/62 (BP Location: Right Arm, Cuff Size: Normal)   Pulse 84   Temp 98.1 F (36.7 C) (Oral)   Ht 5\' 5"  (1.651 m)   Wt 213 lb (96.6 kg)   SpO2 98%   BMI 35.45 kg/m   Results of the Epworth flowsheet 03/24/2019  Sitting and reading 0  Watching TV 2  Sitting, inactive in a public place (e.g. a theatre or a meeting) 0  As a passenger in a car for an hour without a break 0  Lying down to rest in the afternoon when circumstances permit 2  Sitting and talking to someone 0  Sitting quietly after a lunch without alcohol 0  In a car, while stopped for a few minutes in traffic 0  Total score 4        Assessment & Plan:  .  Nonrestorative sleep .  Frequent awakenings .  Moderate probability of significant obstructive sleep apnea .  Heavy snoring at night .  Obesity  Plan: .  We will schedule the patient for home sleep study .  Importance of exercise and weight management discussed .  Treatment options for sleep disordered breathing discussed .  Pathophysiology of sleep disordered breathing discussed  We will see the patient back in the office in about 3 months .  Encouraged to call with any significant concerns

## 2019-04-11 ENCOUNTER — Ambulatory Visit (INDEPENDENT_AMBULATORY_CARE_PROVIDER_SITE_OTHER): Payer: BC Managed Care – PPO

## 2019-04-11 ENCOUNTER — Other Ambulatory Visit: Payer: Self-pay

## 2019-04-11 DIAGNOSIS — E538 Deficiency of other specified B group vitamins: Secondary | ICD-10-CM | POA: Diagnosis not present

## 2019-04-11 MED ORDER — CYANOCOBALAMIN 1000 MCG/ML IJ SOLN
1000.0000 ug | Freq: Once | INTRAMUSCULAR | Status: AC
  Administered 2019-04-11: 1000 ug via INTRAMUSCULAR

## 2019-04-11 NOTE — Progress Notes (Signed)
Medical screening examination/treatment/procedure(s) were performed by non-physician practitioner and as supervising physician I was immediately available for consultation/collaboration. I agree with above. Adib Wahba, MD   

## 2019-04-12 ENCOUNTER — Ambulatory Visit (INDEPENDENT_AMBULATORY_CARE_PROVIDER_SITE_OTHER): Payer: BC Managed Care – PPO | Admitting: Internal Medicine

## 2019-04-12 ENCOUNTER — Encounter: Payer: Self-pay | Admitting: Internal Medicine

## 2019-04-12 ENCOUNTER — Ambulatory Visit (INDEPENDENT_AMBULATORY_CARE_PROVIDER_SITE_OTHER)
Admission: RE | Admit: 2019-04-12 | Discharge: 2019-04-12 | Disposition: A | Discharge status: Home / Self Care | Payer: BC Managed Care – PPO | Source: Ambulatory Visit | Attending: Internal Medicine | Admitting: Internal Medicine

## 2019-04-12 VITALS — BP 124/82 | HR 76 | Temp 98.2°F | Ht 65.0 in | Wt 212.0 lb

## 2019-04-12 DIAGNOSIS — R072 Precordial pain: Secondary | ICD-10-CM | POA: Diagnosis not present

## 2019-04-12 DIAGNOSIS — F419 Anxiety disorder, unspecified: Secondary | ICD-10-CM

## 2019-04-12 DIAGNOSIS — R739 Hyperglycemia, unspecified: Secondary | ICD-10-CM

## 2019-04-12 DIAGNOSIS — R079 Chest pain, unspecified: Secondary | ICD-10-CM | POA: Diagnosis not present

## 2019-04-12 MED ORDER — ALPRAZOLAM 1 MG PO TABS: ORAL_TABLET | ORAL | 2 refills | Status: DC

## 2019-04-12 NOTE — Addendum Note (Signed)
Addended by: Juliet Rude on: 04/12/2019 10:31 AM   Modules accepted: Orders

## 2019-04-12 NOTE — Assessment & Plan Note (Signed)
stable overall by history and exam, recent data reviewed with pt, and pt to continue medical treatment as before,  to f/u any worsening symptoms or concerns  

## 2019-04-12 NOTE — Patient Instructions (Signed)
Your EKG was Ok today  Please continue all other medications as before, and refills have been done if requested - the xanax  Please have the pharmacy call with any other refills you may need.  Please continue your efforts at being more active, low cholesterol diet, and weight control.  Please keep your appointments with your specialists as you may have planned  Please go to the XRAY Department in the Basement (go straight as you get off the elevator) for the x-ray testing  You will be contacted regarding the referral for: stress testing  We can hold on more lab testing today

## 2019-04-12 NOTE — Assessment & Plan Note (Addendum)
Atypical, ecg reviewed, cant r/o cardiac so for cxr, and stress testing, cont same tx for now  Note:  Total time for pt hx, exam, review of record with pt in the room, determination of diagnoses and plan for further eval and tx is > 40 min, with over 50% spent in coordination and counseling of patient including the differential dx, tx, further evaluation and other management of chest pain, hyperglycemia, anxiety

## 2019-04-12 NOTE — Progress Notes (Addendum)
Subjective:    Patient ID: Pamela Gibson, female    DOB: 05-03-1966, 53 y.o.   MRN: [id number]  HPI  Here with 4 wks about 1 time per wk episode of SSCP feels like something turning in there, aching, pressure weight like, lasts < 30 min, not assoc with sob, diaphoresis,  N/v (though has other n/v), palpitations (though has had in the past) and denies dizziness or syncope. Not helped with TUMS, had some radiation to the neck (no left arm);  No fever, cough, wheezing.  Denies worsening reflux, abd pain, dysphagia, n/v, bowel change or blood.  No recent cxr, stress test, ecg.  No prior of CAD, but does endorse hx told she had mitral valve prolapse tx with lopressor for a time. Pt denies new neurological symptoms such as new headache, or facial or extremity weakness or numbness.  Pt denies polydipsia, polyuria, Denies worsening depressive symptoms, suicidal ideation, or panic; has ongoing anxiety, asks for xanax refill    Past Medical History:  Diagnosis Date  . Anxiety   . BACK PAIN 07/23/2007  . CHEST PAIN 09/04/2010  . Cyst of right ovary 07/03/2015  . DEGENERATIVE JOINT DISEASE, LUMBAR SPINE 07/23/2007  . Depression   . Gastric polyp    x3  . GERD 07/22/2007  . H/O total hysterectomy   . HYPERLIPIDEMIA 07/22/2007  . IBS 07/23/2007  . MITRAL VALVE PROLAPSE 07/22/2007  . Pinched nerve    LEFT SIDE, DISC 3,4,5  . Pneumonia    YEARS AGO  . SINUSITIS- ACUTE-NOS 09/09/2007  . TACHYCARDIA 08/20/2010  . Tuberculosis     MOTHER HAD YEARS AGO  . Unspecified Vaginitis and Vulvovaginitis 12/17/2007   Past Surgical History:  Procedure Laterality Date  . ABDOMINAL HYSTERECTOMY    . CHOLECYSTECTOMY    . CYSTOSCOPY  11/11/2017   Procedure: CYSTOSCOPY;  Surgeon: Janyth Pupa, DO;  Location: [facility];  Service: Gynecology;;  . LAPAROSCOPIC BILATERAL SALPINGO OOPHERECTOMY Bilateral 11/11/2017   Procedure: LAPAROSCOPIC BILATERAL SALPINGO OOPHORECTOMY;  Surgeon: Janyth Pupa, DO;  Location: [facility];   Service: Gynecology;  Laterality: Bilateral;  . RLE tib/fib fx/surgury    . TONSILLECTOMY    . TUBAL LIGATION      reports that she has never smoked. She has never used smokeless tobacco. She reports that she does not drink alcohol or use drugs. family history includes Angina in her mother; Colon polyps in her mother; Coronary artery disease in her mother; Diabetes in an other family member; Heart disease in her mother; Hyperlipidemia in her unknown relative; Hypertension in an other family member; Stroke in her mother. Allergies  Allergen Reactions  . Atorvastatin     REACTION: more forgetful  . Ibuprofen     INSTRUCTED BY MD NOT TO TAKE DUE TO GI UPSET  . Penicillins     turns blue, childhood allergy Has patient had a PCN reaction causing immediate rash, facial/tongue/throat swelling, SOB or lightheadedness with hypotension: Yes Has patient had a PCN reaction causing severe rash involving mucus membranes or skin necrosis: No Has patient had a PCN reaction that required hospitalization: Unknown  Has patient had a PCN reaction occurring within the last 10 years: No If all of the above answers are "NO", then may proceed with Cephalosporin use.   . Simvastatin     REACTION: more forgetful  . Tetracycline     REACTION: itch   Review of Systems  Constitutional: Negative for other unusual diaphoresis or sweats HENT: Negative for ear discharge  or swelling Eyes: Negative for other worsening visual disturbances Respiratory: Negative for stridor or other swelling  Gastrointestinal: Negative for worsening distension or other blood Genitourinary: Negative for retention or other urinary change Musculoskeletal: Negative for other MSK pain or swelling Skin: Negative for color change or other new lesions Neurological: Negative for worsening tremors and other numbness  Psychiatric/Behavioral: Negative for worsening agitation or other fatigue All other system neg per pt    Objective:    Physical Exam BP 124/82   Pulse 76   Temp 98.2 F (36.8 C) (Oral)   Ht 5\' 5"  (1.651 m)   Wt 212 lb (96.2 kg)   SpO2 98%   BMI 35.28 kg/m  VS noted,  Constitutional: Pt appears in NAD HENT: Head: NCAT.  Right Ear: External ear normal.  Left Ear: External ear normal.  Eyes: . Pupils are equal, round, and reactive to light. Conjunctivae and EOM are normal Nose: without d/c or deformity Neck: Neck supple. Gross normal ROM Cardiovascular: Normal rate and regular rhythm.   Pulmonary/Chest: Effort normal and breath sounds without rales or wheezing.  Abd:  Soft, NT, ND, + BS, no organomegaly Neurological: Pt is alert. At baseline orientation, motor grossly intact Skin: Skin is warm. No rashes, other new lesions, no LE edema Psychiatric: Pt behavior is normal without agitation  No other exam findings Lab Results  Component Value Date   WBC 7.3 02/08/2019   HGB 15.6 (H) 02/08/2019   HCT 46.3 (H) 02/08/2019   PLT 261.0 02/08/2019   GLUCOSE 87 02/08/2019   CHOL 174 02/08/2019   TRIG 117.0 02/08/2019   HDL 51.10 02/08/2019   LDLDIRECT 128.8 07/18/2008   LDLCALC 99 02/08/2019   ALT 19 02/08/2019   AST 16 02/08/2019   NA 140 02/08/2019   K 3.8 02/08/2019   CL 101 02/08/2019   CREATININE 0.86 02/08/2019   BUN 15 02/08/2019   CO2 28 02/08/2019   TSH 1.81 02/08/2019   HGBA1C 5.7 02/08/2019   ECG today I have personally interpreted - NSR 67     Assessment & Plan:

## 2019-04-13 DIAGNOSIS — M5416 Radiculopathy, lumbar region: Secondary | ICD-10-CM | POA: Diagnosis not present

## 2019-04-13 DIAGNOSIS — M47816 Spondylosis without myelopathy or radiculopathy, lumbar region: Secondary | ICD-10-CM | POA: Diagnosis not present

## 2019-04-13 DIAGNOSIS — G894 Chronic pain syndrome: Secondary | ICD-10-CM | POA: Diagnosis not present

## 2019-04-13 DIAGNOSIS — M5136 Other intervertebral disc degeneration, lumbar region: Secondary | ICD-10-CM | POA: Diagnosis not present

## 2019-04-14 ENCOUNTER — Encounter (HOSPITAL_COMMUNITY): Payer: Self-pay | Admitting: Internal Medicine

## 2019-04-15 ENCOUNTER — Other Ambulatory Visit: Payer: Self-pay

## 2019-04-15 ENCOUNTER — Ambulatory Visit
Admission: RE | Admit: 2019-04-15 | Discharge: 2019-04-15 | Disposition: A | Discharge status: Home / Self Care | Payer: BC Managed Care – PPO | Source: Ambulatory Visit | Attending: Obstetrics & Gynecology | Admitting: Obstetrics & Gynecology

## 2019-04-15 DIAGNOSIS — Z1231 Encounter for screening mammogram for malignant neoplasm of breast: Secondary | ICD-10-CM | POA: Diagnosis not present

## 2019-04-20 ENCOUNTER — Ambulatory Visit: Payer: BC Managed Care – PPO

## 2019-04-20 DIAGNOSIS — G4733 Obstructive sleep apnea (adult) (pediatric): Secondary | ICD-10-CM

## 2019-04-21 ENCOUNTER — Telehealth (HOSPITAL_COMMUNITY): Payer: Self-pay

## 2019-04-21 NOTE — Telephone Encounter (Signed)
Encounter complete. 

## 2019-04-26 ENCOUNTER — Other Ambulatory Visit: Payer: Self-pay

## 2019-04-26 ENCOUNTER — Telehealth: Payer: Self-pay

## 2019-04-26 ENCOUNTER — Other Ambulatory Visit: Payer: Self-pay | Admitting: Internal Medicine

## 2019-04-26 ENCOUNTER — Ambulatory Visit (HOSPITAL_COMMUNITY)
Admission: RE | Admit: 2019-04-26 | Discharge: 2019-04-26 | Disposition: A | Discharge status: Home / Self Care | Payer: BC Managed Care – PPO | Source: Ambulatory Visit | Attending: Internal Medicine | Admitting: Internal Medicine

## 2019-04-26 DIAGNOSIS — G4733 Obstructive sleep apnea (adult) (pediatric): Secondary | ICD-10-CM | POA: Diagnosis not present

## 2019-04-26 DIAGNOSIS — R079 Chest pain, unspecified: Secondary | ICD-10-CM | POA: Insufficient documentation

## 2019-04-26 LAB — MYOCARDIAL PERFUSION IMAGING
LV dias vol: 76 mL (ref 46–106)
LV sys vol: 26 mL
Peak HR: 104 {beats}/min
Rest HR: 56 {beats}/min
SDS: 2
SRS: 1
SSS: 3
TID: 1.13

## 2019-04-26 MED ORDER — REGADENOSON 0.4 MG/5ML IV SOLN
0.4000 mg | Freq: Once | INTRAVENOUS | Status: AC
  Administered 2019-04-26: 0.4 mg via INTRAVENOUS

## 2019-04-26 MED ORDER — TECHNETIUM TC 99M TETROFOSMIN IV KIT
10.4000 | PACK | Freq: Once | INTRAVENOUS | Status: AC | PRN
  Administered 2019-04-26: 10.4 via INTRAVENOUS
  Filled 2019-04-26: qty 11

## 2019-04-26 MED ORDER — TECHNETIUM TC 99M TETROFOSMIN IV KIT
31.1000 | PACK | Freq: Once | INTRAVENOUS | Status: AC | PRN
  Administered 2019-04-26: 31.1 via INTRAVENOUS
  Filled 2019-04-26: qty 32

## 2019-04-26 NOTE — Telephone Encounter (Signed)
Erica please put sleep results in message that way when  she calls back if you are busy we can relay the message. Thanks.

## 2019-04-26 NOTE — Telephone Encounter (Signed)
LMTCB x1 for sleep study results.

## 2019-04-26 NOTE — Telephone Encounter (Signed)
I called and spoke with the patient and made her aware that her Sleep study was negative and did not show any sleep apnea. I gave her recommendations for how to sleep to try and help per Dr. Ander Slade. She verbalized understanding and did not have any questions. Nothing further is needed.

## 2019-04-26 NOTE — Telephone Encounter (Signed)
Pt returning call for results and can be reached @ [phone] pt said that it would be ok to leave msg.Pamela Gibson

## 2019-05-11 DIAGNOSIS — M5416 Radiculopathy, lumbar region: Secondary | ICD-10-CM | POA: Diagnosis not present

## 2019-05-11 DIAGNOSIS — M5136 Other intervertebral disc degeneration, lumbar region: Secondary | ICD-10-CM | POA: Diagnosis not present

## 2019-05-11 DIAGNOSIS — M47816 Spondylosis without myelopathy or radiculopathy, lumbar region: Secondary | ICD-10-CM | POA: Diagnosis not present

## 2019-05-11 DIAGNOSIS — G894 Chronic pain syndrome: Secondary | ICD-10-CM | POA: Diagnosis not present

## 2019-05-12 ENCOUNTER — Other Ambulatory Visit: Payer: Self-pay

## 2019-05-12 ENCOUNTER — Ambulatory Visit (INDEPENDENT_AMBULATORY_CARE_PROVIDER_SITE_OTHER): Payer: BC Managed Care – PPO

## 2019-05-12 DIAGNOSIS — E538 Deficiency of other specified B group vitamins: Secondary | ICD-10-CM | POA: Diagnosis not present

## 2019-05-12 MED ORDER — CYANOCOBALAMIN 1000 MCG/ML IJ SOLN
1000.0000 ug | Freq: Once | INTRAMUSCULAR | Status: AC
  Administered 2019-05-12: 1000 ug via INTRAMUSCULAR

## 2019-05-12 NOTE — Progress Notes (Signed)
Medical screening examination/treatment/procedure(s) were performed by non-physician practitioner and as supervising physician I was immediately available for consultation/collaboration. I agree with above. Chantal Worthey, MD   

## 2019-05-16 DIAGNOSIS — R1013 Epigastric pain: Secondary | ICD-10-CM | POA: Diagnosis not present

## 2019-05-16 DIAGNOSIS — R1310 Dysphagia, unspecified: Secondary | ICD-10-CM | POA: Diagnosis not present

## 2019-05-16 DIAGNOSIS — K2270 Barrett's esophagus without dysplasia: Secondary | ICD-10-CM | POA: Diagnosis not present

## 2019-06-08 DIAGNOSIS — M5136 Other intervertebral disc degeneration, lumbar region: Secondary | ICD-10-CM | POA: Diagnosis not present

## 2019-06-08 DIAGNOSIS — G894 Chronic pain syndrome: Secondary | ICD-10-CM | POA: Diagnosis not present

## 2019-06-08 DIAGNOSIS — M47816 Spondylosis without myelopathy or radiculopathy, lumbar region: Secondary | ICD-10-CM | POA: Diagnosis not present

## 2019-06-08 DIAGNOSIS — M5416 Radiculopathy, lumbar region: Secondary | ICD-10-CM | POA: Diagnosis not present

## 2019-06-08 DIAGNOSIS — Z79891 Long term (current) use of opiate analgesic: Secondary | ICD-10-CM | POA: Diagnosis not present

## 2019-06-09 DIAGNOSIS — R1013 Epigastric pain: Secondary | ICD-10-CM | POA: Diagnosis not present

## 2019-06-09 DIAGNOSIS — Z01818 Encounter for other preprocedural examination: Secondary | ICD-10-CM | POA: Diagnosis not present

## 2019-06-09 DIAGNOSIS — R1310 Dysphagia, unspecified: Secondary | ICD-10-CM | POA: Diagnosis not present

## 2019-06-13 ENCOUNTER — Other Ambulatory Visit: Payer: Self-pay

## 2019-06-13 ENCOUNTER — Ambulatory Visit (INDEPENDENT_AMBULATORY_CARE_PROVIDER_SITE_OTHER): Payer: BC Managed Care – PPO

## 2019-06-13 DIAGNOSIS — E538 Deficiency of other specified B group vitamins: Secondary | ICD-10-CM | POA: Diagnosis not present

## 2019-06-13 MED ORDER — CYANOCOBALAMIN 1000 MCG/ML IJ SOLN
1000.0000 ug | Freq: Once | INTRAMUSCULAR | Status: AC
  Administered 2019-06-13: 1000 ug via INTRAMUSCULAR

## 2019-06-13 NOTE — Progress Notes (Signed)
Medical screening examination/treatment/procedure(s) were performed by non-physician practitioner and as supervising physician I was immediately available for consultation/collaboration. I agree with above. Savahna Casados, MD   

## 2019-06-16 DIAGNOSIS — K900 Celiac disease: Secondary | ICD-10-CM | POA: Diagnosis not present

## 2019-06-16 DIAGNOSIS — K2970 Gastritis, unspecified, without bleeding: Secondary | ICD-10-CM | POA: Diagnosis not present

## 2019-06-16 DIAGNOSIS — K5281 Eosinophilic gastritis or gastroenteritis: Secondary | ICD-10-CM | POA: Diagnosis not present

## 2019-06-16 DIAGNOSIS — A048 Other specified bacterial intestinal infections: Secondary | ICD-10-CM | POA: Diagnosis not present

## 2019-06-17 DIAGNOSIS — K200 Eosinophilic esophagitis: Secondary | ICD-10-CM | POA: Diagnosis not present

## 2019-06-17 DIAGNOSIS — K2270 Barrett's esophagus without dysplasia: Secondary | ICD-10-CM | POA: Diagnosis not present

## 2019-07-05 DIAGNOSIS — K219 Gastro-esophageal reflux disease without esophagitis: Secondary | ICD-10-CM | POA: Diagnosis not present

## 2019-07-05 DIAGNOSIS — R1310 Dysphagia, unspecified: Secondary | ICD-10-CM | POA: Diagnosis not present

## 2019-07-05 DIAGNOSIS — Z8719 Personal history of other diseases of the digestive system: Secondary | ICD-10-CM | POA: Diagnosis not present

## 2019-07-07 DIAGNOSIS — M5416 Radiculopathy, lumbar region: Secondary | ICD-10-CM | POA: Diagnosis not present

## 2019-07-07 DIAGNOSIS — M47816 Spondylosis without myelopathy or radiculopathy, lumbar region: Secondary | ICD-10-CM | POA: Diagnosis not present

## 2019-07-07 DIAGNOSIS — G894 Chronic pain syndrome: Secondary | ICD-10-CM | POA: Diagnosis not present

## 2019-07-07 DIAGNOSIS — M5136 Other intervertebral disc degeneration, lumbar region: Secondary | ICD-10-CM | POA: Diagnosis not present

## 2019-07-13 ENCOUNTER — Other Ambulatory Visit: Payer: Self-pay

## 2019-07-13 ENCOUNTER — Ambulatory Visit (INDEPENDENT_AMBULATORY_CARE_PROVIDER_SITE_OTHER): Payer: BC Managed Care – PPO

## 2019-07-13 DIAGNOSIS — E538 Deficiency of other specified B group vitamins: Secondary | ICD-10-CM

## 2019-07-13 MED ORDER — CYANOCOBALAMIN 1000 MCG/ML IJ SOLN
1000.0000 ug | Freq: Once | INTRAMUSCULAR | Status: AC
  Administered 2019-07-13: 1000 ug via INTRAMUSCULAR

## 2019-07-13 NOTE — Progress Notes (Signed)
Medical screening examination/treatment/procedure(s) were performed by non-physician practitioner and as supervising physician I was immediately available for consultation/collaboration. I agree with above. Twinkle Sockwell, MD   

## 2019-07-22 ENCOUNTER — Other Ambulatory Visit: Payer: Self-pay | Admitting: Internal Medicine

## 2019-07-25 NOTE — Telephone Encounter (Signed)
Done erx 

## 2019-08-04 DIAGNOSIS — G894 Chronic pain syndrome: Secondary | ICD-10-CM | POA: Diagnosis not present

## 2019-08-04 DIAGNOSIS — M5136 Other intervertebral disc degeneration, lumbar region: Secondary | ICD-10-CM | POA: Diagnosis not present

## 2019-08-04 DIAGNOSIS — M5416 Radiculopathy, lumbar region: Secondary | ICD-10-CM | POA: Diagnosis not present

## 2019-08-04 DIAGNOSIS — M47816 Spondylosis without myelopathy or radiculopathy, lumbar region: Secondary | ICD-10-CM | POA: Diagnosis not present

## 2019-08-10 ENCOUNTER — Other Ambulatory Visit (INDEPENDENT_AMBULATORY_CARE_PROVIDER_SITE_OTHER): Payer: BC Managed Care – PPO

## 2019-08-10 ENCOUNTER — Ambulatory Visit (INDEPENDENT_AMBULATORY_CARE_PROVIDER_SITE_OTHER): Payer: BC Managed Care – PPO | Admitting: Internal Medicine

## 2019-08-10 ENCOUNTER — Encounter: Payer: Self-pay | Admitting: Internal Medicine

## 2019-08-10 ENCOUNTER — Other Ambulatory Visit: Payer: Self-pay

## 2019-08-10 VITALS — BP 124/80 | HR 59 | Temp 97.9°F | Ht 65.0 in | Wt 214.0 lb

## 2019-08-10 DIAGNOSIS — Z0000 Encounter for general adult medical examination without abnormal findings: Secondary | ICD-10-CM | POA: Diagnosis not present

## 2019-08-10 DIAGNOSIS — E785 Hyperlipidemia, unspecified: Secondary | ICD-10-CM | POA: Diagnosis not present

## 2019-08-10 DIAGNOSIS — R739 Hyperglycemia, unspecified: Secondary | ICD-10-CM

## 2019-08-10 DIAGNOSIS — G4710 Hypersomnia, unspecified: Secondary | ICD-10-CM

## 2019-08-10 DIAGNOSIS — E538 Deficiency of other specified B group vitamins: Secondary | ICD-10-CM | POA: Diagnosis not present

## 2019-08-10 DIAGNOSIS — E559 Vitamin D deficiency, unspecified: Secondary | ICD-10-CM

## 2019-08-10 LAB — BASIC METABOLIC PANEL
BUN: 13 mg/dL (ref 6–23)
CO2: 29 mEq/L (ref 19–32)
Calcium: 9.7 mg/dL (ref 8.4–10.5)
Chloride: 102 mEq/L (ref 96–112)
Creatinine, Ser: 0.87 mg/dL (ref 0.40–1.20)
GFR: 67.8 mL/min (ref >60.00)
Glucose, Bld: 95 mg/dL (ref 70–99)
Potassium: 3.6 mEq/L (ref 3.5–5.1)
Sodium: 139 mEq/L (ref 135–145)

## 2019-08-10 LAB — CBC WITH DIFFERENTIAL/PLATELET
Basophils Absolute: 0 10*3/uL (ref 0.0–0.1)
Basophils Relative: 0.6 % (ref 0.0–3.0)
Eosinophils Absolute: 0.2 10*3/uL (ref 0.0–0.7)
Eosinophils Relative: 3 % (ref 0.0–5.0)
HCT: 44.9 % (ref 36.0–46.0)
Hemoglobin: 15.2 g/dL — ABNORMAL HIGH (ref 12.0–15.0)
Lymphocytes Relative: 36.8 % (ref 12.0–46.0)
Lymphs Abs: 3.1 10*3/uL (ref 0.7–4.0)
MCHC: 33.8 g/dL (ref 30.0–36.0)
MCV: 88.9 fl (ref 78.0–100.0)
Monocytes Absolute: 0.7 10*3/uL (ref 0.1–1.0)
Monocytes Relative: 8.5 % (ref 3.0–12.0)
Neutro Abs: 4.3 10*3/uL (ref 1.4–7.7)
Neutrophils Relative %: 51.1 % (ref 43.0–77.0)
Platelets: 264 10*3/uL (ref 150.0–400.0)
RBC: 5.05 Mil/uL (ref 3.87–5.11)
RDW: 12.4 % (ref 11.5–15.5)
WBC: 8.3 10*3/uL (ref 4.0–10.5)

## 2019-08-10 LAB — HEPATIC FUNCTION PANEL
ALT: 22 U/L (ref 0–35)
AST: 20 U/L (ref 0–37)
Albumin: 4.4 g/dL (ref 3.5–5.2)
Alkaline Phosphatase: 56 U/L (ref 39–117)
Bilirubin, Direct: 0.1 mg/dL (ref 0.0–0.3)
Total Bilirubin: 0.7 mg/dL (ref 0.2–1.2)
Total Protein: 7.4 g/dL (ref 6.0–8.3)

## 2019-08-10 LAB — URINALYSIS, ROUTINE W REFLEX MICROSCOPIC
Bilirubin Urine: NEGATIVE
Ketones, ur: NEGATIVE
Leukocytes,Ua: NEGATIVE
Nitrite: NEGATIVE
Specific Gravity, Urine: 1.01 (ref 1.000–1.030)
Total Protein, Urine: NEGATIVE
Urine Glucose: NEGATIVE
Urobilinogen, UA: 1 (ref 0.0–1.0)
WBC, UA: NONE SEEN (ref 0–?)
pH: 6 (ref 5.0–8.0)

## 2019-08-10 LAB — MICROALBUMIN / CREATININE URINE RATIO
Creatinine,U: 70 mg/dL
Microalb Creat Ratio: 1 mg/g (ref 0.0–30.0)
Microalb, Ur: <0.7 mg/dL (ref 0.0–1.9)

## 2019-08-10 LAB — LIPID PANEL
Cholesterol: 164 mg/dL (ref 0–200)
HDL: 48.1 mg/dL (ref >39.00)
LDL Cholesterol: 88 mg/dL (ref 0–99)
NonHDL: 115.45
Total CHOL/HDL Ratio: 3
Triglycerides: 138 mg/dL (ref 0.0–149.0)
VLDL: 27.6 mg/dL (ref 0.0–40.0)

## 2019-08-10 LAB — TSH: TSH: 1.92 u[IU]/mL (ref 0.35–4.50)

## 2019-08-10 LAB — VITAMIN B12: Vitamin B-12: >1500 pg/mL — ABNORMAL HIGH (ref 211–911)

## 2019-08-10 LAB — HEMOGLOBIN A1C: Hgb A1c MFr Bld: 5.5 % (ref 4.6–6.5)

## 2019-08-10 LAB — VITAMIN D 25 HYDROXY (VIT D DEFICIENCY, FRACTURES): VITD: 41.7 ng/mL (ref 30.00–100.00)

## 2019-08-10 MED ORDER — CYANOCOBALAMIN 1000 MCG/ML IJ SOLN
1000.0000 ug | Freq: Once | INTRAMUSCULAR | Status: AC
  Administered 2019-08-10: 09:00:00 1000 ug via INTRAMUSCULAR

## 2019-08-10 MED ORDER — VITAMIN B-12 1000 MCG PO TABS: 1000.0000 ug | ORAL_TABLET | Freq: Every day | ORAL | 3 refills | Status: AC

## 2019-08-10 NOTE — Assessment & Plan Note (Signed)
For vit b12 lab f/u, b12 1000 mg IM today, and ok to change to oral supplements

## 2019-08-10 NOTE — Assessment & Plan Note (Signed)
stable overall by history and exam, recent data reviewed with pt, and pt to continue medical treatment as before,  to f/u any worsening symptoms or concerns  

## 2019-08-10 NOTE — Assessment & Plan Note (Signed)
To continue vit d oral replacement, for f/u lab

## 2019-08-10 NOTE — Patient Instructions (Addendum)
You had the B12 shot today  Ok to change to the b12 PILLS  Please continue all other medications as before, and refills have been done if requested.  Please have the pharmacy call with any other refills you may need.  Please continue your efforts at being more active, low cholesterol diet, and weight control.  You are otherwise up to date with prevention measures today.  Please keep your appointments with your specialists as you may have planned  Please go to the LAB in the Basement (turn left off the elevator) for the tests to be done today  You will be contacted by phone if any changes need to be made immediately.  Otherwise, you will receive a letter about your results with an explanation, but please check with MyChart first.  Please remember to sign up for MyChart if you have not done so, as this will be important to you in the future with finding out test results, communicating by private email, and scheduling acute appointments online when needed.  Please return in 6 months, or sooner if needed, with Lab testing done 3-5 days before

## 2019-08-10 NOTE — Assessment & Plan Note (Signed)
No OSA per recent eval,  to f/u any worsening symptoms or concerns

## 2019-08-10 NOTE — Progress Notes (Signed)
Subjective:    Patient ID: Pamela Gibson, female    DOB: 04/03/1966, 53 y.o.   MRN: [id number]  HPI  Here to f/u; overall doing ok,  Pt denies chest pain, increasing sob or doe, wheezing, orthopnea, PND, increased LE swelling, palpitations, dizziness or syncope.  Pt denies new neurological symptoms such as new headache, or facial or extremity weakness or numbness.  Pt denies polydipsia, polyuria, or low sugar episode.  Pt states overall good compliance with meds, mostly trying to follow appropriate diet, with wt overall stable,  but little exercise however.  Due for B12 IM today Past Medical History:  Diagnosis Date  . Anxiety   . BACK PAIN 07/23/2007  . CHEST PAIN 09/04/2010  . Cyst of right ovary 07/03/2015  . DEGENERATIVE JOINT DISEASE, LUMBAR SPINE 07/23/2007  . Depression   . Gastric polyp    x3  . GERD 07/22/2007  . H/O total hysterectomy   . HYPERLIPIDEMIA 07/22/2007  . IBS 07/23/2007  . MITRAL VALVE PROLAPSE 07/22/2007  . Pinched nerve    LEFT SIDE, DISC 3,4,5  . Pneumonia    YEARS AGO  . SINUSITIS- ACUTE-NOS 09/09/2007  . TACHYCARDIA 08/20/2010  . Tuberculosis     MOTHER HAD YEARS AGO  . Unspecified Vaginitis and Vulvovaginitis 12/17/2007   Past Surgical History:  Procedure Laterality Date  . ABDOMINAL HYSTERECTOMY    . CHOLECYSTECTOMY    . CYSTOSCOPY  11/11/2017   Procedure: CYSTOSCOPY;  Surgeon: Janyth Pupa, DO;  Location: [facility];  Service: Gynecology;;  . LAPAROSCOPIC BILATERAL SALPINGO OOPHERECTOMY Bilateral 11/11/2017   Procedure: LAPAROSCOPIC BILATERAL SALPINGO OOPHORECTOMY;  Surgeon: Janyth Pupa, DO;  Location: [facility];  Service: Gynecology;  Laterality: Bilateral;  . RLE tib/fib fx/surgury    . TONSILLECTOMY    . TUBAL LIGATION      reports that she has never smoked. She has never used smokeless tobacco. She reports that she does not drink alcohol or use drugs. family history includes Angina in her mother; Colon polyps in her mother; Coronary artery  disease in her mother; Diabetes in an other family member; Heart disease in her mother; Hyperlipidemia in an other family member; Hypertension in an other family member; Stroke in her mother. Allergies  Allergen Reactions  . Atorvastatin     REACTION: more forgetful  . Ibuprofen     INSTRUCTED BY MD NOT TO TAKE DUE TO GI UPSET  . Penicillins     turns blue, childhood allergy Has patient had a PCN reaction causing immediate rash, facial/tongue/throat swelling, SOB or lightheadedness with hypotension: Yes Has patient had a PCN reaction causing severe rash involving mucus membranes or skin necrosis: No Has patient had a PCN reaction that required hospitalization: Unknown  Has patient had a PCN reaction occurring within the last 10 years: No If all of the above answers are "NO", then may proceed with Cephalosporin use.   . Simvastatin     REACTION: more forgetful  . Tetracycline     REACTION: itch   Current Outpatient Medications on File Prior to Visit  Medication Sig Dispense Refill  . ALPRAZolam (XANAX) 1 MG tablet 1 TAB BY MOUTH THREE TIMES DAILY AS NEEDED 90 tablet 2  . aspirin 81 MG EC tablet Take 81 mg by mouth daily.      . calcium carbonate (TUMS - DOSED IN MG ELEMENTAL CALCIUM) 500 MG chewable tablet Chew 1-2 tablets by mouth daily as needed for indigestion or heartburn.    . Dexlansoprazole (DEXILANT  PO) Take by mouth daily.    Marland Kitchen nystatin (MYCOSTATIN/NYSTOP) powder Use as directed topically to affected area daily as needed 60 g 2  . oxyCODONE-acetaminophen (PERCOCET) 10-325 MG tablet Take 1 tablet by mouth every 4 (four) hours as needed for pain.    . polyethylene glycol (MIRALAX / GLYCOLAX) packet Take 17 g by mouth daily as needed.    . pravastatin (PRAVACHOL) 40 MG tablet TAKE 1 TABLET BY MOUTH DAILY 90 tablet 3  . Vitamin D, Ergocalciferol, (DRISDOL) 1.25 MG (50000 UT) CAPS capsule Take 1 capsule (50,000 Units total) by mouth every 7 (seven) days. 12 capsule 0   No current  facility-administered medications on file prior to visit.   Review of Systems  Constitutional: Negative for other unusual diaphoresis or sweats HENT: Negative for ear discharge or swelling Eyes: Negative for other worsening visual disturbances Respiratory: Negative for stridor or other swelling  Gastrointestinal: Negative for worsening distension or other blood Genitourinary: Negative for retention or other urinary change Musculoskeletal: Negative for other MSK pain or swelling Skin: Negative for color change or other new lesions Neurological: Negative for worsening tremors and other numbness  Psychiatric/Behavioral: Negative for worsening agitation or other fatigue All otherwise neg per pt     Objective:   Physical Exam BP 124/80   Pulse (!) 59   Temp 97.9 F (36.6 C) (Oral)   Ht 5\' 5"  (1.651 m)   Wt 214 lb (97.1 kg)   SpO2 98%   BMI 35.61 kg/m  VS noted,  Constitutional: Pt appears in NAD HENT: Head: NCAT.  Right Ear: External ear normal.  Left Ear: External ear normal.  Eyes: . Pupils are equal, round, and reactive to light. Conjunctivae and EOM are normal Nose: without d/c or deformity Neck: Neck supple. Gross normal ROM Cardiovascular: Normal rate and regular rhythm.   Pulmonary/Chest: Effort normal and breath sounds without rales or wheezing.  Abd:  Soft, NT, ND, + BS, no organomegaly Neurological: Pt is alert. At baseline orientation, motor grossly intact Skin: Skin is warm. No rashes, other new lesions, no LE edema Psychiatric: Pt behavior is normal without agitation  All otherwise neg per pt Lab Results  Component Value Date   WBC 8.3 08/10/2019   HGB 15.2 (H) 08/10/2019   HCT 44.9 08/10/2019   PLT 264.0 08/10/2019   GLUCOSE 95 08/10/2019   CHOL 164 08/10/2019   TRIG 138.0 08/10/2019   HDL 48.10 08/10/2019   LDLDIRECT 128.8 07/18/2008   LDLCALC 88 08/10/2019   ALT 22 08/10/2019   AST 20 08/10/2019   NA 139 08/10/2019   K 3.6 08/10/2019   CL 102  08/10/2019   CREATININE 0.87 08/10/2019   BUN 13 08/10/2019   CO2 29 08/10/2019   TSH 1.92 08/10/2019   HGBA1C 5.5 08/10/2019   MICROALBUR <0.7 08/10/2019          Assessment & Plan:

## 2019-10-27 ENCOUNTER — Other Ambulatory Visit: Payer: Self-pay | Admitting: Internal Medicine

## 2019-10-27 NOTE — Telephone Encounter (Signed)
Done erx 

## 2019-11-10 IMAGING — MG DIGITAL SCREENING BILATERAL MAMMOGRAM WITH TOMO AND CAD
6 of 10 series · 6 of 30 positions shown · non-contrast
Comparison: Previous exam(s).

CLINICAL DATA: Screening.

EXAM:
DIGITAL SCREENING BILATERAL MAMMOGRAM WITH TOMO AND CAD

[R MLO synth-2D]
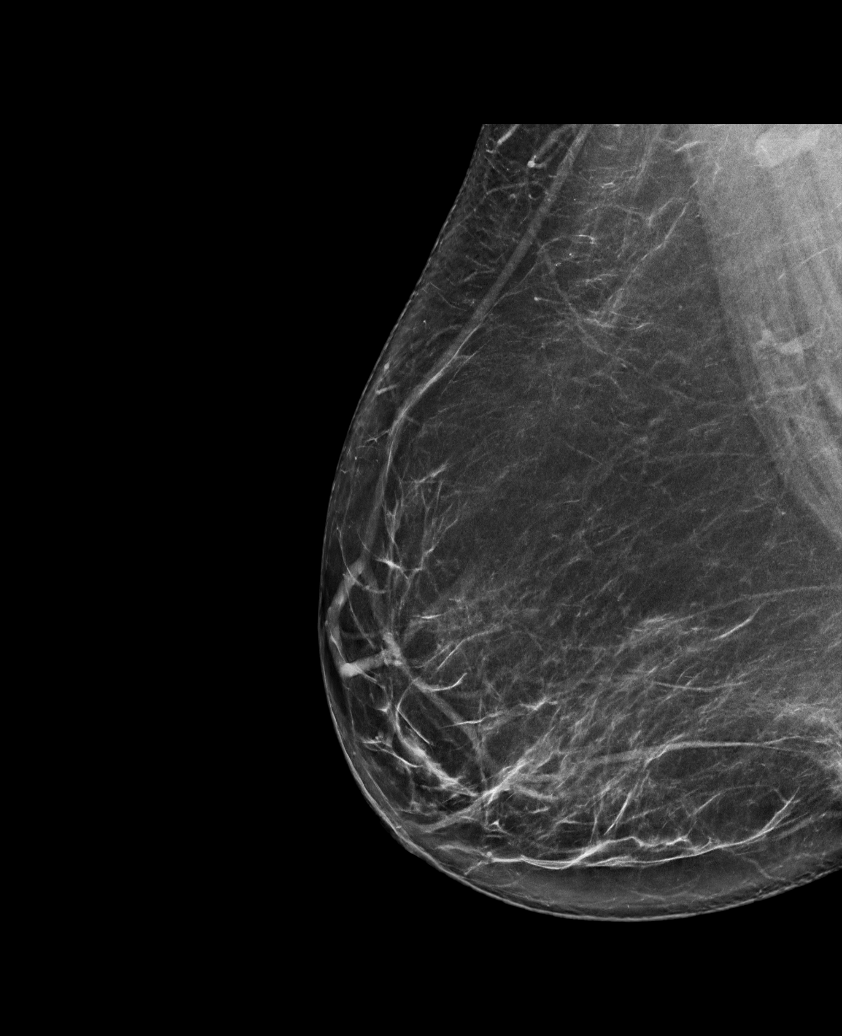

[L CC synth-2D (1 of 2)]
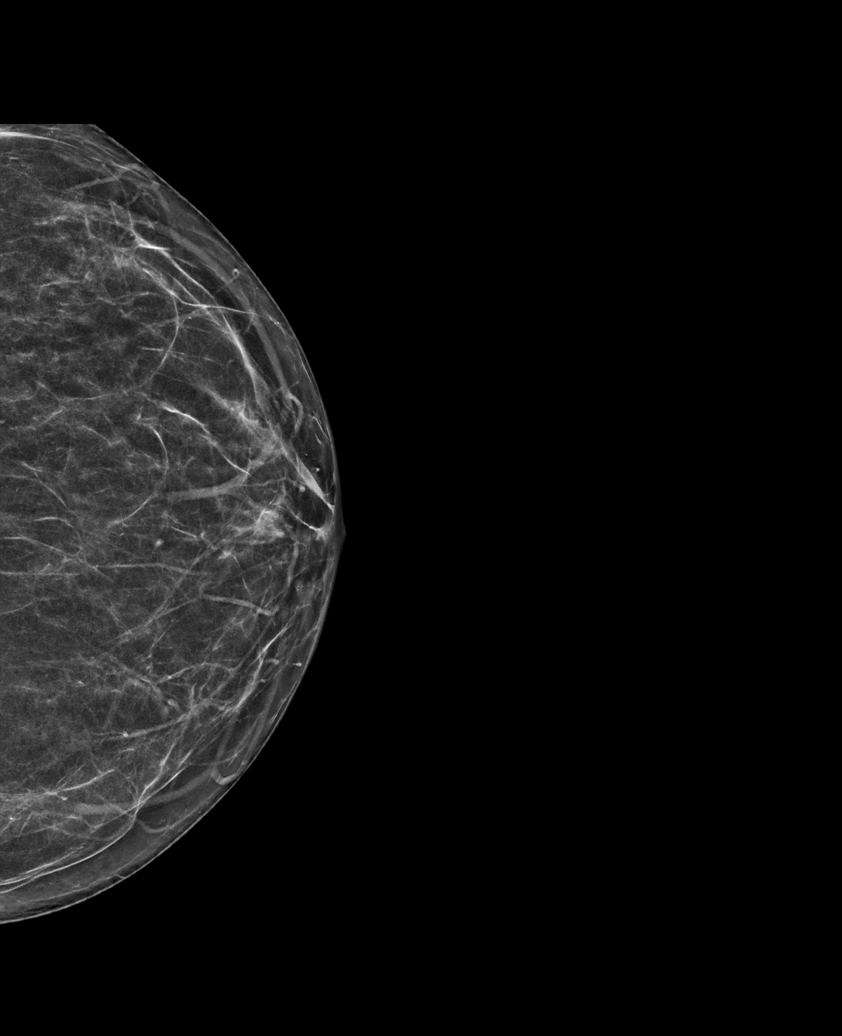

[L CC synth-2D (2 of 2)]
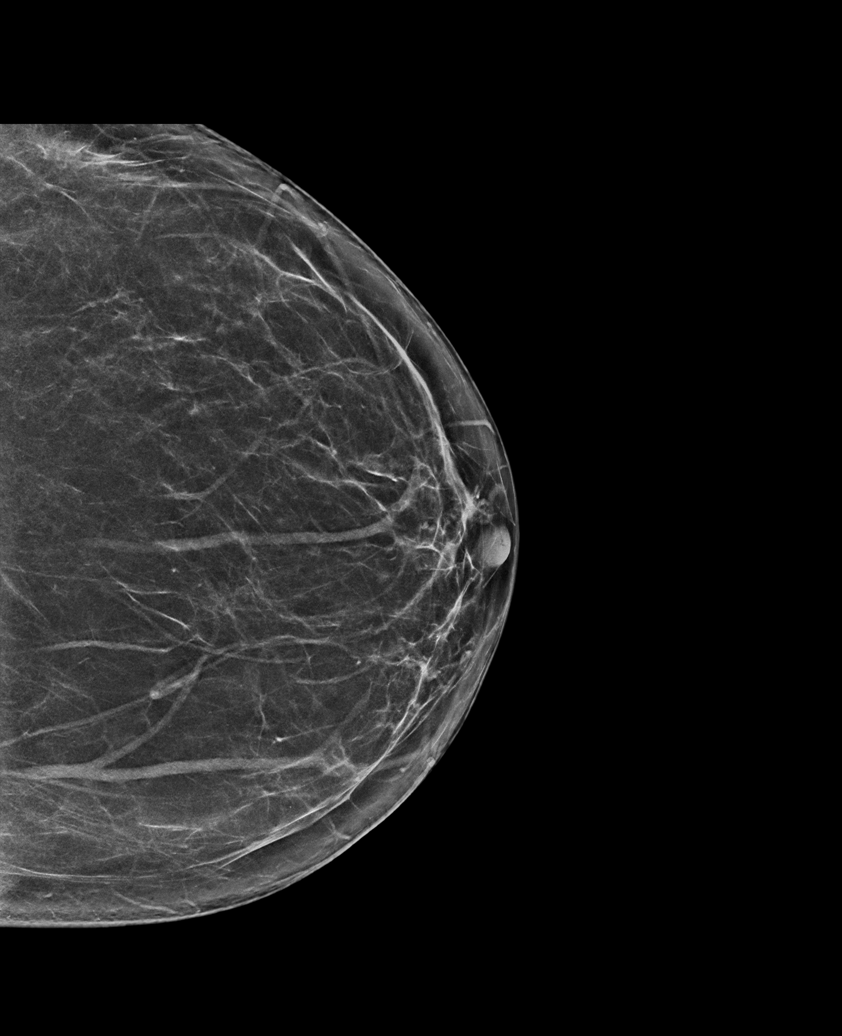

[L MLO synth-2D]
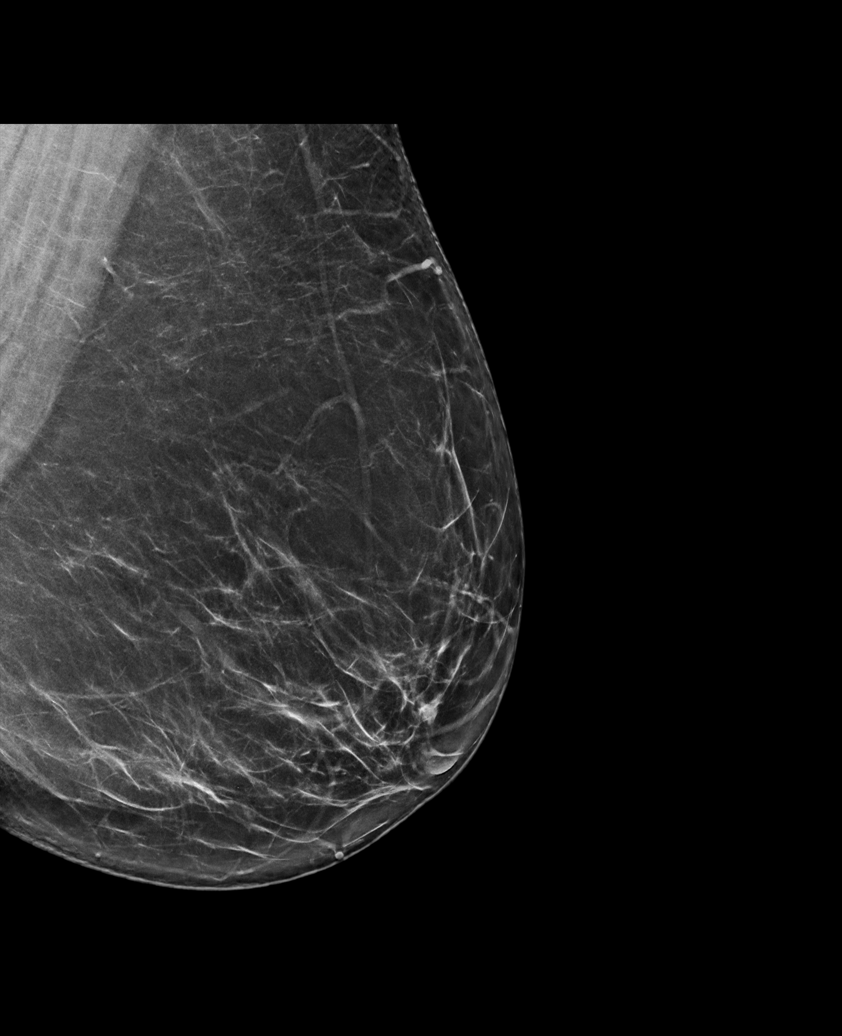

[R CC synth-2D]
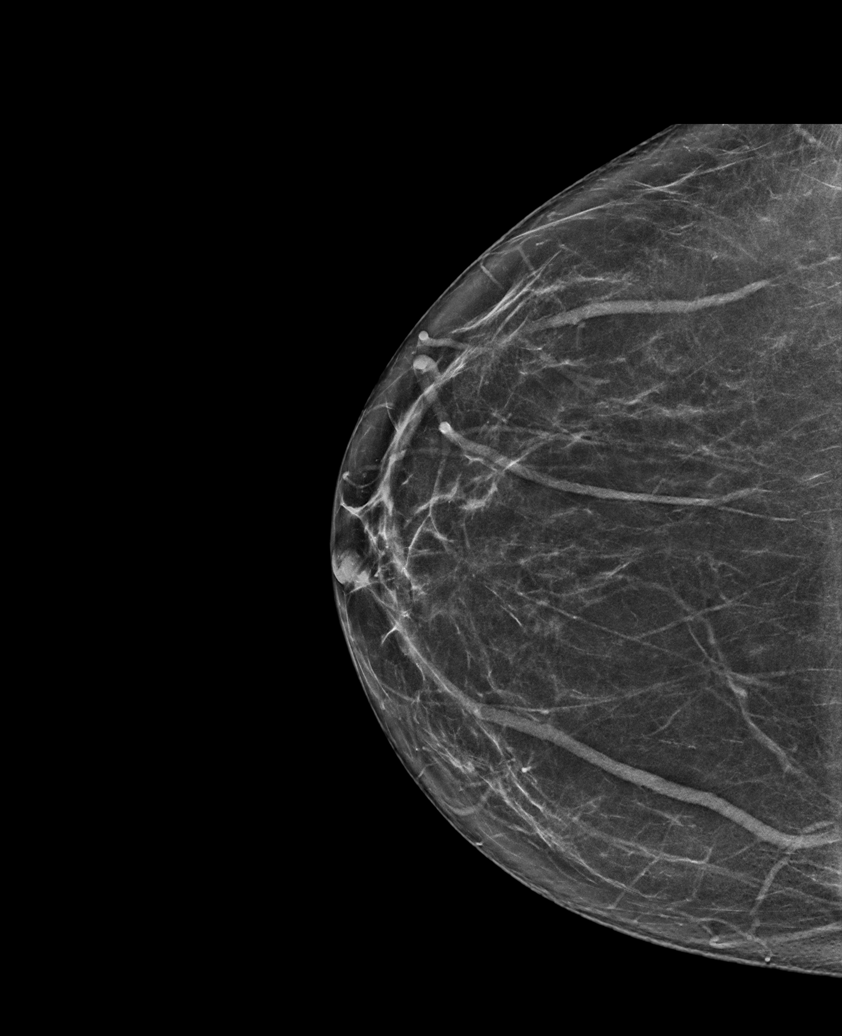

[R MLO tomo · tomo slice 44/87.0]
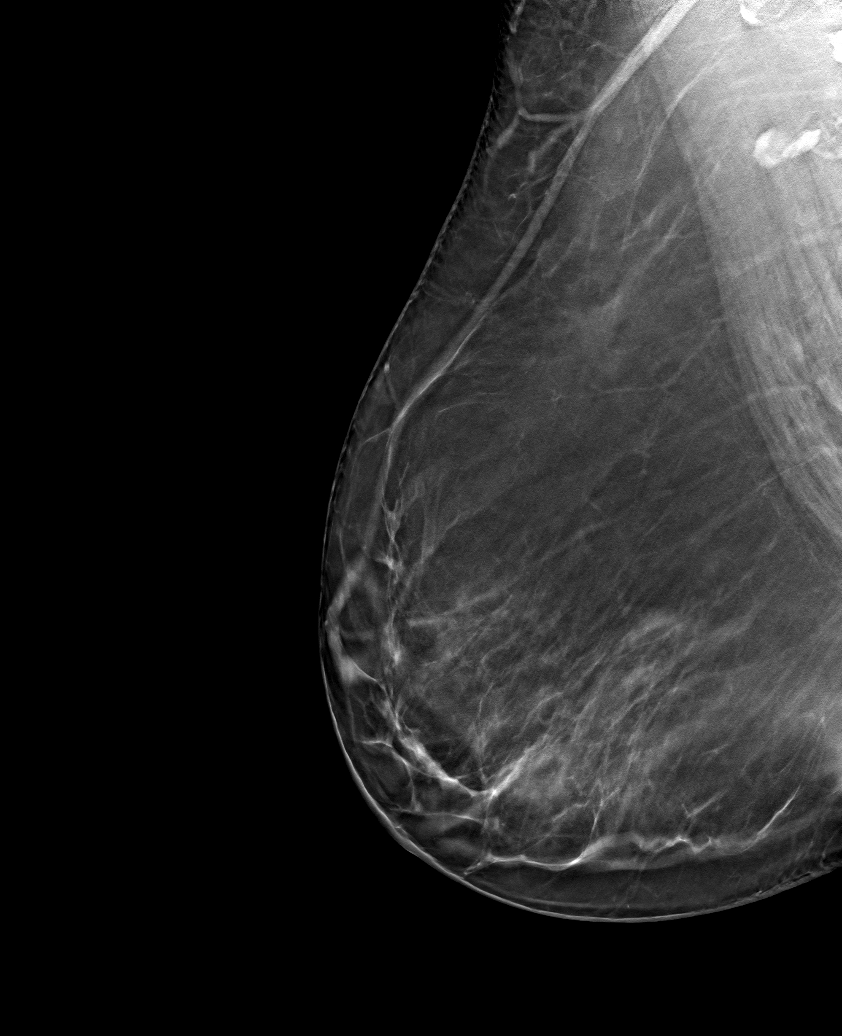

[6 of 30 positions shown; findings below may reference images not displayed]

ACR Breast Density Category b: There are scattered areas of
fibroglandular density.
FINDINGS: There are no findings suspicious for malignancy. Images were
processed with CAD.
IMPRESSION: No mammographic evidence of malignancy. A result letter of this
screening mammogram will be mailed directly to the patient.

RECOMMENDATION:
Screening mammogram in one year. (Code:[id number])

BI-RADS CATEGORY  1: Negative.

## 2019-12-27 ENCOUNTER — Other Ambulatory Visit: Payer: Self-pay | Admitting: Internal Medicine

## 2019-12-27 NOTE — Telephone Encounter (Signed)
Please refill as per office routine med refill policy (all routine meds refilled for 3 mo or monthly per pt preference up to one year from last visit, then month to month grace period for 3 mo, then further med refills will have to be denied)  

## 2020-01-24 ENCOUNTER — Other Ambulatory Visit: Payer: Self-pay | Admitting: Internal Medicine

## 2020-02-08 ENCOUNTER — Ambulatory Visit (INDEPENDENT_AMBULATORY_CARE_PROVIDER_SITE_OTHER): Payer: 59 | Admitting: Internal Medicine

## 2020-02-08 ENCOUNTER — Encounter: Payer: Self-pay | Admitting: Internal Medicine

## 2020-02-08 ENCOUNTER — Other Ambulatory Visit: Payer: Self-pay

## 2020-02-08 VITALS — BP 130/86 | HR 66 | Temp 98.1°F | Ht 65.0 in | Wt 213.0 lb

## 2020-02-08 DIAGNOSIS — E538 Deficiency of other specified B group vitamins: Secondary | ICD-10-CM

## 2020-02-08 DIAGNOSIS — R739 Hyperglycemia, unspecified: Secondary | ICD-10-CM

## 2020-02-08 DIAGNOSIS — Z0000 Encounter for general adult medical examination without abnormal findings: Secondary | ICD-10-CM

## 2020-02-08 DIAGNOSIS — E559 Vitamin D deficiency, unspecified: Secondary | ICD-10-CM

## 2020-02-08 DIAGNOSIS — M545 Low back pain: Secondary | ICD-10-CM

## 2020-02-08 DIAGNOSIS — G8929 Other chronic pain: Secondary | ICD-10-CM

## 2020-02-08 LAB — URINALYSIS, ROUTINE W REFLEX MICROSCOPIC
Bilirubin Urine: NEGATIVE
Ketones, ur: NEGATIVE
Leukocytes,Ua: NEGATIVE
Nitrite: NEGATIVE
Specific Gravity, Urine: 1.01 (ref 1.000–1.030)
Total Protein, Urine: NEGATIVE
Urine Glucose: NEGATIVE
Urobilinogen, UA: 0.2 (ref 0.0–1.0)
pH: 6 (ref 5.0–8.0)

## 2020-02-08 LAB — CBC WITH DIFFERENTIAL/PLATELET
Basophils Absolute: 0.1 10*3/uL (ref 0.0–0.1)
Basophils Relative: 0.9 % (ref 0.0–3.0)
Eosinophils Absolute: 0.2 10*3/uL (ref 0.0–0.7)
Eosinophils Relative: 2.4 % (ref 0.0–5.0)
HCT: 45.4 % (ref 36.0–46.0)
Hemoglobin: 15.2 g/dL — ABNORMAL HIGH (ref 12.0–15.0)
Lymphocytes Relative: 35.8 % (ref 12.0–46.0)
Lymphs Abs: 2.6 10*3/uL (ref 0.7–4.0)
MCHC: 33.5 g/dL (ref 30.0–36.0)
MCV: 89.1 fl (ref 78.0–100.0)
Monocytes Absolute: 0.6 10*3/uL (ref 0.1–1.0)
Monocytes Relative: 7.7 % (ref 3.0–12.0)
Neutro Abs: 3.8 10*3/uL (ref 1.4–7.7)
Neutrophils Relative %: 53.2 % (ref 43.0–77.0)
Platelets: 220 10*3/uL (ref 150.0–400.0)
RBC: 5.09 Mil/uL (ref 3.87–5.11)
RDW: 12.8 % (ref 11.5–15.5)
WBC: 7.1 10*3/uL (ref 4.0–10.5)

## 2020-02-08 LAB — BASIC METABOLIC PANEL
BUN: 11 mg/dL (ref 6–23)
CO2: 30 mEq/L (ref 19–32)
Calcium: 9.7 mg/dL (ref 8.4–10.5)
Chloride: 104 mEq/L (ref 96–112)
Creatinine, Ser: 0.94 mg/dL (ref 0.40–1.20)
GFR: 61.89 mL/min (ref >60.00)
Glucose, Bld: 93 mg/dL (ref 70–99)
Potassium: 4.4 mEq/L (ref 3.5–5.1)
Sodium: 140 mEq/L (ref 135–145)

## 2020-02-08 LAB — LIPID PANEL
Cholesterol: 165 mg/dL (ref 0–200)
HDL: 48.7 mg/dL (ref >39.00)
LDL Cholesterol: 91 mg/dL (ref 0–99)
NonHDL: 116.64
Total CHOL/HDL Ratio: 3
Triglycerides: 126 mg/dL (ref 0.0–149.0)
VLDL: 25.2 mg/dL (ref 0.0–40.0)

## 2020-02-08 LAB — VITAMIN B12: Vitamin B-12: 750 pg/mL (ref 211–911)

## 2020-02-08 LAB — HEPATIC FUNCTION PANEL
ALT: 20 U/L (ref 0–35)
AST: 19 U/L (ref 0–37)
Albumin: 4.4 g/dL (ref 3.5–5.2)
Alkaline Phosphatase: 57 U/L (ref 39–117)
Bilirubin, Direct: 0.1 mg/dL (ref 0.0–0.3)
Total Bilirubin: 0.6 mg/dL (ref 0.2–1.2)
Total Protein: 6.9 g/dL (ref 6.0–8.3)

## 2020-02-08 LAB — VITAMIN D 25 HYDROXY (VIT D DEFICIENCY, FRACTURES): VITD: 39.41 ng/mL (ref 30.00–100.00)

## 2020-02-08 LAB — TSH: TSH: 1.58 u[IU]/mL (ref 0.35–4.50)

## 2020-02-08 NOTE — Assessment & Plan Note (Signed)
For oral replacement 

## 2020-02-08 NOTE — Assessment & Plan Note (Signed)

## 2020-02-08 NOTE — Assessment & Plan Note (Signed)
To f/u pain management as planned monthly

## 2020-02-08 NOTE — Patient Instructions (Signed)

## 2020-02-08 NOTE — Progress Notes (Signed)
Subjective:    Patient ID: Pamela Gibson, female    DOB: Jun 03, 1966, 54 y.o.   MRN: [id number]  HPI  Here for wellness and f/u;  Overall doing ok;  Pt denies Chest pain, worsening SOB, DOE, wheezing, orthopnea, PND, worsening LE edema, palpitations, dizziness or syncope.  Pt denies neurological change such as new headache, facial or extremity weakness.  Pt denies polydipsia, polyuria, or low sugar symptoms. Pt states overall good compliance with treatment and medications, good tolerability, and has been trying to follow appropriate diet.  Pt denies worsening depressive symptoms, suicidal ideation or panic. No fever, night sweats, wt loss, loss of appetite, or other constitutional symptoms.  Pt states good ability with ADL's, has low fall risk, home safety reviewed and adequate, no other significant changes in hearing or vision, and only occasionally active with exercise.  No new complaints Past Medical History:  Diagnosis Date  . Anxiety   . BACK PAIN 07/23/2007  . CHEST PAIN 09/04/2010  . Cyst of right ovary 07/03/2015  . DEGENERATIVE JOINT DISEASE, LUMBAR SPINE 07/23/2007  . Depression   . Gastric polyp    x3  . GERD 07/22/2007  . H/O total hysterectomy   . HYPERLIPIDEMIA 07/22/2007  . IBS 07/23/2007  . MITRAL VALVE PROLAPSE 07/22/2007  . Pinched nerve    LEFT SIDE, DISC 3,4,5  . Pneumonia    YEARS AGO  . SINUSITIS- ACUTE-NOS 09/09/2007  . TACHYCARDIA 08/20/2010  . Tuberculosis     MOTHER HAD YEARS AGO  . Unspecified Vaginitis and Vulvovaginitis 12/17/2007   Past Surgical History:  Procedure Laterality Date  . ABDOMINAL HYSTERECTOMY    . CHOLECYSTECTOMY    . CYSTOSCOPY  11/11/2017   Procedure: CYSTOSCOPY;  Surgeon: Janyth Pupa, DO;  Location: [facility];  Service: Gynecology;;  . LAPAROSCOPIC BILATERAL SALPINGO OOPHERECTOMY Bilateral 11/11/2017   Procedure: LAPAROSCOPIC BILATERAL SALPINGO OOPHORECTOMY;  Surgeon: Janyth Pupa, DO;  Location: [facility];  Service: Gynecology;   Laterality: Bilateral;  . RLE tib/fib fx/surgury    . TONSILLECTOMY    . TUBAL LIGATION      reports that she has never smoked. She has never used smokeless tobacco. She reports that she does not drink alcohol and does not use drugs. family history includes Angina in her mother; Colon polyps in her mother; Coronary artery disease in her mother; Diabetes in an other family member; Heart disease in her mother; Hyperlipidemia in an other family member; Hypertension in an other family member; Stroke in her mother. Allergies  Allergen Reactions  . Atorvastatin     REACTION: more forgetful  . Ibuprofen     INSTRUCTED BY MD NOT TO TAKE DUE TO GI UPSET  . Penicillins     turns blue, childhood allergy Has patient had a PCN reaction causing immediate rash, facial/tongue/throat swelling, SOB or lightheadedness with hypotension: Yes Has patient had a PCN reaction causing severe rash involving mucus membranes or skin necrosis: No Has patient had a PCN reaction that required hospitalization: Unknown  Has patient had a PCN reaction occurring within the last 10 years: No If all of the above answers are "NO", then may proceed with Cephalosporin use.   . Simvastatin     REACTION: more forgetful  . Tetracycline     REACTION: itch   Current Outpatient Medications on File Prior to Visit  Medication Sig Dispense Refill  . ALPRAZolam (XANAX) 1 MG tablet TAKE 1 TABLET BY MOUTH THREE TIMES A DAY AS NEEDED 90 tablet 0  .  aspirin 81 MG EC tablet Take 81 mg by mouth daily.      . calcium carbonate (TUMS - DOSED IN MG ELEMENTAL CALCIUM) 500 MG chewable tablet Chew 1-2 tablets by mouth daily as needed for indigestion or heartburn.    . nystatin (MYCOSTATIN/NYSTOP) powder Use as directed topically to affected area daily as needed 60 g 2  . oxyCODONE-acetaminophen (PERCOCET) 10-325 MG tablet Take 1 tablet by mouth every 4 (four) hours as needed for pain.    Marland Kitchen PANTOPRAZOLE SODIUM PO Take by mouth.    . polyethylene  glycol (MIRALAX / GLYCOLAX) packet Take 17 g by mouth daily as needed.    . pravastatin (PRAVACHOL) 40 MG tablet TAKE 1 TABLET BY MOUTH DAILY 90 tablet 3  . vitamin B-12 (CYANOCOBALAMIN) 1000 MCG tablet Take 1 tablet (1,000 mcg total) by mouth daily. 90 tablet 3  . Vitamin D, Ergocalciferol, (DRISDOL) 1.25 MG (50000 UT) CAPS capsule Take 1 capsule (50,000 Units total) by mouth every 7 (seven) days. 12 capsule 0  . pantoprazole (PROTONIX) 40 MG tablet Take 40 mg by mouth daily.     No current facility-administered medications on file prior to visit.   Review of Systems All otherwise neg per pt    Objective:   Physical Exam BP 130/86 (BP Location: Left Arm, Patient Position: Sitting, Cuff Size: Large)   Pulse 66   Temp 98.1 F (36.7 C) (Oral)   Ht 5\' 5"  (1.651 m)   Wt 213 lb (96.6 kg)   SpO2 98%   BMI 35.45 kg/m  VS noted,  Constitutional: Pt appears in NAD HENT: Head: NCAT.  Right Ear: External ear normal.  Left Ear: External ear normal.  Eyes: . Pupils are equal, round, and reactive to light. Conjunctivae and EOM are normal Nose: without d/c or deformity Neck: Neck supple. Gross normal ROM Cardiovascular: Normal rate and regular rhythm.   Pulmonary/Chest: Effort normal and breath sounds without rales or wheezing.  Abd:  Soft, NT, ND, + BS, no organomegaly Neurological: Pt is alert. At baseline orientation, motor grossly intact Skin: Skin is warm. No rashes, other new lesions, no LE edema Psychiatric: Pt behavior is normal without agitation  All otherwise neg per pt  Lab Results  Component Value Date   WBC 8.3 08/10/2019   HGB 15.2 (H) 08/10/2019   HCT 44.9 08/10/2019   PLT 264.0 08/10/2019   GLUCOSE 95 08/10/2019   CHOL 164 08/10/2019   TRIG 138.0 08/10/2019   HDL 48.10 08/10/2019   LDLDIRECT 128.8 07/18/2008   LDLCALC 88 08/10/2019   ALT 22 08/10/2019   AST 20 08/10/2019   NA 139 08/10/2019   K 3.6 08/10/2019   CL 102 08/10/2019   CREATININE 0.87 08/10/2019    BUN 13 08/10/2019   CO2 29 08/10/2019   TSH 1.92 08/10/2019   HGBA1C 5.5 08/10/2019   MICROALBUR <0.7 08/10/2019      Assessment & Plan:

## 2020-02-08 NOTE — Assessment & Plan Note (Signed)
stable overall by history and exam, recent data reviewed with pt, and pt to continue medical treatment as before,  to f/u any worsening symptoms or concerns  

## 2020-02-09 LAB — HEMOGLOBIN A1C: Hgb A1c MFr Bld: 5.5 % (ref 4.6–6.5)

## 2020-03-01 ENCOUNTER — Other Ambulatory Visit: Payer: Self-pay | Admitting: Internal Medicine

## 2020-03-01 NOTE — Telephone Encounter (Signed)
Done erx 

## 2020-05-30 ENCOUNTER — Other Ambulatory Visit: Payer: Self-pay | Admitting: Internal Medicine

## 2020-05-30 DIAGNOSIS — Z1231 Encounter for screening mammogram for malignant neoplasm of breast: Secondary | ICD-10-CM

## 2020-05-30 NOTE — Telephone Encounter (Signed)
Done erx 

## 2020-06-06 ENCOUNTER — Other Ambulatory Visit: Payer: Self-pay

## 2020-06-06 ENCOUNTER — Encounter (INDEPENDENT_AMBULATORY_CARE_PROVIDER_SITE_OTHER): Payer: 59 | Admitting: Ophthalmology

## 2020-06-06 DIAGNOSIS — H35033 Hypertensive retinopathy, bilateral: Secondary | ICD-10-CM | POA: Diagnosis not present

## 2020-06-06 DIAGNOSIS — H34811 Central retinal vein occlusion, right eye, with macular edema: Secondary | ICD-10-CM

## 2020-06-06 DIAGNOSIS — I10 Essential (primary) hypertension: Secondary | ICD-10-CM | POA: Diagnosis not present

## 2020-06-06 DIAGNOSIS — H43813 Vitreous degeneration, bilateral: Secondary | ICD-10-CM

## 2020-06-06 DIAGNOSIS — H2513 Age-related nuclear cataract, bilateral: Secondary | ICD-10-CM

## 2020-06-12 ENCOUNTER — Ambulatory Visit (INDEPENDENT_AMBULATORY_CARE_PROVIDER_SITE_OTHER): Payer: 59 | Admitting: Internal Medicine

## 2020-06-12 ENCOUNTER — Other Ambulatory Visit: Payer: Self-pay

## 2020-06-12 ENCOUNTER — Encounter: Payer: Self-pay | Admitting: Internal Medicine

## 2020-06-12 VITALS — BP 122/80 | HR 76 | Temp 98.7°F | Ht 65.0 in | Wt 210.0 lb

## 2020-06-12 DIAGNOSIS — G8929 Other chronic pain: Secondary | ICD-10-CM

## 2020-06-12 DIAGNOSIS — E559 Vitamin D deficiency, unspecified: Secondary | ICD-10-CM | POA: Diagnosis not present

## 2020-06-12 DIAGNOSIS — E538 Deficiency of other specified B group vitamins: Secondary | ICD-10-CM | POA: Diagnosis not present

## 2020-06-12 DIAGNOSIS — R739 Hyperglycemia, unspecified: Secondary | ICD-10-CM

## 2020-06-12 DIAGNOSIS — M545 Low back pain, unspecified: Secondary | ICD-10-CM

## 2020-06-12 DIAGNOSIS — E785 Hyperlipidemia, unspecified: Secondary | ICD-10-CM

## 2020-06-12 NOTE — Progress Notes (Signed)
Subjective:    Patient ID: Pamela Gibson, female    DOB: [birth year], 54 y.o.   MRN: [id number]  HPI  Here after seen per optho with abnormal retinal exam c/w possible HTN or DM changes, getting eye injections for macular edema and blood   .Pt denies chest pain, increased sob or doe, wheezing, orthopnea, PND, increased LE swelling, palpitations, dizziness or syncope.  Pt denies new neurological symptoms such as new headache, or facial or extremity weakness or numbness  Pt denies polydipsia, polyuria  Pt continues to have recurring LBP without change in severity, bowel or bladder change, fever, wt loss,  worsening LE pain/numbness/weakness, gait change or falls. BP Readings from Last 3 Encounters:  06/12/20 122/80  02/08/20 130/86  08/10/19 124/80   Past Medical History:  Diagnosis Date  . Anxiety   . BACK PAIN 07/23/2007  . CHEST PAIN 09/04/2010  . Cyst of right ovary 07/03/2015  . DEGENERATIVE JOINT DISEASE, LUMBAR SPINE 07/23/2007  . Depression   . Gastric polyp    x3  . GERD 07/22/2007  . H/O total hysterectomy   . HYPERLIPIDEMIA 07/22/2007  . IBS 07/23/2007  . MITRAL VALVE PROLAPSE 07/22/2007  . Pinched nerve    LEFT SIDE, DISC 3,4,5  . Pneumonia    YEARS AGO  . SINUSITIS- ACUTE-NOS 09/09/2007  . TACHYCARDIA 08/20/2010  . Tuberculosis     MOTHER HAD YEARS AGO  . Unspecified Vaginitis and Vulvovaginitis 12/17/2007   Past Surgical History:  Procedure Laterality Date  . ABDOMINAL HYSTERECTOMY    . CHOLECYSTECTOMY    . CYSTOSCOPY  11/11/2017   Procedure: CYSTOSCOPY;  Surgeon: Janyth Pupa, DO;  Location: [facility];  Service: Gynecology;;  . LAPAROSCOPIC BILATERAL SALPINGO OOPHERECTOMY Bilateral 11/11/2017   Procedure: LAPAROSCOPIC BILATERAL SALPINGO OOPHORECTOMY;  Surgeon: Janyth Pupa, DO;  Location: [facility];  Service: Gynecology;  Laterality: Bilateral;  . RLE tib/fib fx/surgury    . TONSILLECTOMY    . TUBAL LIGATION      reports that she has never smoked. She has never  used smokeless tobacco. She reports that she does not drink alcohol and does not use drugs. family history includes Angina in her mother; Colon polyps in her mother; Coronary artery disease in her mother; Diabetes in an other family member; Heart disease in her mother; Hyperlipidemia in an other family member; Hypertension in an other family member; Stroke in her mother. Allergies  Allergen Reactions  . Atorvastatin     REACTION: more forgetful  . Ibuprofen     INSTRUCTED BY MD NOT TO TAKE DUE TO GI UPSET  . Penicillins     turns blue, childhood allergy Has patient had a PCN reaction causing immediate rash, facial/tongue/throat swelling, SOB or lightheadedness with hypotension: Yes Has patient had a PCN reaction causing severe rash involving mucus membranes or skin necrosis: No Has patient had a PCN reaction that required hospitalization: Unknown  Has patient had a PCN reaction occurring within the last 10 years: No If all of the above answers are "NO", then may proceed with Cephalosporin use.   . Simvastatin     REACTION: more forgetful  . Tetracycline     REACTION: itch   Current Outpatient Medications on File Prior to Visit  Medication Sig Dispense Refill  . ALPRAZolam (XANAX) 1 MG tablet TAKE 1 TABLET BY MOUTH THREE TIMES A DAY AS NEEDED 90 tablet 2  . aspirin 81 MG EC tablet Take 81 mg by mouth daily.      Marland Kitchen  calcium carbonate (TUMS - DOSED IN MG ELEMENTAL CALCIUM) 500 MG chewable tablet Chew 1-2 tablets by mouth daily as needed for indigestion or heartburn.    . [employer] PURELAX 17 GM/SCOOP powder SMARTSIG:17 Gram(s) By Mouth Daily PRN    . nystatin (MYCOSTATIN/NYSTOP) powder Use as directed topically to affected area daily as needed 60 g 2  . oxyCODONE-acetaminophen (PERCOCET) 10-325 MG tablet Take 1 tablet by mouth every 4 (four) hours as needed for pain.    . pantoprazole (PROTONIX) 40 MG tablet Take 40 mg by mouth daily.    Marland Kitchen PANTOPRAZOLE SODIUM PO Take by mouth.    . polyethylene  glycol (MIRALAX / GLYCOLAX) packet Take 17 g by mouth daily as needed.    . pravastatin (PRAVACHOL) 40 MG tablet TAKE 1 TABLET BY MOUTH DAILY 90 tablet 3  . trimethoprim-polymyxin b (POLYTRIM) ophthalmic solution SMARTSIG:In Eye(s)    . vitamin B-12 (CYANOCOBALAMIN) 1000 MCG tablet Take 1 tablet (1,000 mcg total) by mouth daily. 90 tablet 3   No current facility-administered medications on file prior to visit.   Review of Systems All otherwise neg per pt     Objective:   Physical Exam BP 122/80 (BP Location: Left Arm, Patient Position: Sitting, Cuff Size: Large)   Pulse 76   Temp 98.7 F (37.1 C) (Oral)   Ht 5\' 5"  (1.651 m)   Wt 210 lb (95.3 kg)   SpO2 97%   BMI 34.95 kg/m  VS noted,  Constitutional: Pt appears in NAD HENT: Head: NCAT.  Right Ear: External ear normal.  Left Ear: External ear normal.  Eyes: . Pupils are equal, round, and reactive to light. Conjunctivae and EOM are normal Nose: without d/c or deformity Neck: Neck supple. Gross normal ROM Cardiovascular: Normal rate and regular rhythm.   Pulmonary/Chest: Effort normal and breath sounds without rales or wheezing.  Abd:  Soft, NT, ND, + BS, no organomegaly Neurological: Pt is alert. At baseline orientation, motor grossly intact Skin: Skin is warm. No rashes, other new lesions, no LE edema Psychiatric: Pt behavior is normal without agitation  All otherwise neg per pt Lab Results  Component Value Date   WBC 7.1 02/08/2020   HGB 15.2 (H) 02/08/2020   HCT 45.4 02/08/2020   PLT 220.0 02/08/2020   GLUCOSE 93 02/08/2020   CHOL 165 02/08/2020   TRIG 126.0 02/08/2020   HDL 48.70 02/08/2020   LDLDIRECT 128.8 07/18/2008   LDLCALC 91 02/08/2020   ALT 20 02/08/2020   AST 19 02/08/2020   NA 140 02/08/2020   K 4.4 02/08/2020   CL 104 02/08/2020   CREATININE 0.94 02/08/2020   BUN 11 02/08/2020   CO2 30 02/08/2020   TSH 1.58 02/08/2020   HGBA1C 5.5 02/08/2020   MICROALBUR <0.7 08/10/2019      Assessment &  Plan:

## 2020-06-12 NOTE — Patient Instructions (Signed)
Your blood pressure and sugar are overall normal, and no treatment is needed  Please continue all other medications as before, and refills have been done if requested.  Please have the pharmacy call with any other refills you may need.  Please keep your appointments with your specialists as you may have planned

## 2020-06-17 ENCOUNTER — Encounter: Payer: Self-pay | Admitting: Internal Medicine

## 2020-06-17 NOTE — Assessment & Plan Note (Signed)
stable overall by history and exam, recent data reviewed with pt, and pt to continue medical treatment as before,  to f/u any worsening symptoms or concerns  

## 2020-06-17 NOTE — Assessment & Plan Note (Signed)
Cont oral replacement 

## 2020-06-17 NOTE — Assessment & Plan Note (Signed)
Lab Results  Component Value Date   LDLCALC 91 02/08/2020  stable overall by history and exam, recent data reviewed with pt, and pt to continue medical treatment as before,  to f/u any worsening symptoms or concerns

## 2020-06-17 NOTE — Assessment & Plan Note (Signed)
stable overall by history and exam, recent data reviewed with pt, and pt to continue medical treatment as before,  to f/u any worsening symptoms or concerns, for f/u lab 

## 2020-06-26 ENCOUNTER — Ambulatory Visit
Admission: RE | Admit: 2020-06-26 | Discharge: 2020-06-26 | Disposition: A | Discharge status: Home / Self Care | Payer: 59 | Source: Ambulatory Visit | Attending: Internal Medicine | Admitting: Internal Medicine

## 2020-06-26 ENCOUNTER — Other Ambulatory Visit: Payer: Self-pay

## 2020-06-26 DIAGNOSIS — Z1231 Encounter for screening mammogram for malignant neoplasm of breast: Secondary | ICD-10-CM

## 2020-07-03 ENCOUNTER — Other Ambulatory Visit: Payer: Self-pay

## 2020-07-03 ENCOUNTER — Encounter (INDEPENDENT_AMBULATORY_CARE_PROVIDER_SITE_OTHER): Payer: 59 | Admitting: Ophthalmology

## 2020-07-03 DIAGNOSIS — H34811 Central retinal vein occlusion, right eye, with macular edema: Secondary | ICD-10-CM | POA: Diagnosis not present

## 2020-07-03 DIAGNOSIS — I10 Essential (primary) hypertension: Secondary | ICD-10-CM | POA: Diagnosis not present

## 2020-07-03 DIAGNOSIS — H43813 Vitreous degeneration, bilateral: Secondary | ICD-10-CM

## 2020-07-03 DIAGNOSIS — H35033 Hypertensive retinopathy, bilateral: Secondary | ICD-10-CM | POA: Diagnosis not present

## 2020-07-30 ENCOUNTER — Encounter (INDEPENDENT_AMBULATORY_CARE_PROVIDER_SITE_OTHER): Payer: 59 | Admitting: Ophthalmology

## 2020-07-30 ENCOUNTER — Other Ambulatory Visit: Payer: Self-pay

## 2020-07-30 DIAGNOSIS — H35033 Hypertensive retinopathy, bilateral: Secondary | ICD-10-CM | POA: Diagnosis not present

## 2020-07-30 DIAGNOSIS — H43813 Vitreous degeneration, bilateral: Secondary | ICD-10-CM | POA: Diagnosis not present

## 2020-07-30 DIAGNOSIS — H34811 Central retinal vein occlusion, right eye, with macular edema: Secondary | ICD-10-CM | POA: Diagnosis not present

## 2020-07-30 DIAGNOSIS — I10 Essential (primary) hypertension: Secondary | ICD-10-CM

## 2020-07-30 DIAGNOSIS — H2513 Age-related nuclear cataract, bilateral: Secondary | ICD-10-CM

## 2020-08-01 ENCOUNTER — Other Ambulatory Visit (INDEPENDENT_AMBULATORY_CARE_PROVIDER_SITE_OTHER): Payer: 59

## 2020-08-01 ENCOUNTER — Other Ambulatory Visit: Payer: Self-pay

## 2020-08-01 DIAGNOSIS — Z0000 Encounter for general adult medical examination without abnormal findings: Secondary | ICD-10-CM | POA: Diagnosis not present

## 2020-08-01 DIAGNOSIS — R739 Hyperglycemia, unspecified: Secondary | ICD-10-CM | POA: Diagnosis not present

## 2020-08-01 LAB — BASIC METABOLIC PANEL
BUN: 14 mg/dL (ref 6–23)
CO2: 29 mEq/L (ref 19–32)
Calcium: 9.6 mg/dL (ref 8.4–10.5)
Chloride: 102 mEq/L (ref 96–112)
Creatinine, Ser: 0.96 mg/dL (ref 0.40–1.20)
GFR: 66.76 mL/min (ref >60.00)
Glucose, Bld: 93 mg/dL (ref 70–99)
Potassium: 3.8 mEq/L (ref 3.5–5.1)
Sodium: 138 mEq/L (ref 135–145)

## 2020-08-01 LAB — LIPID PANEL
Cholesterol: 164 mg/dL (ref 0–200)
HDL: 49 mg/dL (ref >39.00)
LDL Cholesterol: 92 mg/dL (ref 0–99)
NonHDL: 115.06
Total CHOL/HDL Ratio: 3
Triglycerides: 114 mg/dL (ref 0.0–149.0)
VLDL: 22.8 mg/dL (ref 0.0–40.0)

## 2020-08-01 LAB — HEPATIC FUNCTION PANEL
ALT: 22 U/L (ref 0–35)
AST: 20 U/L (ref 0–37)
Albumin: 4.3 g/dL (ref 3.5–5.2)
Alkaline Phosphatase: 52 U/L (ref 39–117)
Bilirubin, Direct: 0.2 mg/dL (ref 0.0–0.3)
Total Bilirubin: 0.8 mg/dL (ref 0.2–1.2)
Total Protein: 7.1 g/dL (ref 6.0–8.3)

## 2020-08-01 LAB — HEMOGLOBIN A1C: Hgb A1c MFr Bld: 5.6 % (ref 4.6–6.5)

## 2020-08-09 ENCOUNTER — Other Ambulatory Visit: Payer: Self-pay

## 2020-08-10 ENCOUNTER — Ambulatory Visit: Payer: 59 | Attending: Internal Medicine

## 2020-08-10 ENCOUNTER — Other Ambulatory Visit (HOSPITAL_BASED_OUTPATIENT_CLINIC_OR_DEPARTMENT_OTHER): Payer: Self-pay | Admitting: Internal Medicine

## 2020-08-10 ENCOUNTER — Encounter: Payer: Self-pay | Admitting: Internal Medicine

## 2020-08-10 ENCOUNTER — Other Ambulatory Visit: Payer: Self-pay

## 2020-08-10 ENCOUNTER — Ambulatory Visit (INDEPENDENT_AMBULATORY_CARE_PROVIDER_SITE_OTHER): Payer: 59 | Admitting: Internal Medicine

## 2020-08-10 VITALS — BP 112/80 | HR 70 | Temp 97.9°F | Ht 65.0 in | Wt 216.0 lb

## 2020-08-10 DIAGNOSIS — K219 Gastro-esophageal reflux disease without esophagitis: Secondary | ICD-10-CM | POA: Diagnosis not present

## 2020-08-10 DIAGNOSIS — E785 Hyperlipidemia, unspecified: Secondary | ICD-10-CM | POA: Diagnosis not present

## 2020-08-10 DIAGNOSIS — E538 Deficiency of other specified B group vitamins: Secondary | ICD-10-CM | POA: Diagnosis not present

## 2020-08-10 DIAGNOSIS — Z23 Encounter for immunization: Secondary | ICD-10-CM

## 2020-08-10 DIAGNOSIS — R739 Hyperglycemia, unspecified: Secondary | ICD-10-CM | POA: Diagnosis not present

## 2020-08-10 DIAGNOSIS — E559 Vitamin D deficiency, unspecified: Secondary | ICD-10-CM

## 2020-08-10 NOTE — Progress Notes (Signed)
   Covid-19 Vaccination Clinic  Name:  Pamela Gibson    MRN: [id number] DOB: 12/14/1965  08/10/2020  [name] was observed post Covid-19 immunization for 15 minutes without incident. She was provided with Vaccine Information Sheet and instruction to access the V-Safe system.   [name] was instructed to call 911 with any severe reactions post vaccine: Marland Kitchen Difficulty breathing  . Swelling of face and throat  . A fast heartbeat  . A bad rash all over body  . Dizziness and weakness   Immunizations Administered    Name Date Dose VIS Date Route   Pfizer COVID-19 Vaccine 08/10/2020 10:16 AM 0.3 mL 06/13/2020 Intramuscular   Manufacturer: [place]   Lot: [id number]   [place]: [id number]

## 2020-08-10 NOTE — Progress Notes (Deleted)
Acute Office Visit  Subjective:    Patient ID: Pamela Gibson, female    DOB: June 30, 1966, 54 y.o.   MRN: [id number]  No chief complaint on file.   HPI Patient is in today for ***  Past Medical History:  Diagnosis Date  . Anxiety   . BACK PAIN 07/23/2007  . CHEST PAIN 09/04/2010  . Cyst of right ovary 07/03/2015  . DEGENERATIVE JOINT DISEASE, LUMBAR SPINE 07/23/2007  . Depression   . Gastric polyp    x3  . GERD 07/22/2007  . H/O total hysterectomy   . HYPERLIPIDEMIA 07/22/2007  . IBS 07/23/2007  . MITRAL VALVE PROLAPSE 07/22/2007  . Pinched nerve    LEFT SIDE, DISC 3,4,5  . Pneumonia    YEARS AGO  . SINUSITIS- ACUTE-NOS 09/09/2007  . TACHYCARDIA 08/20/2010  . Tuberculosis     MOTHER HAD YEARS AGO  . Unspecified Vaginitis and Vulvovaginitis 12/17/2007    Past Surgical History:  Procedure Laterality Date  . ABDOMINAL HYSTERECTOMY    . CHOLECYSTECTOMY    . CYSTOSCOPY  11/11/2017   Procedure: CYSTOSCOPY;  Surgeon: Janyth Pupa, DO;  Location: [facility];  Service: Gynecology;;  . LAPAROSCOPIC BILATERAL SALPINGO OOPHERECTOMY Bilateral 11/11/2017   Procedure: LAPAROSCOPIC BILATERAL SALPINGO OOPHORECTOMY;  Surgeon: Janyth Pupa, DO;  Location: [facility];  Service: Gynecology;  Laterality: Bilateral;  . RLE tib/fib fx/surgury    . TONSILLECTOMY    . TUBAL LIGATION      Family History  Problem Relation Age of Onset  . Colon polyps Mother   . Angina Mother   . Heart disease Mother        CVA  . Coronary artery disease Mother   . Stroke Mother   . Hypertension Other   . Diabetes Other   . Hyperlipidemia Other   . Breast cancer Neg Hx     Social History   Socioeconomic History  . Marital status: Married    Spouse name: Not on file  . Number of children: 3  . Years of education: Not on file  . Highest education level: Not on file  Occupational History  . 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 Smoking  status: Never Smoker  . Smokeless tobacco: Never Used  Vaping Use  . Vaping Use: Never used  Substance and Sexual Activity  . Alcohol use: No  . Drug use: No  . Sexual activity: Never  Other Topics Concern  . Not on file  Social History Narrative  . Not on file   Social Determinants of Health   Financial Resource Strain: Not on file  Food Insecurity: Not on file  Transportation Needs: Not on file  Physical Activity: Not on file  Stress: Not on file  Social Connections: Not on file  Intimate Partner Violence: Not on file    Outpatient Medications Prior to Visit  Medication Sig Dispense Refill  . ALPRAZolam (XANAX) 1 MG tablet TAKE 1 TABLET BY MOUTH THREE TIMES A DAY AS NEEDED 90 tablet 2  . aspirin 81 MG EC tablet Take 81 mg by mouth daily.    . calcium carbonate (TUMS - DOSED IN MG ELEMENTAL CALCIUM) 500 MG chewable tablet Chew 1-2 tablets by mouth daily as needed for indigestion or heartburn.    . [employer] PURELAX 17 GM/SCOOP powder SMARTSIG:17 Gram(s) By Mouth Daily PRN    . dexlansoprazole (DEXILANT) 60 MG capsule 1 capsule    . DULoxetine (  CYMBALTA) 60 MG capsule 1 capsule    . nitrofurantoin, macrocrystal-monohydrate, (MACROBID) 100 MG capsule 1 capsule with food    . nystatin (MYCOSTATIN/NYSTOP) powder Use as directed topically to affected area daily as needed 60 g 2  . oxyCODONE-acetaminophen (PERCOCET) 10-325 MG tablet Take 1 tablet by mouth every 4 (four) hours as needed for pain.    . pantoprazole (PROTONIX) 40 MG tablet Take 40 mg by mouth daily.    Marland Kitchen PANTOPRAZOLE SODIUM PO Take by mouth.    . polyethylene glycol (MIRALAX / GLYCOLAX) packet Take 17 g by mouth daily as needed.    . pravastatin (PRAVACHOL) 40 MG tablet TAKE 1 TABLET BY MOUTH DAILY 90 tablet 3  . trimethoprim-polymyxin b (POLYTRIM) ophthalmic solution SMARTSIG:In Eye(s)    . vitamin B-12 (CYANOCOBALAMIN) 1000 MCG tablet Take 1 tablet (1,000 mcg total) by mouth daily. 90 tablet 3  .  HYDROcodone-acetaminophen (NORCO) 10-325 MG tablet      No facility-administered medications prior to visit.    Allergies  Allergen Reactions  . Atorvastatin     REACTION: more forgetful  . Ibuprofen     INSTRUCTED BY MD NOT TO TAKE DUE TO GI UPSET  . Penicillins     turns blue, childhood allergy Has patient had a PCN reaction causing immediate rash, facial/tongue/throat swelling, SOB or lightheadedness with hypotension: Yes Has patient had a PCN reaction causing severe rash involving mucus membranes or skin necrosis: No Has patient had a PCN reaction that required hospitalization: Unknown  Has patient had a PCN reaction occurring within the last 10 years: No If all of the above answers are "NO", then may proceed with Cephalosporin use.   . Simvastatin     REACTION: more forgetful  . Tetracycline     REACTION: itch    Review of Systems     Objective:    Physical Exam  BP 112/80 (BP Location: Left Arm, Patient Position: Sitting, Cuff Size: Large)   Pulse 70   Temp 97.9 F (36.6 C) (Oral)   Ht 5\' 5"  (1.651 m)   Wt 216 lb (98 kg)   SpO2 98%   BMI 35.94 kg/m  Wt Readings from Last 3 Encounters:  08/10/20 216 lb (98 kg)  06/12/20 210 lb (95.3 kg)  02/08/20 213 lb (96.6 kg)    Health Maintenance Due  Topic Date Due  . COVID-19 Vaccine (3 - Booster for Pfizer series) 06/27/2020    There are no preventive care reminders to display for this patient.   Lab Results  Component Value Date   TSH 1.58 02/08/2020   Lab Results  Component Value Date   WBC 7.1 02/08/2020   HGB 15.2 (H) 02/08/2020   HCT 45.4 02/08/2020   MCV 89.1 02/08/2020   PLT 220.0 02/08/2020   Lab Results  Component Value Date   NA 138 08/01/2020   K 3.8 08/01/2020   CO2 29 08/01/2020   GLUCOSE 93 08/01/2020   BUN 14 08/01/2020   CREATININE 0.96 08/01/2020   BILITOT 0.8 08/01/2020   ALKPHOS 52 08/01/2020   AST 20 08/01/2020   ALT 22 08/01/2020   PROT 7.1 08/01/2020   ALBUMIN 4.3  08/01/2020   CALCIUM 9.6 08/01/2020   ANIONGAP 8 11/02/2017   GFR 66.76 08/01/2020   Lab Results  Component Value Date   CHOL 164 08/01/2020   Lab Results  Component Value Date   HDL 49.00 08/01/2020   Lab Results  Component Value Date   LDLCALC 92  08/01/2020   Lab Results  Component Value Date   TRIG 114.0 08/01/2020   Lab Results  Component Value Date   CHOLHDL 3 08/01/2020   Lab Results  Component Value Date   HGBA1C 5.6 08/01/2020       Assessment & Plan:   Problem List Items Addressed This Visit   None      No orders of the defined types were placed in this encounter.    Cathlean Cower, MD

## 2020-08-10 NOTE — Progress Notes (Deleted)
  Subjective:     Patient ID: Pamela Gibson, female   DOB: 07-May-1966, 54 y.o.   MRN: [id number]  HPI   Review of Systems     Objective:   Physical Exam     Assessment:     ***    Plan:     ***

## 2020-08-10 NOTE — Progress Notes (Signed)
Subjective:    Patient ID: Pamela Gibson, female    DOB: Dec 25, 1965, 54 y.o.   MRN: [id number]  HPI  Here to f/u; overall doing ok,  Pt denies chest pain, increasing sob or doe, wheezing, orthopnea, PND, increased LE swelling, palpitations, dizziness or syncope.  Pt denies new neurological symptoms such as new headache, or facial or extremity weakness or numbness.  Pt denies polydipsia, polyuria, or low sugar episode.  Pt states overall good compliance with meds, mostly trying to follow appropriate diet, with wt overall stable,  but little exercise however. Denies worsening reflux, abd pain, dysphagia, n/v, bowel change or blood.   Wt Readings from Last 3 Encounters:  08/10/20 216 lb (98 kg)  06/12/20 210 lb (95.3 kg)  02/08/20 213 lb (96.6 kg)   BP Readings from Last 3 Encounters:  08/10/20 112/80  06/12/20 122/80  02/08/20 130/86  for booster shot later today Past Medical History:  Diagnosis Date  . Anxiety   . BACK PAIN 07/23/2007  . CHEST PAIN 09/04/2010  . Cyst of right ovary 07/03/2015  . DEGENERATIVE JOINT DISEASE, LUMBAR SPINE 07/23/2007  . Depression   . Gastric polyp    x3  . GERD 07/22/2007  . H/O total hysterectomy   . HYPERLIPIDEMIA 07/22/2007  . IBS 07/23/2007  . MITRAL VALVE PROLAPSE 07/22/2007  . Pinched nerve    LEFT SIDE, DISC 3,4,5  . Pneumonia    YEARS AGO  . SINUSITIS- ACUTE-NOS 09/09/2007  . TACHYCARDIA 08/20/2010  . Tuberculosis     MOTHER HAD YEARS AGO  . Unspecified Vaginitis and Vulvovaginitis 12/17/2007   Past Surgical History:  Procedure Laterality Date  . ABDOMINAL HYSTERECTOMY    . CHOLECYSTECTOMY    . CYSTOSCOPY  11/11/2017   Procedure: CYSTOSCOPY;  Surgeon: Janyth Pupa, DO;  Location: [facility];  Service: Gynecology;;  . LAPAROSCOPIC BILATERAL SALPINGO OOPHERECTOMY Bilateral 11/11/2017   Procedure: LAPAROSCOPIC BILATERAL SALPINGO OOPHORECTOMY;  Surgeon: Janyth Pupa, DO;  Location: [facility];  Service: Gynecology;  Laterality: Bilateral;  .  RLE tib/fib fx/surgury    . TONSILLECTOMY    . TUBAL LIGATION      reports that she has never smoked. She has never used smokeless tobacco. She reports that she does not drink alcohol and does not use drugs. family history includes Angina in her mother; Colon polyps in her mother; Coronary artery disease in her mother; Diabetes in an other family member; Heart disease in her mother; Hyperlipidemia in an other family member; Hypertension in an other family member; Stroke in her mother. Allergies  Allergen Reactions  . Atorvastatin     REACTION: more forgetful  . Ibuprofen     INSTRUCTED BY MD NOT TO TAKE DUE TO GI UPSET  . Penicillins     turns blue, childhood allergy Has patient had a PCN reaction causing immediate rash, facial/tongue/throat swelling, SOB or lightheadedness with hypotension: Yes Has patient had a PCN reaction causing severe rash involving mucus membranes or skin necrosis: No Has patient had a PCN reaction that required hospitalization: Unknown  Has patient had a PCN reaction occurring within the last 10 years: No If all of the above answers are "NO", then may proceed with Cephalosporin use.   . Simvastatin     REACTION: more forgetful  . Tetracycline     REACTION: itch   Current Outpatient Medications on File Prior to Visit  Medication Sig Dispense Refill  . ALPRAZolam (XANAX) 1 MG tablet TAKE 1 TABLET BY MOUTH THREE  TIMES A DAY AS NEEDED 90 tablet 2  . aspirin 81 MG EC tablet Take 81 mg by mouth daily.    . calcium carbonate (TUMS - DOSED IN MG ELEMENTAL CALCIUM) 500 MG chewable tablet Chew 1-2 tablets by mouth daily as needed for indigestion or heartburn.    . [employer] PURELAX 17 GM/SCOOP powder SMARTSIG:17 Gram(s) By Mouth Daily PRN    . dexlansoprazole (DEXILANT) 60 MG capsule 1 capsule    . DULoxetine (CYMBALTA) 60 MG capsule 1 capsule    . nitrofurantoin, macrocrystal-monohydrate, (MACROBID) 100 MG capsule 1 capsule with food    . nystatin (MYCOSTATIN/NYSTOP)  powder Use as directed topically to affected area daily as needed 60 g 2  . oxyCODONE-acetaminophen (PERCOCET) 10-325 MG tablet Take 1 tablet by mouth every 4 (four) hours as needed for pain.    . pantoprazole (PROTONIX) 40 MG tablet Take 40 mg by mouth daily.    Marland Kitchen PANTOPRAZOLE SODIUM PO Take by mouth.    . polyethylene glycol (MIRALAX / GLYCOLAX) packet Take 17 g by mouth daily as needed.    . pravastatin (PRAVACHOL) 40 MG tablet TAKE 1 TABLET BY MOUTH DAILY 90 tablet 3  . trimethoprim-polymyxin b (POLYTRIM) ophthalmic solution SMARTSIG:In Eye(s)    . vitamin B-12 (CYANOCOBALAMIN) 1000 MCG tablet Take 1 tablet (1,000 mcg total) by mouth daily. 90 tablet 3   No current facility-administered medications on file prior to visit.   Review of Systems All otherwise neg per pt    Objective:   Physical Exam BP 112/80 (BP Location: Left Arm, Patient Position: Sitting, Cuff Size: Large)   Pulse 70   Temp 97.9 F (36.6 C) (Oral)   Ht 5\' 5"  (1.651 m)   Wt 216 lb (98 kg)   SpO2 98%   BMI 35.94 kg/m  VS noted,  Constitutional: Pt appears in NAD HENT: Head: NCAT.  Right Ear: External ear normal.  Left Ear: External ear normal.  Eyes: . Pupils are equal, round, and reactive to light. Conjunctivae and EOM are normal Nose: without d/c or deformity Neck: Neck supple. Gross normal ROM Cardiovascular: Normal rate and regular rhythm.   Pulmonary/Chest: Effort normal and breath sounds without rales or wheezing.  Abd:  Soft, NT, ND, + BS, no organomegaly Neurological: Pt is alert. At baseline orientation, motor grossly intact Skin: Skin is warm. No rashes, other new lesions, no LE edema Psychiatric: Pt behavior is normal without agitation  All otherwise neg per pt Lab Results  Component Value Date   WBC 7.1 02/08/2020   HGB 15.2 (H) 02/08/2020   HCT 45.4 02/08/2020   PLT 220.0 02/08/2020   GLUCOSE 93 08/01/2020   CHOL 164 08/01/2020   TRIG 114.0 08/01/2020   HDL 49.00 08/01/2020   LDLDIRECT  128.8 07/18/2008   LDLCALC 92 08/01/2020   ALT 22 08/01/2020   AST 20 08/01/2020   NA 138 08/01/2020   K 3.8 08/01/2020   CL 102 08/01/2020   CREATININE 0.96 08/01/2020   BUN 14 08/01/2020   CO2 29 08/01/2020   TSH 1.58 02/08/2020   HGBA1C 5.6 08/01/2020   MICROALBUR <0.7 08/10/2019       Assessment & Plan:

## 2020-08-10 NOTE — Patient Instructions (Signed)
Please continue all other medications as before, and refills have been done if requested.  Please have the pharmacy call with any other refills you may need.  Please continue your efforts at being more active, low cholesterol diet, and weight control.  You are otherwise up to date with prevention measures today.  Please keep your appointments with your specialists as you may have planned  Please make an Appointment to return in 6 months, or sooner if needed, also with Lab Appointment for testing done 3-5 days before at the FIRST FLOOR Lab (so this is for TWO appointments - please see the scheduling desk as you leave)  Due to the ongoing Covid 19 pandemic, our lab now requires an appointment for any labs done at our office.  If you need labs done and do not have an appointment, please call our office ahead of time to schedule before presenting to the lab for your testing.  

## 2020-08-12 ENCOUNTER — Encounter: Payer: Self-pay | Admitting: Internal Medicine

## 2020-08-12 NOTE — Assessment & Plan Note (Signed)
Cont oral replaceement

## 2020-08-12 NOTE — Assessment & Plan Note (Addendum)
stable overall by history and exam, recent data reviewed with pt, and pt to continue medical treatment as before,  to f/u any worsening symptoms or concerns  I spent 31 minutes in preparing to see the patient by review of recent labs, imaging and procedures, obtaining and reviewing separately obtained history, communicating with the patient and family or caregiver, ordering medications, tests or procedures, and documenting clinical information in the EHR including the differential Dx, treatment, and any further evaluation and other management of hld, gerd, hyperglycemi, b12 and D deficiency

## 2020-08-12 NOTE — Assessment & Plan Note (Signed)
stable overall by history and exam, recent data reviewed with pt, and pt to continue medical treatment as before,  to f/u any worsening symptoms or concerns  

## 2020-08-13 MED FILL — PFIZER-BIONTECH COVID-19 VA: 30 | 1 days supply | Qty: 0 | Fill #0

## 2020-08-27 ENCOUNTER — Other Ambulatory Visit: Payer: Self-pay

## 2020-08-27 ENCOUNTER — Encounter (INDEPENDENT_AMBULATORY_CARE_PROVIDER_SITE_OTHER): Payer: 59 | Admitting: Ophthalmology

## 2020-08-27 DIAGNOSIS — I10 Essential (primary) hypertension: Secondary | ICD-10-CM

## 2020-08-27 DIAGNOSIS — H43813 Vitreous degeneration, bilateral: Secondary | ICD-10-CM

## 2020-08-27 DIAGNOSIS — H34811 Central retinal vein occlusion, right eye, with macular edema: Secondary | ICD-10-CM | POA: Diagnosis not present

## 2020-08-27 DIAGNOSIS — H35033 Hypertensive retinopathy, bilateral: Secondary | ICD-10-CM | POA: Diagnosis not present

## 2020-08-28 ENCOUNTER — Other Ambulatory Visit: Payer: Self-pay | Admitting: Internal Medicine

## 2020-09-26 ENCOUNTER — Other Ambulatory Visit: Payer: Self-pay

## 2020-09-26 ENCOUNTER — Encounter (INDEPENDENT_AMBULATORY_CARE_PROVIDER_SITE_OTHER): Payer: 59 | Admitting: Ophthalmology

## 2020-10-22 ENCOUNTER — Ambulatory Visit (HOSPITAL_COMMUNITY)
Admission: EM | Admit: 2020-10-22 | Discharge: 2020-10-22 | Disposition: A | Discharge status: Home / Self Care | Payer: 59 | Attending: Urgent Care | Admitting: Urgent Care

## 2020-10-22 ENCOUNTER — Other Ambulatory Visit: Payer: Self-pay

## 2020-10-22 ENCOUNTER — Encounter (HOSPITAL_COMMUNITY): Payer: Self-pay

## 2020-10-22 ENCOUNTER — Emergency Department (HOSPITAL_COMMUNITY)
Admission: EM | Admit: 2020-10-22 | Discharge: 2020-10-22 | Disposition: A | Discharge status: Home / Self Care | Payer: 59 | Attending: Emergency Medicine | Admitting: Emergency Medicine

## 2020-10-22 ENCOUNTER — Encounter (HOSPITAL_COMMUNITY): Payer: Self-pay | Admitting: Emergency Medicine

## 2020-10-22 ENCOUNTER — Emergency Department (HOSPITAL_COMMUNITY): Payer: 59

## 2020-10-22 DIAGNOSIS — R0789 Other chest pain: Secondary | ICD-10-CM

## 2020-10-22 DIAGNOSIS — Z7982 Long term (current) use of aspirin: Secondary | ICD-10-CM | POA: Insufficient documentation

## 2020-10-22 DIAGNOSIS — R072 Precordial pain: Secondary | ICD-10-CM | POA: Diagnosis not present

## 2020-10-22 LAB — I-STAT BETA HCG BLOOD, ED (MC, WL, AP ONLY): I-stat hCG, quantitative: <5 m[IU]/mL (ref <5)

## 2020-10-22 LAB — CBC
HCT: 48.3 % — ABNORMAL HIGH (ref 36.0–46.0)
Hemoglobin: 15.6 g/dL — ABNORMAL HIGH (ref 12.0–15.0)
MCH: 29.2 pg (ref 26.0–34.0)
MCHC: 32.3 g/dL (ref 30.0–36.0)
MCV: 90.4 fL (ref 80.0–100.0)
Platelets: 297 10*3/uL (ref 150–400)
RBC: 5.34 MIL/uL — ABNORMAL HIGH (ref 3.87–5.11)
RDW: 12.3 % (ref 11.5–15.5)
WBC: 9.4 10*3/uL (ref 4.0–10.5)
nRBC: 0 % (ref 0.0–0.2)

## 2020-10-22 LAB — BASIC METABOLIC PANEL
Anion gap: 11 (ref 5–15)
BUN: 11 mg/dL (ref 6–20)
CO2: 26 mmol/L (ref 22–32)
Calcium: 9.6 mg/dL (ref 8.9–10.3)
Chloride: 103 mmol/L (ref 98–111)
Creatinine, Ser: 0.89 mg/dL (ref 0.44–1.00)
GFR, Estimated: >60 mL/min (ref >60)
Glucose, Bld: 106 mg/dL — ABNORMAL HIGH (ref 70–99)
Potassium: 3.9 mmol/L (ref 3.5–5.1)
Sodium: 140 mmol/L (ref 135–145)

## 2020-10-22 LAB — TROPONIN I (HIGH SENSITIVITY)
Troponin I (High Sensitivity): 2 ng/L (ref <18)
Troponin I (High Sensitivity): 3 ng/L (ref <18)

## 2020-10-22 NOTE — Discharge Instructions (Signed)
Please report to the emergency room as you will need further tests to rule out a heart event such as a heart attack. There you can have more ekg's and blood work to make sure your heart is not in trouble. Please have your husband drive you there now for more work up.

## 2020-10-22 NOTE — ED Triage Notes (Signed)
Pt in with c/o right chest pain that radiates to her right breast and under her left breast and her back  Denies any sob, dizziness, N/V  Pt has not had medication help with pain

## 2020-10-22 NOTE — ED Provider Notes (Addendum)
[place]   MRN: [id number] DOB: Dec 22, 1965  Subjective:   Pamela Gibson is a 55 y.o. female presenting for 2 to 3-day history of acute onset pain that started over the right side of her lower chest and radiated toward the lateral chest and into the thoracic back.  In the past day she has started to have more left-sided chest pain of the lower side that penetrates inwardly behind the breast.  Patient has a history of hyperlipidemia and is on a statin for this.  Denies history of diabetes, hypertension.  Denies smoking history.  Has a history of acid reflux and is taking Protonix for this.  Family history of heart disease, stroke, diabetes.  No current facility-administered medications for this encounter.  Current Outpatient Medications:  .  ALPRAZolam (XANAX) 1 MG tablet, TAKE 1 TABLET BY MOUTH THREE TIMES A DAY AS NEEDED, Disp: 90 tablet, Rfl: 2 .  aspirin 81 MG EC tablet, Take 81 mg by mouth daily., Disp: , Rfl:  .  calcium carbonate (TUMS - DOSED IN MG ELEMENTAL CALCIUM) 500 MG chewable tablet, Chew 1-2 tablets by mouth daily as needed for indigestion or heartburn., Disp: , Rfl:  .  [employer] PURELAX 17 GM/SCOOP powder, SMARTSIG:17 Gram(s) By Mouth Daily PRN, Disp: , Rfl:  .  oxyCODONE-acetaminophen (PERCOCET) 10-325 MG tablet, Take 1 tablet by mouth every 4 (four) hours as needed for pain., Disp: , Rfl:  .  pantoprazole (PROTONIX) 40 MG tablet, Take 40 mg by mouth daily., Disp: , Rfl:  .  polyethylene glycol (MIRALAX / GLYCOLAX) packet, Take 17 g by mouth daily as needed., Disp: , Rfl:  .  pravastatin (PRAVACHOL) 40 MG tablet, TAKE 1 TABLET BY MOUTH DAILY, Disp: 90 tablet, Rfl: 3 .  vitamin B-12 (CYANOCOBALAMIN) 1000 MCG tablet, Take 1 tablet (1,000 mcg total) by mouth daily., Disp: 90 tablet, Rfl: 3   Allergies  Allergen Reactions  . Atorvastatin     REACTION: more forgetful  . Ibuprofen     INSTRUCTED BY MD NOT TO TAKE DUE TO GI UPSET  . Penicillins     turns  blue, childhood allergy Has patient had a PCN reaction causing immediate rash, facial/tongue/throat swelling, SOB or lightheadedness with hypotension: Yes Has patient had a PCN reaction causing severe rash involving mucus membranes or skin necrosis: No Has patient had a PCN reaction that required hospitalization: Unknown  Has patient had a PCN reaction occurring within the last 10 years: No If all of the above answers are "NO", then may proceed with Cephalosporin use.   . Simvastatin     REACTION: more forgetful  . Tetracycline     REACTION: itch    Past Medical History:  Diagnosis Date  . Anxiety   . BACK PAIN 07/23/2007  . CHEST PAIN 09/04/2010  . Cyst of right ovary 07/03/2015  . DEGENERATIVE JOINT DISEASE, LUMBAR SPINE 07/23/2007  . Depression   . Gastric polyp    x3  . GERD 07/22/2007  . H/O total hysterectomy   . HYPERLIPIDEMIA 07/22/2007  . IBS 07/23/2007  . MITRAL VALVE PROLAPSE 07/22/2007  . Pinched nerve    LEFT SIDE, DISC 3,4,5  . Pneumonia    YEARS AGO  . SINUSITIS- ACUTE-NOS 09/09/2007  . TACHYCARDIA 08/20/2010  . Tuberculosis     MOTHER HAD YEARS AGO  . Unspecified Vaginitis and Vulvovaginitis 12/17/2007     Past Surgical History:  Procedure Laterality Date  . ABDOMINAL HYSTERECTOMY    .  CHOLECYSTECTOMY    . CYSTOSCOPY  11/11/2017   Procedure: CYSTOSCOPY;  Surgeon: Janyth Pupa, DO;  Location: [facility];  Service: Gynecology;;  . LAPAROSCOPIC BILATERAL SALPINGO OOPHERECTOMY Bilateral 11/11/2017   Procedure: LAPAROSCOPIC BILATERAL SALPINGO OOPHORECTOMY;  Surgeon: Janyth Pupa, DO;  Location: [facility];  Service: Gynecology;  Laterality: Bilateral;  . RLE tib/fib fx/surgury    . TONSILLECTOMY    . TUBAL LIGATION      Family History  Problem Relation Age of Onset  . Colon polyps Mother   . Angina Mother   . Heart disease Mother        CVA  . Coronary artery disease Mother   . Stroke Mother   . Hypertension Other   . Diabetes Other   . Hyperlipidemia  Other   . Breast cancer Neg Hx     Social History   Tobacco Use  . Smoking status: Never Smoker  . Smokeless tobacco: Never Used  Vaping Use  . Vaping Use: Never used  Substance Use Topics  . Alcohol use: No  . Drug use: No    ROS   Objective:   Vitals: BP 138/78   Pulse 74   Temp 98.5 F (36.9 C)   Resp 19   SpO2 100%   Physical Exam Constitutional:      General: She is not in acute distress.    Appearance: Normal appearance. She is well-developed. She is obese. She is not ill-appearing, toxic-appearing or diaphoretic.  HENT:     Head: Normocephalic and atraumatic.     Nose: Nose normal.     Mouth/Throat:     Mouth: Mucous membranes are moist.  Eyes:     Extraocular Movements: Extraocular movements intact.     Pupils: Pupils are equal, round, and reactive to light.  Cardiovascular:     Rate and Rhythm: Normal rate and regular rhythm.     Pulses: Normal pulses.     Heart sounds: Normal heart sounds. No murmur heard. No friction rub. No gallop.   Pulmonary:     Effort: Pulmonary effort is normal. No respiratory distress.     Breath sounds: Normal breath sounds. No stridor. No wheezing, rhonchi or rales.  Skin:    General: Skin is warm and dry.     Findings: No rash.  Neurological:     Mental Status: She is alert and oriented to person, place, and time.  Psychiatric:        Mood and Affect: Mood normal.        Behavior: Behavior normal.        Thought Content: Thought content normal.     ED ECG REPORT   Date: 10/22/2020  Rate: 73bpm  Rhythm: normal sinus rhythm  QRS Axis: normal  Intervals: normal  ST/T Wave abnormalities: nonspecific T wave changes  Conduction Disutrbances:none  Narrative Interpretation:   Old EKG Reviewed: unchanged  I have personally reviewed the EKG tracing and agree with the computerized printout as noted.   Assessment and Plan :   PDMP not reviewed this encounter.  1. Atypical chest pain     Discussed reassuring  EKG with patient but as she continues to have ongoing piercing left-sided chest pain moderate in severity despite taking oxycodone, I recommended further evaluation in the emergency room including serial EKGs, troponin levels to further rule out a heart event.  Patient contracts for safety, will have her husband drive her to the ER now.   Jaynee Eagles, [place] 10/22/20 1913

## 2020-10-22 NOTE — ED Triage Notes (Signed)
Pt reports left sided chest pain described as stabbing that started yesterday.  No other symptoms noted/reported at this time.

## 2020-10-22 NOTE — ED Provider Notes (Signed)
[place] EMERGENCY DEPARTMENT Provider Note   CSN: [id number] Arrival date & time: 10/22/20  1902     History Chief Complaint  Patient presents with  . Chest Pain    Pamela Gibson is a 55 y.o. female w/ h/o HLD, GERD, anxiety, and depression who presents to the ED for chest pain. Intermittent substernal chest heaviness several days ago that comes on spontaneously and resolves after several minutes. Today, patient having brief intermittent sharp, pinprick-like pains under her L breast. Pain not associated with exertion. No associated diaphoresis, nausea, palpitations, or SOB. Normal stress test in 2020. No recent illnesses. Denies fever, chills, headache, cough, hemoptysis, abdominal pain, N/V/D, or urinary symptoms.  The history is provided by the patient and medical records.  Chest Pain Pain location:  Substernal area and L chest Pain quality: sharp   Pain quality comment:  Heaviness Pain radiates to:  Does not radiate Pain severity:  Mild Onset quality:  Sudden Duration:  2 days Timing:  Intermittent Progression:  Unchanged Chronicity:  New Context: at rest   Relieved by:  Nothing Worsened by:  Nothing Ineffective treatments:  None tried Associated symptoms: no abdominal pain, no anxiety, no back pain, no cough, no diaphoresis, no fever, no lower extremity edema, no nausea, no near-syncope, no palpitations, no shortness of breath and no vomiting   Risk factors: high cholesterol   Risk factors: no coronary artery disease, no diabetes mellitus, no hypertension, no prior DVT/PE and no smoking        Past Medical History:  Diagnosis Date  . Anxiety   . BACK PAIN 07/23/2007  . CHEST PAIN 09/04/2010  . Cyst of right ovary 07/03/2015  . DEGENERATIVE JOINT DISEASE, LUMBAR SPINE 07/23/2007  . Depression   . Gastric polyp    x3  . GERD 07/22/2007  . H/O total hysterectomy   . HYPERLIPIDEMIA 07/22/2007  . IBS 07/23/2007  . MITRAL VALVE PROLAPSE  07/22/2007  . Pinched nerve    LEFT SIDE, DISC 3,4,5  . Pneumonia    YEARS AGO  . SINUSITIS- ACUTE-NOS 09/09/2007  . TACHYCARDIA 08/20/2010  . Tuberculosis     MOTHER HAD YEARS AGO  . Unspecified Vaginitis and Vulvovaginitis 12/17/2007    Patient Active Problem List   Diagnosis Date Noted  . Vitamin B12 deficiency 08/10/2019  . Vitamin D deficiency 08/10/2019  . Chest pain 04/12/2019  . Hypersomnolence 02/08/2019  . Hyperglycemia 02/08/2019  . Cloudy urine 08/05/2018  . Right flank pain 05/04/2018  . Dysuria 05/04/2018  . Polycythemia 02/05/2018  . Hot flashes 02/05/2018  . Microhematuria 01/09/2017  . Insomnia 01/09/2017  . Barrett esophagus 09/06/2016  . Arthritis of hand, degenerative 07/03/2016  . Cyst of right ovary 07/03/2015  . Abnormal urine 12/27/2014  . AKI (acute kidney injury) ([place]) 12/27/2014  . Hand pain 06/15/2013  . Chronic low back pain 06/15/2013  . Lumbar disc disease 06/15/2013  . Abnormal liver function test 10/27/2012  . Allergic rhinitis 02/13/2012  . Vertigo 02/13/2012  . Anxiety 12/08/2011  . Multiple gastric polyps 12/11/2010  . Adenomatous colon polyp 12/11/2010  . Preventative health care 12/08/2010  . IBS 07/23/2007  . DEGENERATIVE JOINT DISEASE, LUMBAR SPINE 07/23/2007  . Hyperlipidemia 07/22/2007  . MITRAL VALVE PROLAPSE 07/22/2007  . GERD 07/22/2007    Past Surgical History:  Procedure Laterality Date  . ABDOMINAL HYSTERECTOMY    . CHOLECYSTECTOMY    . CYSTOSCOPY  11/11/2017   Procedure: CYSTOSCOPY;  Surgeon: Janyth Pupa, DO;  Location: [facility];  Service: Gynecology;;  . LAPAROSCOPIC BILATERAL SALPINGO OOPHERECTOMY Bilateral 11/11/2017   Procedure: LAPAROSCOPIC BILATERAL SALPINGO OOPHORECTOMY;  Surgeon: Janyth Pupa, DO;  Location: [facility];  Service: Gynecology;  Laterality: Bilateral;  . RLE tib/fib fx/surgury    . TONSILLECTOMY    . TUBAL LIGATION       OB History   No obstetric history on file.     Family History   Problem Relation Age of Onset  . Colon polyps Mother   . Angina Mother   . Heart disease Mother        CVA  . Coronary artery disease Mother   . Stroke Mother   . Hypertension Other   . Diabetes Other   . Hyperlipidemia Other   . Breast cancer Neg Hx     Social History   Tobacco Use  . Smoking status: Never Smoker  . Smokeless tobacco: Never Used  Vaping Use  . Vaping Use: Never used  Substance Use Topics  . Alcohol use: No  . Drug use: No    Home Medications Prior to Admission medications   Medication Sig Start Date End Date Taking? Authorizing Provider  ALPRAZolam Duanne Moron) 1 MG tablet TAKE 1 TABLET BY MOUTH THREE TIMES A DAY AS NEEDED 08/28/20   Biagio Borg, MD  aspirin 81 MG EC tablet Take 81 mg by mouth daily.    [provider]  calcium carbonate (TUMS - DOSED IN MG ELEMENTAL CALCIUM) 500 MG chewable tablet Chew 1-2 tablets by mouth daily as needed for indigestion or heartburn.    [provider]  [employer] PURELAX 17 GM/SCOOP powder SMARTSIG:17 Gram(s) By Mouth Daily PRN 05/20/20   [provider]  oxyCODONE-acetaminophen (PERCOCET) 10-325 MG tablet Take 1 tablet by mouth every 4 (four) hours as needed for pain.    [provider]  pantoprazole (PROTONIX) 40 MG tablet Take 40 mg by mouth daily. 02/02/20   [provider]  polyethylene glycol (MIRALAX / GLYCOLAX) packet Take 17 g by mouth daily as needed.    [provider]  pravastatin (PRAVACHOL) 40 MG tablet TAKE 1 TABLET BY MOUTH DAILY 12/27/19   Biagio Borg, MD  vitamin B-12 (CYANOCOBALAMIN) 1000 MCG tablet Take 1 tablet (1,000 mcg total) by mouth daily. 08/10/19   Biagio Borg, MD    Allergies    Atorvastatin, Ibuprofen, Penicillins, Simvastatin, and Tetracycline  Review of Systems   Review of Systems  Constitutional: Negative for chills, diaphoresis and fever.  HENT: Negative for ear pain and sore throat.   Eyes: Negative for pain and visual disturbance.   Respiratory: Negative for cough and shortness of breath.   Cardiovascular: Positive for chest pain. Negative for palpitations and near-syncope.  Gastrointestinal: Negative for abdominal pain, nausea and vomiting.  Genitourinary: Negative for dysuria and hematuria.  Musculoskeletal: Negative for arthralgias and back pain.  Skin: Negative for color change and rash.  Neurological: Negative for seizures and syncope.  All other systems reviewed and are negative.   Physical Exam Updated Vital Signs BP (!) 142/81 (BP Location: Right Arm)   Pulse 68   Temp 98 F (36.7 C) (Oral)   Resp 16   Ht 5\' 5"  (1.651 m)   Wt 98 kg   SpO2 100%   BMI 35.95 kg/m   Physical Exam Vitals and nursing note reviewed.  Constitutional:      General: She is awake. She is not in acute distress.    Appearance: Normal appearance.  She is well-developed, well-groomed and overweight. She is not ill-appearing.  HENT:     Head: Normocephalic and atraumatic.     Right Ear: External ear normal.     Left Ear: External ear normal.     Nose: Nose normal.     Mouth/Throat:     Mouth: Mucous membranes are moist.     Pharynx: Oropharynx is clear. No oropharyngeal exudate or posterior oropharyngeal erythema.  Eyes:     General: No scleral icterus.       Right eye: No discharge.        Left eye: No discharge.     Conjunctiva/sclera: Conjunctivae normal.  Cardiovascular:     Rate and Rhythm: Normal rate and regular rhythm.     Pulses: Normal pulses.     Heart sounds: Normal heart sounds.  Pulmonary:     Effort: Pulmonary effort is normal. No respiratory distress.     Breath sounds: Normal breath sounds. No wheezing, rhonchi or rales.  Abdominal:     General: Abdomen is flat. There is no distension.     Palpations: Abdomen is soft.     Tenderness: There is no abdominal tenderness. There is no guarding or rebound.  Musculoskeletal:     Right lower leg: No edema.     Left lower leg: No edema.  Skin:    General:  Skin is warm and dry.     Findings: No rash.  Neurological:     General: No focal deficit present.     Mental Status: She is alert and oriented to person, place, and time.  Psychiatric:        Mood and Affect: Mood normal.        Behavior: Behavior normal. Behavior is cooperative.     ED Results / Procedures / Treatments   Labs (all labs ordered are listed, but only abnormal results are displayed) Labs Reviewed  BASIC METABOLIC PANEL - Abnormal; Notable for the following components:      Result Value   Glucose, Bld 106 (*)    All other components within normal limits  CBC - Abnormal; Notable for the following components:   RBC 5.34 (*)    Hemoglobin 15.6 (*)    HCT 48.3 (*)    All other components within normal limits  I-STAT BETA HCG BLOOD, ED ([facility], [facility], [facility] ONLY)  TROPONIN I (HIGH SENSITIVITY)  TROPONIN I (HIGH SENSITIVITY)    EKG None  Radiology DG Chest 2 View  Result Date: 10/22/2020 CLINICAL DATA:  Chest pain EXAM: CHEST - 2 VIEW COMPARISON:  04/12/2019 FINDINGS: The heart size and mediastinal contours are within normal limits. Both lungs are clear. The visualized skeletal structures are unremarkable. IMPRESSION: No active cardiopulmonary disease. Electronically Signed   By: Donavan Foil M.D.   On: 10/22/2020 19:46    Procedures Procedures  Medications Ordered in ED Medications - No data to display  ED Course  I have reviewed the triage vital signs and the nursing notes.  Pertinent labs & imaging results that were available during my care of the patient were reviewed by me and considered in my medical decision making (see chart for details).    MDM Rules/Calculators/A&P                          Patient is a 44yoF with history and physical as described above who presents to the ED for chest pain. VS reassuring and HDS. Patient resting comfortably  and chest pain free in ED. Initial workup started in triage and included troponin, CBC, BMP, CXR, ECG, and  hCG.  CXR unremarkable. ECG reassuring for acute ischemic or dysrhythmic changes. CBC and BMP unremarkable. Low risk Well's score; doubt PE at this time. CXR reassuring for PTX, PNA, or widened mediastinum. ECG reassuring for pericarditis or cardiac dysrhythmia. Overall low suspicion for acute emergent pathology at this time. Suspect presentation likely musculoskeletal vs gastric reflux given known underlying history. Recommend follow up with PCP on outpatient basis to further evaluate chest discomfort if it continues as well as discuss possible cardiac stress testing. Strict return precautions provided and discussed. Questions and concerns addressed. Patient verbalized understanding and amenable with discharge plan. Discharged in stable condition.  Final Clinical Impression(s) / ED Diagnoses Final diagnoses:  Precordial pain    Rx / DC Orders ED Discharge Orders    None       Christy Gentles, MD 10/23/20 [id number]    Margette Fast, MD 11/01/20 1000

## 2020-11-07 ENCOUNTER — Other Ambulatory Visit: Payer: Self-pay

## 2020-11-07 ENCOUNTER — Encounter (INDEPENDENT_AMBULATORY_CARE_PROVIDER_SITE_OTHER): Payer: 59 | Admitting: Ophthalmology

## 2020-11-07 DIAGNOSIS — H34811 Central retinal vein occlusion, right eye, with macular edema: Secondary | ICD-10-CM | POA: Diagnosis not present

## 2020-11-07 DIAGNOSIS — H43813 Vitreous degeneration, bilateral: Secondary | ICD-10-CM | POA: Diagnosis not present

## 2020-11-07 DIAGNOSIS — H35033 Hypertensive retinopathy, bilateral: Secondary | ICD-10-CM | POA: Diagnosis not present

## 2020-11-07 DIAGNOSIS — I10 Essential (primary) hypertension: Secondary | ICD-10-CM

## 2020-11-25 ENCOUNTER — Other Ambulatory Visit: Payer: Self-pay | Admitting: Internal Medicine

## 2020-12-19 ENCOUNTER — Encounter (INDEPENDENT_AMBULATORY_CARE_PROVIDER_SITE_OTHER): Payer: 59 | Admitting: Ophthalmology

## 2020-12-19 ENCOUNTER — Other Ambulatory Visit: Payer: Self-pay

## 2020-12-19 DIAGNOSIS — H43813 Vitreous degeneration, bilateral: Secondary | ICD-10-CM

## 2020-12-19 DIAGNOSIS — H35033 Hypertensive retinopathy, bilateral: Secondary | ICD-10-CM

## 2020-12-19 DIAGNOSIS — H34811 Central retinal vein occlusion, right eye, with macular edema: Secondary | ICD-10-CM

## 2020-12-19 DIAGNOSIS — I10 Essential (primary) hypertension: Secondary | ICD-10-CM | POA: Diagnosis not present

## 2021-02-12 ENCOUNTER — Other Ambulatory Visit: Payer: Self-pay

## 2021-02-13 ENCOUNTER — Encounter: Payer: Self-pay | Admitting: Internal Medicine

## 2021-02-13 ENCOUNTER — Ambulatory Visit (INDEPENDENT_AMBULATORY_CARE_PROVIDER_SITE_OTHER): Payer: 59 | Admitting: Internal Medicine

## 2021-02-13 VITALS — BP 120/82 | HR 63 | Temp 97.7°F | Ht 65.0 in | Wt 219.0 lb

## 2021-02-13 DIAGNOSIS — K319 Disease of stomach and duodenum, unspecified: Secondary | ICD-10-CM

## 2021-02-13 DIAGNOSIS — N951 Menopausal and female climacteric states: Secondary | ICD-10-CM | POA: Insufficient documentation

## 2021-02-13 DIAGNOSIS — R739 Hyperglycemia, unspecified: Secondary | ICD-10-CM | POA: Diagnosis not present

## 2021-02-13 DIAGNOSIS — E538 Deficiency of other specified B group vitamins: Secondary | ICD-10-CM

## 2021-02-13 DIAGNOSIS — Z90710 Acquired absence of both cervix and uterus: Secondary | ICD-10-CM | POA: Insufficient documentation

## 2021-02-13 DIAGNOSIS — Z0001 Encounter for general adult medical examination with abnormal findings: Secondary | ICD-10-CM | POA: Diagnosis not present

## 2021-02-13 DIAGNOSIS — E7800 Pure hypercholesterolemia, unspecified: Secondary | ICD-10-CM | POA: Diagnosis not present

## 2021-02-13 DIAGNOSIS — E559 Vitamin D deficiency, unspecified: Secondary | ICD-10-CM

## 2021-02-13 DIAGNOSIS — N9489 Other specified conditions associated with female genital organs and menstrual cycle: Secondary | ICD-10-CM | POA: Insufficient documentation

## 2021-02-13 DIAGNOSIS — G894 Chronic pain syndrome: Secondary | ICD-10-CM | POA: Insufficient documentation

## 2021-02-13 DIAGNOSIS — I700 Atherosclerosis of aorta: Secondary | ICD-10-CM | POA: Insufficient documentation

## 2021-02-13 DIAGNOSIS — K6289 Other specified diseases of anus and rectum: Secondary | ICD-10-CM | POA: Insufficient documentation

## 2021-02-13 DIAGNOSIS — E785 Hyperlipidemia, unspecified: Secondary | ICD-10-CM

## 2021-02-13 DIAGNOSIS — E669 Obesity, unspecified: Secondary | ICD-10-CM | POA: Insufficient documentation

## 2021-02-13 LAB — CBC WITH DIFFERENTIAL/PLATELET
Basophils Absolute: 0.1 10*3/uL (ref 0.0–0.1)
Basophils Relative: 1 % (ref 0.0–3.0)
Eosinophils Absolute: 0.2 10*3/uL (ref 0.0–0.7)
Eosinophils Relative: 2.6 % (ref 0.0–5.0)
HCT: 46.6 % — ABNORMAL HIGH (ref 36.0–46.0)
Hemoglobin: 15.7 g/dL — ABNORMAL HIGH (ref 12.0–15.0)
Lymphocytes Relative: 37 % (ref 12.0–46.0)
Lymphs Abs: 2.9 10*3/uL (ref 0.7–4.0)
MCHC: 33.7 g/dL (ref 30.0–36.0)
MCV: 88 fl (ref 78.0–100.0)
Monocytes Absolute: 0.7 10*3/uL (ref 0.1–1.0)
Monocytes Relative: 9.4 % (ref 3.0–12.0)
Neutro Abs: 4 10*3/uL (ref 1.4–7.7)
Neutrophils Relative %: 50 % (ref 43.0–77.0)
Platelets: 244 10*3/uL (ref 150.0–400.0)
RBC: 5.29 Mil/uL — ABNORMAL HIGH (ref 3.87–5.11)
RDW: 12.9 % (ref 11.5–15.5)
WBC: 7.9 10*3/uL (ref 4.0–10.5)

## 2021-02-13 LAB — HEPATIC FUNCTION PANEL
ALT: 24 U/L (ref 0–35)
AST: 19 U/L (ref 0–37)
Albumin: 4.3 g/dL (ref 3.5–5.2)
Alkaline Phosphatase: 54 U/L (ref 39–117)
Bilirubin, Direct: 0.1 mg/dL (ref 0.0–0.3)
Total Bilirubin: 0.7 mg/dL (ref 0.2–1.2)
Total Protein: 7.2 g/dL (ref 6.0–8.3)

## 2021-02-13 LAB — HEMOGLOBIN A1C: Hgb A1c MFr Bld: 5.7 % (ref 4.6–6.5)

## 2021-02-13 LAB — LIPID PANEL
Cholesterol: 179 mg/dL (ref 0–200)
HDL: 50.4 mg/dL (ref >39.00)
LDL Cholesterol: 106 mg/dL — ABNORMAL HIGH (ref 0–99)
NonHDL: 128.23
Total CHOL/HDL Ratio: 4
Triglycerides: 112 mg/dL (ref 0.0–149.0)
VLDL: 22.4 mg/dL (ref 0.0–40.0)

## 2021-02-13 LAB — BASIC METABOLIC PANEL
BUN: 15 mg/dL (ref 6–23)
CO2: 29 mEq/L (ref 19–32)
Calcium: 9.6 mg/dL (ref 8.4–10.5)
Chloride: 103 mEq/L (ref 96–112)
Creatinine, Ser: 0.91 mg/dL (ref 0.40–1.20)
GFR: 70.92 mL/min (ref >60.00)
Glucose, Bld: 85 mg/dL (ref 70–99)
Potassium: 4 mEq/L (ref 3.5–5.1)
Sodium: 139 mEq/L (ref 135–145)

## 2021-02-13 LAB — URINALYSIS, ROUTINE W REFLEX MICROSCOPIC
Bilirubin Urine: NEGATIVE
Ketones, ur: NEGATIVE
Leukocytes,Ua: NEGATIVE
Nitrite: NEGATIVE
Specific Gravity, Urine: 1.01 (ref 1.000–1.030)
Total Protein, Urine: NEGATIVE
Urine Glucose: NEGATIVE
Urobilinogen, UA: 2 — AB (ref 0.0–1.0)
pH: 6.5 (ref 5.0–8.0)

## 2021-02-13 LAB — VITAMIN D 25 HYDROXY (VIT D DEFICIENCY, FRACTURES): VITD: 39.05 ng/mL (ref 30.00–100.00)

## 2021-02-13 LAB — TSH: TSH: 2.04 u[IU]/mL (ref 0.35–4.50)

## 2021-02-13 LAB — VITAMIN B12: Vitamin B-12: 767 pg/mL (ref 211–911)

## 2021-02-13 MED ORDER — ZOSTER VAC RECOMB ADJUVANTED 50 MCG/0.5ML IM SUSR: 0.5000 mL | Freq: Once | INTRAMUSCULAR | 1 refills | Status: AC

## 2021-02-13 MED ORDER — LOSARTAN POTASSIUM 25 MG PO TABS: 25.0000 mg | ORAL_TABLET | Freq: Every day | ORAL | 3 refills | Status: DC

## 2021-02-13 NOTE — Assessment & Plan Note (Signed)
Lab Results  Component Value Date   VITAMINB12 750 02/08/2020   Stable, cont oral replacement - b12 1000 mcg qd

## 2021-02-13 NOTE — Patient Instructions (Addendum)
You shingels shot was sent to pharmacy to see if it is covered  You will be contacted regarding the referral for: Gastroenterology  Please take all new medication as prescribed - the very low dose losartan 25 mg for blood pressure  Please continue all other medications as before, and refills have been done if requested.  Please have the pharmacy call with any other refills you may need.  Please continue your efforts at being more active, low cholesterol diet, and weight control.  You are otherwise up to date with prevention measures today.  Please keep your appointments with your specialists as you may have planned  Please make an Appointment to return in 6 months, or sooner if needed

## 2021-02-13 NOTE — Progress Notes (Signed)
Patient ID: Pamela Gibson, female   DOB: [birth year], 55 y.o.   MRN: [id number]         Chief Complaint:: wellness exam and Follow-up  Low vit d, b12, hyperglycemia, hld        HPI:  Pamela Gibson is a 55 y.o. female here for wellness exam; plans to get shingrix at pharmacy; o/w up to date with preventive referrals and immunizations                        Also has not been taking vit d recently , has seen Dr Zigmund Daniel with central vein occlusion.  Mult BP at home several times per day seem to average sbp in the 120s and 130s. Has lost some vision, has been getting shots monthly to the eye, now at every 8 wks. Has occasional low moods but only 20 min and gets herself out of her lows.  Declines tx.  Has been seeing GI at [facility] for years but insurance no longer covers , has ongoing gastritis and due for colonoscopy soon   Pt denies chest pain, increased sob or doe, wheezing, orthopnea, PND, increased LE swelling, palpitations, dizziness or syncope.   Pt denies polydipsia, polyuria, or new focal neuro s/s.     Wt Readings from Last 3 Encounters:  02/13/21 219 lb (99.3 kg)  10/22/20 216 lb 0.8 oz (98 kg)  08/10/20 216 lb (98 kg)   BP Readings from Last 3 Encounters:  02/13/21 120/82  10/22/20 (!) 142/81  10/22/20 138/78   Immunization History  Administered Date(s) Administered   Influenza Inj Mdck Quad Pf 04/18/2018   Influenza Whole 07/23/2007, 08/02/2009   Influenza,inj,Quad PF,6+ Mos 04/17/2017, 04/20/2019, 05/05/2020   Influenza-Unspecified 05/31/2015, 05/28/2016, 05/17/2017, 04/20/2019, 05/05/2020   PFIZER(Purple Top)SARS-COV-2 Vaccination 12/03/2019, 12/26/2019, 08/10/2020   PPD Test 03/21/2013, 03/21/2014   Td 08/26/2003   Tdap 12/02/2013   There are no preventive care reminders to display for this patient.     Past Medical History:  Diagnosis Date   Anxiety    BACK PAIN 07/23/2007   CHEST PAIN 09/04/2010   Cyst of right ovary 07/03/2015   DEGENERATIVE JOINT  DISEASE, LUMBAR SPINE 07/23/2007   Depression    Gastric polyp    x3   GERD 07/22/2007   H/O total hysterectomy    HYPERLIPIDEMIA 07/22/2007   IBS 07/23/2007   MITRAL VALVE PROLAPSE 07/22/2007   Pinched nerve    LEFT SIDE, DISC 3,4,5   Pneumonia    YEARS AGO   SINUSITIS- ACUTE-NOS 09/09/2007   TACHYCARDIA 08/20/2010   Tuberculosis     MOTHER HAD YEARS AGO   Unspecified Vaginitis and Vulvovaginitis 12/17/2007   Past Surgical History:  Procedure Laterality Date   ABDOMINAL HYSTERECTOMY     CHOLECYSTECTOMY     CYSTOSCOPY  11/11/2017   Procedure: CYSTOSCOPY;  Surgeon: Janyth Pupa, DO;  Location: [facility];  Service: Gynecology;;   LAPAROSCOPIC BILATERAL SALPINGO OOPHERECTOMY Bilateral 11/11/2017   Procedure: LAPAROSCOPIC BILATERAL SALPINGO OOPHORECTOMY;  Surgeon: Janyth Pupa, DO;  Location: [facility];  Service: Gynecology;  Laterality: Bilateral;   RLE tib/fib fx/surgury     TONSILLECTOMY     TUBAL LIGATION      reports that she has never smoked. She has never used smokeless tobacco. She reports that she does not drink alcohol and does not use drugs. family history includes Angina in her mother; Colon polyps in her mother; Coronary artery disease in her mother; Diabetes  in an other family member; Heart disease in her mother; Hyperlipidemia in an other family member; Hypertension in an other family member; Stroke in her mother. Allergies  Allergen Reactions   Atorvastatin     REACTION: more forgetful   Ibuprofen     INSTRUCTED BY MD NOT TO TAKE DUE TO GI UPSET   Morphine Sulfate Er Other (See Comments)   Penicillin G Other (See Comments)   Penicillins     turns blue, childhood allergy Has patient had a PCN reaction causing immediate rash, facial/tongue/throat swelling, SOB or lightheadedness with hypotension: Yes Has patient had a PCN reaction causing severe rash involving mucus membranes or skin necrosis: No Has patient had a PCN reaction that required hospitalization: Unknown   Has patient had a PCN reaction occurring within the last 10 years: No If all of the above answers are "NO", then may proceed with Cephalosporin use.    Simvastatin     REACTION: more forgetful   Tetracycline     REACTION: itch   Tetracycline Hcl Other (See Comments)   Current Outpatient Medications on File Prior to Visit  Medication Sig Dispense Refill   ALPRAZolam (XANAX) 1 MG tablet TAKE 1 TABLET BY MOUTH THREE TIMES A DAY AS NEEDED 90 tablet 2   aspirin 81 MG EC tablet Take 81 mg by mouth daily.     calcium carbonate (TUMS - DOSED IN MG ELEMENTAL CALCIUM) 500 MG chewable tablet Chew 1-2 tablets by mouth daily as needed for indigestion or heartburn.     COVID-19 mRNA vaccine, Pfizer, 30 MCG/0.3ML injection INJECT AS DIRECTED .3 mL 0   [employer] PURELAX 17 GM/SCOOP powder SMARTSIG:17 Gram(s) By Mouth Daily PRN     oxyCODONE-acetaminophen (PERCOCET) 10-325 MG tablet Take 1 tablet by mouth every 4 (four) hours as needed for pain.     pantoprazole (PROTONIX) 40 MG tablet Take 40 mg by mouth daily.     polyethylene glycol (MIRALAX / GLYCOLAX) packet Take 17 g by mouth daily as needed.     pravastatin (PRAVACHOL) 40 MG tablet TAKE 1 TABLET BY MOUTH DAILY 90 tablet 3   vitamin B-12 (CYANOCOBALAMIN) 1000 MCG tablet Take 1 tablet (1,000 mcg total) by mouth daily. 90 tablet 3   NARCAN 4 MG/0.1ML LIQD nasal spray kit SMARTSIG:1 Spray(s) Both Nares 1-2 Times Daily PRN (Patient not taking: Reported on 02/13/2021)     No current facility-administered medications on file prior to visit.        ROS:  All others reviewed and negative.  Objective        PE:  BP 120/82 (BP Location: Left Arm, Patient Position: Sitting, Cuff Size: Large)   Pulse 63   Temp 97.7 F (36.5 C) (Oral)   Ht $R'5\' 5"'Xv$  (1.651 m)   Wt 219 lb (99.3 kg)   SpO2 99%   BMI 36.44 kg/m                 Constitutional: Pt appears in NAD               HENT: Head: NCAT.                Right Ear: External ear normal.                 Left  Ear: External ear normal.                Eyes: . Pupils are equal, round, and reactive to light. Conjunctivae and EOM are  normal               Nose: without d/c or deformity               Neck: Neck supple. Gross normal ROM               Cardiovascular: Normal rate and regular rhythm.                 Pulmonary/Chest: Effort normal and breath sounds without rales or wheezing.                Abd:  Soft, NT, ND, + BS, no organomegaly               Neurological: Pt is alert. At baseline orientation, motor grossly intact               Skin: Skin is warm. No rashes, no other new lesions, LE edema - none               Psychiatric: Pt behavior is normal without agitation   Micro: none  Cardiac tracings I have personally interpreted today:  none  Pertinent Radiological findings (summarize): none   Lab Results  Component Value Date   WBC 7.9 02/13/2021   HGB 15.7 (H) 02/13/2021   HCT 46.6 (H) 02/13/2021   PLT 244.0 02/13/2021   GLUCOSE 85 02/13/2021   CHOL 179 02/13/2021   TRIG 112.0 02/13/2021   HDL 50.40 02/13/2021   LDLDIRECT 128.8 07/18/2008   LDLCALC 106 (H) 02/13/2021   ALT 24 02/13/2021   AST 19 02/13/2021   NA 139 02/13/2021   K 4.0 02/13/2021   CL 103 02/13/2021   CREATININE 0.91 02/13/2021   BUN 15 02/13/2021   CO2 29 02/13/2021   TSH 2.04 02/13/2021   HGBA1C 5.7 02/13/2021   MICROALBUR <0.7 08/10/2019   Assessment/Plan:  Pamela Gibson is a 55 y.o. White or Caucasian [1] female with  has a past medical history of Anxiety, BACK PAIN (07/23/2007), CHEST PAIN (09/04/2010), Cyst of right ovary (07/03/2015), DEGENERATIVE JOINT DISEASE, LUMBAR SPINE (07/23/2007), Depression, Gastric polyp, GERD (07/22/2007), H/O total hysterectomy, HYPERLIPIDEMIA (07/22/2007), IBS (07/23/2007), MITRAL VALVE PROLAPSE (07/22/2007), Pinched nerve, Pneumonia, SINUSITIS- ACUTE-NOS (09/09/2007), TACHYCARDIA (08/20/2010), Tuberculosis, and Unspecified Vaginitis and Vulvovaginitis (12/17/2007).  Vitamin  B12 deficiency Lab Results  Component Value Date   VITAMINB12 750 02/08/2020   Stable, cont oral replacement - b12 1000 mcg qd   Vitamin D deficiency Last vitamin D Lab Results  Component Value Date   VD25OH 39.41 02/08/2020   Low normal, to restart oral replacement   Encounter for well adult exam with abnormal findings Age and sex appropriate education and counseling updated with regular exercise and diet Referrals for preventative services - none needed Immunizations addressed  - for shingrix at pharmay Smoking counseling  - none needed Evidence for depression or other mood disorder - none significant Most recent labs reviewed. I have personally reviewed and have noted: 1) the patient's medical and social history 2) The patient's current medications and supplements 3) The patient's height, weight, and BMI have been recorded in the chart   Disorder of function of stomach Ongoing issue , cont same tx, and refer local GI  Hyperglycemia Lab Results  Component Value Date   HGBA1C 5.7 02/13/2021   Stable, pt to continue current medical treatment  - diet   Hyperlipidemia Lab Results  Component Value Date   LDLCALC 106 (H) 02/13/2021   Mild uncontrolled, goal  ldl < 100, pt to continue current statin paravstatin as pt declines increase   Aortic atherosclerosis ([place]) To continue statin, low chol diet, excercise  Followup: Return in about 6 months (around 08/15/2021).  Cathlean Cower, MD 02/17/2021 8:26 PM [facility]

## 2021-02-13 NOTE — Assessment & Plan Note (Signed)
Last vitamin D Lab Results  Component Value Date   VD25OH 39.41 02/08/2020   Low normal, to restart oral replacement

## 2021-02-14 ENCOUNTER — Encounter (INDEPENDENT_AMBULATORY_CARE_PROVIDER_SITE_OTHER): Payer: 59 | Admitting: Ophthalmology

## 2021-02-14 ENCOUNTER — Other Ambulatory Visit: Payer: Self-pay

## 2021-02-14 DIAGNOSIS — H348112 Central retinal vein occlusion, right eye, stable: Secondary | ICD-10-CM

## 2021-02-14 DIAGNOSIS — I10 Essential (primary) hypertension: Secondary | ICD-10-CM

## 2021-02-14 DIAGNOSIS — H43813 Vitreous degeneration, bilateral: Secondary | ICD-10-CM

## 2021-02-14 DIAGNOSIS — H35033 Hypertensive retinopathy, bilateral: Secondary | ICD-10-CM | POA: Diagnosis not present

## 2021-02-17 ENCOUNTER — Encounter: Payer: Self-pay | Admitting: Internal Medicine

## 2021-02-17 NOTE — Assessment & Plan Note (Signed)
Lab Results  Component Value Date   LDLCALC 106 (H) 02/13/2021   Mild uncontrolled, goal ldl < 100, pt to continue current statin paravstatin as pt declines increase

## 2021-02-17 NOTE — Assessment & Plan Note (Signed)
To continue statin, low chol diet, excercise

## 2021-02-17 NOTE — Assessment & Plan Note (Signed)
Lab Results  Component Value Date   HGBA1C 5.7 02/13/2021   Stable, pt to continue current medical treatment  - diet

## 2021-02-17 NOTE — Assessment & Plan Note (Signed)
Age and sex appropriate education and counseling updated with regular exercise and diet Referrals for preventative services - none needed Immunizations addressed  - for shingrix at pharmay Smoking counseling  - none needed Evidence for depression or other mood disorder - none significant Most recent labs reviewed. I have personally reviewed and have noted: 1) the patient's medical and social history 2) The patient's current medications and supplements 3) The patient's height, weight, and BMI have been recorded in the chart

## 2021-02-17 NOTE — Assessment & Plan Note (Signed)
Ongoing issue , cont same tx, and refer local GI

## 2021-02-28 ENCOUNTER — Other Ambulatory Visit: Payer: Self-pay | Admitting: Internal Medicine

## 2021-03-28 ENCOUNTER — Telehealth: Payer: Self-pay | Admitting: Internal Medicine

## 2021-03-28 NOTE — Telephone Encounter (Signed)
Yes, this is TOO low  Ok to stop the losartan 25 mg even though it is a very small dose  Please continue to monitor the BP with the goal being the top number about 110-120 or so at least most of the time (it can be lower or higher sometimes)

## 2021-03-28 NOTE — Telephone Encounter (Signed)
Patient called in regarding BP  Patient says she checked BP last night & it was 100/60 , 100/66 early am , & 100/64 when she checked around 11am  Wants to know if this is in normal range or if its too high/low.. patient says she has jus started on new BP meds   Req callback [phone]

## 2021-03-29 NOTE — Telephone Encounter (Signed)
Patient notified to stop losartan an to monitor the b/p.

## 2021-04-18 ENCOUNTER — Other Ambulatory Visit: Payer: Self-pay

## 2021-04-18 ENCOUNTER — Encounter (INDEPENDENT_AMBULATORY_CARE_PROVIDER_SITE_OTHER): Payer: 59 | Admitting: Ophthalmology

## 2021-04-18 DIAGNOSIS — H2513 Age-related nuclear cataract, bilateral: Secondary | ICD-10-CM

## 2021-04-18 DIAGNOSIS — H43813 Vitreous degeneration, bilateral: Secondary | ICD-10-CM | POA: Diagnosis not present

## 2021-04-18 DIAGNOSIS — I10 Essential (primary) hypertension: Secondary | ICD-10-CM

## 2021-04-18 DIAGNOSIS — H35033 Hypertensive retinopathy, bilateral: Secondary | ICD-10-CM

## 2021-04-18 DIAGNOSIS — H348112 Central retinal vein occlusion, right eye, stable: Secondary | ICD-10-CM

## 2021-04-24 ENCOUNTER — Other Ambulatory Visit: Payer: Self-pay

## 2021-04-24 ENCOUNTER — Encounter: Payer: Self-pay | Admitting: Internal Medicine

## 2021-04-24 ENCOUNTER — Ambulatory Visit (INDEPENDENT_AMBULATORY_CARE_PROVIDER_SITE_OTHER): Payer: 59 | Admitting: Internal Medicine

## 2021-04-24 VITALS — BP 128/76 | HR 61 | Ht 65.0 in | Wt 216.0 lb

## 2021-04-24 DIAGNOSIS — R739 Hyperglycemia, unspecified: Secondary | ICD-10-CM

## 2021-04-24 DIAGNOSIS — R202 Paresthesia of skin: Secondary | ICD-10-CM | POA: Diagnosis not present

## 2021-04-24 DIAGNOSIS — E7800 Pure hypercholesterolemia, unspecified: Secondary | ICD-10-CM

## 2021-04-24 DIAGNOSIS — E538 Deficiency of other specified B group vitamins: Secondary | ICD-10-CM | POA: Diagnosis not present

## 2021-04-24 DIAGNOSIS — E559 Vitamin D deficiency, unspecified: Secondary | ICD-10-CM | POA: Diagnosis not present

## 2021-04-24 NOTE — Progress Notes (Signed)
Patient ID: Pamela Gibson, female   DOB: [birth year], 55 y.o.   MRN: [id number]        Chief Complaint: follow up HTN, HLD and hyperglycemia, and right hand numbness       HPI:  Pamela Gibson is a 55 y.o. female here with c/o onset numbness since aug 20 first noticed with sitting at church social at the picnic table, seemed to involve numbness without significant pain or weakness to the right hand and arm to the elbow for no apparent reason, and denies overuse, swelling, wt gain, recent thyroid or b12 changes or trauma.  Since then it has improve over but most of the nunbness now focused at the 4th and 5th fingers of the right hand that seems to wax and wane in severity.  chest pain   Pt denies polydipsia, polyuria, or new focal neuro s/s.  Admits to not taking her statin every day, just forgets as she is supposed to take in the evenings.  Did also have predominantly lower BP with weakness with sbp oftern 100 or less even with only losartan 25 mg so had to stop this and symptom resolved.   Pt denies fever, wt loss, night sweats, loss of appetite, or other constitutional symptoms.   Taking b12, and also taking vit d 1000 u qd       Wt Readings from Last 3 Encounters:  04/24/21 216 lb (98 kg)  02/13/21 219 lb (99.3 kg)  10/22/20 216 lb 0.8 oz (98 kg)   BP Readings from Last 3 Encounters:  04/24/21 128/76  02/13/21 120/82  10/22/20 (!) 142/81         Past Medical History:  Diagnosis Date   Anxiety    BACK PAIN 07/23/2007   CHEST PAIN 09/04/2010   Cyst of right ovary 07/03/2015   DEGENERATIVE JOINT DISEASE, LUMBAR SPINE 07/23/2007   Depression    Gastric polyp    x3   GERD 07/22/2007   H/O total hysterectomy    HYPERLIPIDEMIA 07/22/2007   IBS 07/23/2007   MITRAL VALVE PROLAPSE 07/22/2007   Pinched nerve    LEFT SIDE, DISC 3,4,5   Pneumonia    YEARS AGO   SINUSITIS- ACUTE-NOS 09/09/2007   TACHYCARDIA 08/20/2010   Tuberculosis     MOTHER HAD YEARS AGO   Unspecified Vaginitis and  Vulvovaginitis 12/17/2007   Past Surgical History:  Procedure Laterality Date   ABDOMINAL HYSTERECTOMY     CHOLECYSTECTOMY     CYSTOSCOPY  11/11/2017   Procedure: CYSTOSCOPY;  Surgeon: Janyth Pupa, DO;  Location: [facility];  Service: Gynecology;;   LAPAROSCOPIC BILATERAL SALPINGO OOPHERECTOMY Bilateral 11/11/2017   Procedure: LAPAROSCOPIC BILATERAL SALPINGO OOPHORECTOMY;  Surgeon: Janyth Pupa, DO;  Location: [facility];  Service: Gynecology;  Laterality: Bilateral;   RLE tib/fib fx/surgury     TONSILLECTOMY     TUBAL LIGATION      reports that she has never smoked. She has never used smokeless tobacco. She reports that she does not drink alcohol and does not use drugs. family history includes Angina in her mother; Colon polyps in her mother; Coronary artery disease in her mother; Diabetes in an other family member; Heart disease in her mother; Hyperlipidemia in an other family member; Hypertension in an other family member; Stroke in her mother. Allergies  Allergen Reactions   Atorvastatin     REACTION: more forgetful   Ibuprofen     INSTRUCTED BY MD NOT TO TAKE DUE TO GI UPSET   Morphine  Sulfate Er Other (See Comments)   Penicillin G Other (See Comments)   Penicillins     turns blue, childhood allergy Has patient had a PCN reaction causing immediate rash, facial/tongue/throat swelling, SOB or lightheadedness with hypotension: Yes Has patient had a PCN reaction causing severe rash involving mucus membranes or skin necrosis: No Has patient had a PCN reaction that required hospitalization: Unknown  Has patient had a PCN reaction occurring within the last 10 years: No If all of the above answers are "NO", then may proceed with Cephalosporin use.    Simvastatin     REACTION: more forgetful   Tetracycline     REACTION: itch   Tetracycline Hcl Other (See Comments)   Current Outpatient Medications on File Prior to Visit  Medication Sig Dispense Refill   ALPRAZolam (XANAX) 1 MG tablet  TAKE 1 TABLET BY MOUTH THREE TIMES A DAY AS NEEDED 90 tablet 2   aspirin 81 MG EC tablet Take 81 mg by mouth daily.     calcium carbonate (TUMS - DOSED IN MG ELEMENTAL CALCIUM) 500 MG chewable tablet Chew 1-2 tablets by mouth daily as needed for indigestion or heartburn.     [employer] PURELAX 17 GM/SCOOP powder SMARTSIG:17 Gram(s) By Mouth Daily PRN     NARCAN 4 MG/0.1ML LIQD nasal spray kit      oxyCODONE-acetaminophen (PERCOCET) 10-325 MG tablet Take 1 tablet by mouth every 4 (four) hours as needed for pain.     pantoprazole (PROTONIX) 40 MG tablet Take 40 mg by mouth daily.     polyethylene glycol (MIRALAX / GLYCOLAX) packet Take 17 g by mouth daily as needed.     pravastatin (PRAVACHOL) 40 MG tablet TAKE 1 TABLET BY MOUTH DAILY 90 tablet 3   vitamin B-12 (CYANOCOBALAMIN) 1000 MCG tablet Take 1 tablet (1,000 mcg total) by mouth daily. 90 tablet 3   No current facility-administered medications on file prior to visit.        ROS:  All others reviewed and negative.  Objective        PE:  BP 128/76 (BP Location: Right Arm, Patient Position: Sitting, Cuff Size: Large)   Pulse 61   Ht $R'5\' 5"'aN$  (1.651 m)   Wt 216 lb (98 kg)   SpO2 99%   BMI 35.94 kg/m                 Constitutional: Pt appears in NAD               HENT: Head: NCAT.                Right Ear: External ear normal.                 Left Ear: External ear normal.                Eyes: . Pupils are equal, round, and reactive to light. Conjunctivae and EOM are normal               Nose: without d/c or deformity               Neck: Neck supple. Gross normal ROM               Cardiovascular: Normal rate and regular rhythm.                 Pulmonary/Chest: Effort normal and breath sounds without rales or wheezing.  Abd:  Soft, NT, ND, + BS, no organomegaly               Neurological: Pt is alert. At baseline orientation, motor grossly intact to all extremities, but has decreaed sens to LT to 4th and 5th fingers right hand                Skin: Skin is warm. No rashes, no other new lesions, LE edema - none               Psychiatric: Pt behavior is normal without agitation   Micro: none  Cardiac tracings I have personally interpreted today:  none  Pertinent Radiological findings (summarize): none   Lab Results  Component Value Date   WBC 7.9 02/13/2021   HGB 15.7 (H) 02/13/2021   HCT 46.6 (H) 02/13/2021   PLT 244.0 02/13/2021   GLUCOSE 85 02/13/2021   CHOL 179 02/13/2021   TRIG 112.0 02/13/2021   HDL 50.40 02/13/2021   LDLDIRECT 128.8 07/18/2008   LDLCALC 106 (H) 02/13/2021   ALT 24 02/13/2021   AST 19 02/13/2021   NA 139 02/13/2021   K 4.0 02/13/2021   CL 103 02/13/2021   CREATININE 0.91 02/13/2021   BUN 15 02/13/2021   CO2 29 02/13/2021   TSH 2.04 02/13/2021   HGBA1C 5.7 02/13/2021   MICROALBUR <0.7 08/10/2019   Assessment/Plan:  MICHAELAH CREDEUR is a 55 y.o. White or Caucasian [1] female with  has a past medical history of Anxiety, BACK PAIN (07/23/2007), CHEST PAIN (09/04/2010), Cyst of right ovary (07/03/2015), DEGENERATIVE JOINT DISEASE, LUMBAR SPINE (07/23/2007), Depression, Gastric polyp, GERD (07/22/2007), H/O total hysterectomy, HYPERLIPIDEMIA (07/22/2007), IBS (07/23/2007), MITRAL VALVE PROLAPSE (07/22/2007), Pinched nerve, Pneumonia, SINUSITIS- ACUTE-NOS (09/09/2007), TACHYCARDIA (08/20/2010), Tuberculosis, and Unspecified Vaginitis and Vulvovaginitis (12/17/2007).  Vitamin D deficiency Last vitamin D Lab Results  Component Value Date   VD25OH 39.05 02/13/2021   Low normal, to increase oral replacement from 1000 u to 2000 u qd   Vitamin B12 deficiency Lab Results  Component Value Date   [id number] 076 02/13/2021   Stable, cont oral replacement - b12 1000 mcg qd   Hyperglycemia Lab Results  Component Value Date   HGBA1C 5.7 02/13/2021   Stable, pt to continue current medical treatment  - diet   Paresthesias in right hand This is c/w carpal tunnel vs ulnar neuritis, overall  mild, etiology unclear, for now to start with qhs right wrist splint, but consider NCS/EMG and /or hand surgury if not improved or worsening in the next 1-2 wks  Hyperlipidemia Lab Results  Component Value Date   LDLCALC 106 (H) 02/13/2021   Uncontrolled,, goal ldl < 100,  pt to continue current statin pravachol 40 with better med compliance, lower chol diet  Followup: Return in about 4 months (around 08/15/2021).  Cathlean Cower, MD 04/24/2021 9:16 AM [facility]

## 2021-04-24 NOTE — Assessment & Plan Note (Signed)
This is c/w carpal tunnel vs ulnar neuritis, overall mild, etiology unclear, for now to start with qhs right wrist splint, but consider NCS/EMG and /or hand surgury if not improved or worsening in the next 1-2 wks

## 2021-04-24 NOTE — Assessment & Plan Note (Addendum)
Last vitamin D Lab Results  Component Value Date   VD25OH 39.05 02/13/2021   Low normal, to increase oral replacement from 1000 u to 2000 u qd

## 2021-04-24 NOTE — Assessment & Plan Note (Signed)
Lab Results  Component Value Date   [id number] 02/13/2021   Stable, cont oral replacement - b12 1000 mcg qd

## 2021-04-24 NOTE — Assessment & Plan Note (Signed)
Lab Results  Component Value Date   LDLCALC 106 (H) 02/13/2021   Uncontrolled,, goal ldl < 100,  pt to continue current statin pravachol 40 with better med compliance, lower chol diet

## 2021-04-24 NOTE — Patient Instructions (Signed)
You appear to have mild right handed Carpal Tunnel Syndrome;  please start with a wrist splint at night only as this helps take the pressure off the nerves in the wrist  If not better, or getting worse with pain or weakness, please call for referral to Hand Surgury  Please remember to take your cholesterol medication every day  We can continue to STOP the losartan as you have  Please continue all other medications as before, and refills have been done if requested.  Please have the pharmacy call with any other refills you may need.  Please continue your efforts at being more active, low cholesterol diet, and weight control.  Please keep your appointments with your specialists as you may have planned

## 2021-04-24 NOTE — Assessment & Plan Note (Signed)
Lab Results  Component Value Date   HGBA1C 5.7 02/13/2021   Stable, pt to continue current medical treatment  - diet

## 2021-05-02 ENCOUNTER — Encounter: Payer: Self-pay | Admitting: Internal Medicine

## 2021-05-23 ENCOUNTER — Other Ambulatory Visit: Payer: Self-pay | Admitting: Internal Medicine

## 2021-05-23 DIAGNOSIS — Z1231 Encounter for screening mammogram for malignant neoplasm of breast: Secondary | ICD-10-CM

## 2021-05-27 ENCOUNTER — Encounter: Payer: Self-pay | Admitting: Internal Medicine

## 2021-05-27 ENCOUNTER — Ambulatory Visit: Payer: 59 | Admitting: Internal Medicine

## 2021-05-27 VITALS — BP 116/78 | HR 84 | Ht 65.0 in | Wt 218.4 lb

## 2021-05-27 DIAGNOSIS — K317 Polyp of stomach and duodenum: Secondary | ICD-10-CM

## 2021-05-27 DIAGNOSIS — Z8719 Personal history of other diseases of the digestive system: Secondary | ICD-10-CM | POA: Diagnosis not present

## 2021-05-27 DIAGNOSIS — K635 Polyp of colon: Secondary | ICD-10-CM

## 2021-05-27 NOTE — Patient Instructions (Addendum)
We will try to obtain your medical records for [facility]  If you are age 56 or older, your body mass index should be between 23-30. Your Body mass index is 36.34 kg/m. If this is out of the aforementioned range listed, please consider follow up with your Primary Care Provider.  If you are age 77 or younger, your body mass index should be between 19-25. Your Body mass index is 36.34 kg/m. If this is out of the aformentioned range listed, please consider follow up with your Primary Care Provider.   __________________________________________________________  The [facility] providers would like to encourage you to use [facility] to communicate with providers for non-urgent requests or questions.  Due to long hold times on the telephone, sending your provider a message by [facility] may be a faster and more efficient way to get a response.  Please allow 48 business hours for a response.  Please remember that this is for non-urgent requests.    I appreciate the  opportunity to care for you  Thank You   Ying Dorsey,MD.

## 2021-05-27 NOTE — Progress Notes (Signed)
Chief Complaint: Gastric polyps, Barrett's esophagus, colon polyp  HPI : 55 year old female with history of arthritis on opioids, possible Barrett's esophagus, and colon polyp presents with Barrett's esophagus, stomach polyps, and colon polyp  Patient presents to establish with a new GI provider since her previous GI provider no longer takes her insurance.  She previously followed with [facility] for her GI services. Her last colonoscopy was approximately 4 years ago that showed one precancerous polyp. She thinks that she is due for a repeat colonoscopy this year or next year. Her last EGD was approximately 3 years ago. She recalls that she has previously been diagnosed with Barrett's esophagus, gastric polyps, and gastritis. She has also had to have esophageal dilation in the past. Denies hematochezia, melena, weight loss, changes in bowel habits, dysphagia, odynophagia, vomiting, chest burning, regurgitation, abdominal pain. She is on pantoprazole 40 mg QD for her stomach. She does occasionally feel a burning sensation in her stomach, though it has bene quite some time since this has been the case. Denies fam hx of GI cancers.    Past Medical History:  Diagnosis Date   Anxiety    BACK PAIN 07/23/2007   Barrett's esophagus    CHEST PAIN 09/04/2010   Colon adenoma    Cyst of right ovary 07/03/2015   DEGENERATIVE JOINT DISEASE, LUMBAR SPINE 07/23/2007   Depression    Gastric polyp    x3   GERD 07/22/2007   H/O total hysterectomy    HYPERLIPIDEMIA 07/22/2007   IBS 07/23/2007   MITRAL VALVE PROLAPSE 07/22/2007   Pinched nerve    LEFT SIDE, DISC 3,4,5   Pneumonia    YEARS AGO   SINUSITIS- ACUTE-NOS 09/09/2007   TACHYCARDIA 08/20/2010   Tuberculosis     MOTHER HAD YEARS AGO   Unspecified Vaginitis and Vulvovaginitis 12/17/2007     Past Surgical History:  Procedure Laterality Date   ABDOMINAL HYSTERECTOMY     CHOLECYSTECTOMY     CYSTOSCOPY  11/11/2017   Procedure:  CYSTOSCOPY;  Surgeon: Janyth Pupa, DO;  Location: [facility];  Service: Gynecology;;   LAPAROSCOPIC BILATERAL SALPINGO OOPHERECTOMY Bilateral 11/11/2017   Procedure: LAPAROSCOPIC BILATERAL SALPINGO OOPHORECTOMY;  Surgeon: Janyth Pupa, DO;  Location: [facility];  Service: Gynecology;  Laterality: Bilateral;   RLE tib/fib fx/surgury     TONSILLECTOMY     TUBAL LIGATION     Family History  Problem Relation Age of Onset   Colon polyps Mother    Angina Mother    Heart disease Mother        CVA   Coronary artery disease Mother    Stroke Mother    Hypertension Other    Diabetes Other    Hyperlipidemia Other    Breast cancer Neg Hx    Esophageal cancer Neg Hx    Colon cancer Neg Hx    Pancreatic cancer Neg Hx    Stomach cancer Neg Hx    Liver disease Neg Hx    Social History   Tobacco Use   Smoking status: Never   Smokeless tobacco: Never  Vaping Use   Vaping Use: Never used  Substance Use Topics   Alcohol use: Yes    Comment: occasional   Drug use: No   Current Outpatient Medications  Medication Sig Dispense Refill   ALPRAZolam (XANAX) 1 MG tablet TAKE 1 TABLET BY MOUTH THREE TIMES A DAY AS NEEDED 90 tablet 2   aspirin 81 MG EC tablet Take 81 mg by mouth  every other day.     calcium carbonate (TUMS - DOSED IN MG ELEMENTAL CALCIUM) 500 MG chewable tablet Chew 1-2 tablets by mouth daily as needed for indigestion or heartburn.     Cholecalciferol (VITAMIN D3) 25 MCG (1000 UT) CAPS Take 2 capsules by mouth daily.     [employer] PURELAX 17 GM/SCOOP powder SMARTSIG:17 Gram(s) By Mouth Daily PRN     NARCAN 4 MG/0.1ML LIQD nasal spray kit      oxyCODONE-acetaminophen (PERCOCET) 10-325 MG tablet Take 1 tablet by mouth every 4 (four) hours as needed for pain.     pantoprazole (PROTONIX) 40 MG tablet Take 40 mg by mouth daily.     pravastatin (PRAVACHOL) 40 MG tablet TAKE 1 TABLET BY MOUTH DAILY 90 tablet 3   vitamin B-12 (CYANOCOBALAMIN) 1000 MCG tablet Take 1 tablet (1,000 mcg total) by mouth  daily. 90 tablet 3   No current facility-administered medications for this visit.   Allergies  Allergen Reactions   Atorvastatin     REACTION: more forgetful   Ibuprofen     INSTRUCTED BY MD NOT TO TAKE DUE TO GI UPSET   Morphine Sulfate Er Other (See Comments)   Penicillin G Other (See Comments)   Penicillins     turns blue, childhood allergy Has patient had a PCN reaction causing immediate rash, facial/tongue/throat swelling, SOB or lightheadedness with hypotension: Yes Has patient had a PCN reaction causing severe rash involving mucus membranes or skin necrosis: No Has patient had a PCN reaction that required hospitalization: Unknown  Has patient had a PCN reaction occurring within the last 10 years: No If all of the above answers are "NO", then may proceed with Cephalosporin use.    Simvastatin     REACTION: more forgetful   Tetracycline     REACTION: itch   Tetracycline Hcl Other (See Comments)     Review of Systems: All systems reviewed and negative except where noted in HPI.   Physical Exam: BP 116/78   Pulse 84   Ht _0  (1.651 m)   Wt 218 lb 6.4 oz (99.1 kg)   BMI 36.34 kg/m  Constitutional: Pleasant,well-developed, female in no acute distress. HEENT: Normocephalic and atraumatic. Conjunctivae are normal. No scleral icterus. Cardiovascular: Normal rate, regular rhythm.  Pulmonary/chest: Effort normal and breath sounds normal. No wheezing, rales or rhonchi. Abdominal: Soft, nondistended, nontender. Bowel sounds active throughout. There are no masses palpable. No hepatomegaly. Extremities: No edema Neurological: Alert and oriented to person place and time. Skin: Skin is warm and dry. No rashes noted. Psychiatric: Normal mood and affect. Behavior is normal.  Labs 01/2021: CBC with elevated Hb of 15.7 and otherwise unremarkable. BMP nml, LFTs nml. TSH nml.  EGD 10/19/06:  Path: Fundic gland polyps  ASSESSMENT AND PLAN: Gastric polyps Colon polyp Possible  Barrett's esophagus Unclear exactly when her prior endoscopies and colonoscopies were performed.  We will attempt to obtain records to be able to corroborate the patient's verbal report of Barrett's esophagus and a precancerous colon polyp. This will determine the timing of her next procedures. - Will obtain records from her previous GI provider  Christia Reading, MD

## 2021-05-28 ENCOUNTER — Other Ambulatory Visit: Payer: Self-pay | Admitting: Internal Medicine

## 2021-06-03 ENCOUNTER — Ambulatory Visit: Payer: 59 | Attending: Internal Medicine

## 2021-06-03 DIAGNOSIS — Z23 Encounter for immunization: Secondary | ICD-10-CM

## 2021-06-03 NOTE — Progress Notes (Signed)
   Covid-19 Vaccination Clinic  Name:  Pamela Gibson    MRN: [id number] DOB: 1966/03/02  06/03/2021  [name] was observed post Covid-19 immunization for 15 minutes without incident. She was provided with Vaccine Information Sheet and instruction to access the V-Safe system.   Ms. Graham was instructed to call 911 with any severe reactions post vaccine: Difficulty breathing  Swelling of face and throat  A fast heartbeat  A bad rash all over body  Dizziness and weakness

## 2021-06-12 ENCOUNTER — Other Ambulatory Visit (HOSPITAL_BASED_OUTPATIENT_CLINIC_OR_DEPARTMENT_OTHER): Payer: Self-pay

## 2021-06-12 MED ORDER — COVID-19MRNA BIVAL VACC PFIZER 30 MCG/0.3ML IM SUSP
INTRAMUSCULAR | 0 refills | Status: DC
  Filled 2021-06-12: qty 0.3, 1d supply, fill #0

## 2021-06-13 ENCOUNTER — Encounter: Payer: Self-pay | Admitting: Internal Medicine

## 2021-06-13 ENCOUNTER — Encounter (INDEPENDENT_AMBULATORY_CARE_PROVIDER_SITE_OTHER): Payer: 59 | Admitting: Ophthalmology

## 2021-06-13 ENCOUNTER — Other Ambulatory Visit: Payer: Self-pay

## 2021-06-13 DIAGNOSIS — H348112 Central retinal vein occlusion, right eye, stable: Secondary | ICD-10-CM | POA: Diagnosis not present

## 2021-06-13 DIAGNOSIS — I10 Essential (primary) hypertension: Secondary | ICD-10-CM | POA: Diagnosis not present

## 2021-06-13 DIAGNOSIS — H35033 Hypertensive retinopathy, bilateral: Secondary | ICD-10-CM

## 2021-06-13 DIAGNOSIS — H43813 Vitreous degeneration, bilateral: Secondary | ICD-10-CM | POA: Diagnosis not present

## 2021-06-13 NOTE — Progress Notes (Signed)
Recalls have been placed for EGD and colon 09/2021. Called pt to make her aware of Dr. Libby Maw recommendations based upon her review of past records. Verbalized acceptance and understanding.

## 2021-06-13 NOTE — Progress Notes (Signed)
Received extensive records from [facility].  EGD 06/09/19: Esophagus: Irregular Z-line at 39 cm with biopsies taken. Hiatal hernia noted.  Stricture at 39 cm from the incisors.  Esophagus was dilated using a 48 Fr savory dilator.  These were taken in the proximal, mid, and distal esophagus for EOE. Stomach: Gastritis.  Biopsied Duodenum: Normal.  Biopsied Pathology: Negative for EOE, negative for celiac, negative for H. pylori, negative for Barrett's, mild gastritis. Per notes, patient's dysphagia improved after this esophageal dilation.  Patient has numerous EGDs (I count at least 7) done between 2012-2020, which showed esophagitis, gastritis, and gastric polyps (all fundic gland polyps by pathology). One EGD in 07/31/2016 did have Barrett's esophagus on pathology seen on biopsies.  Colonoscopy 09/29/16:  One 8 mm polyp in the ascending colon removed with hot forceps. Inadequate prep. Recommended repeat colonoscopy in 5 years. Path showed hyperplastic polyp. Random colon biopsies were negative for microscopic colitis  Colonoscopy 10/01/11: Normal colonoscopy. Adequate prep. Recommended repeat colonoscopy in 5 years.   Based upon these reports, patient should be fine to get repeat colonoscopy in 09/2021 for colon cancer screening since she had a normal colonoscopy done in 2013 with adequate prep. She can get a repeat EGD in 2023 at the same time as her colonoscopy for Barrett's surveillance.  Ammie, please put in recalls for EGD and colonoscopy in 09/2021. Please call the patient to let her know that I have reviewed her records and my recommendations.

## 2021-06-14 ENCOUNTER — Ambulatory Visit (INDEPENDENT_AMBULATORY_CARE_PROVIDER_SITE_OTHER): Payer: 59 | Admitting: Internal Medicine

## 2021-06-14 ENCOUNTER — Encounter: Payer: Self-pay | Admitting: Internal Medicine

## 2021-06-14 VITALS — BP 110/70 | HR 79 | Ht 65.0 in | Wt 219.0 lb

## 2021-06-14 DIAGNOSIS — M7071 Other bursitis of hip, right hip: Secondary | ICD-10-CM

## 2021-06-14 DIAGNOSIS — M545 Low back pain, unspecified: Secondary | ICD-10-CM

## 2021-06-14 DIAGNOSIS — G8929 Other chronic pain: Secondary | ICD-10-CM

## 2021-06-14 DIAGNOSIS — R739 Hyperglycemia, unspecified: Secondary | ICD-10-CM | POA: Diagnosis not present

## 2021-06-14 DIAGNOSIS — R350 Frequency of micturition: Secondary | ICD-10-CM | POA: Diagnosis not present

## 2021-06-14 LAB — URINALYSIS, ROUTINE W REFLEX MICROSCOPIC
Bilirubin Urine: NEGATIVE
Ketones, ur: NEGATIVE
Leukocytes,Ua: NEGATIVE
Nitrite: NEGATIVE
Specific Gravity, Urine: 1.01 (ref 1.000–1.030)
Total Protein, Urine: NEGATIVE
Urine Glucose: NEGATIVE
Urobilinogen, UA: 1 (ref 0.0–1.0)
pH: 6.5 (ref 5.0–8.0)

## 2021-06-14 NOTE — Progress Notes (Signed)
Patient ID: Pamela Gibson, female   DOB: Dec 20, 1965, 55 y.o.   MRN: [id number]        Chief Complaint: follow up urinary frequency, chronic lbp, right hip bursitis mild worse, hyperglycemia,        HPI:  Pamela Gibson is a 55 y.o. female here with c/o 3 days onset urinary frequency, cloudy urine, but Denies urinary symptoms such as dysuria, frequency, urgency, flank pain, hematuria or n/v, fever, chills.  Pt denies chest pain, increased sob or doe, wheezing, orthopnea, PND, increased LE swelling, palpitations, dizziness or syncope.   Pt denies polydipsia, polyuria, or new focal neuro s/s.   Pt denies fever, wt loss, night sweats, loss of appetite, or other constitutional symptoms  Pt continues to have recurring LBP without change in severity, bowel or bladder change, fever, wt loss,  worsening LE pain/numbness/weakness, gait change or falls.  Does also have 1 wk worsening mild to mod constant pain and soreness over the right greater trochanter, worse to lie on at night, better to not do this.   Pt denies fever, wt loss, night sweats, loss of appetite, or other constitutional symptoms  No other new complaints Wt Readings from Last 3 Encounters:  06/14/21 219 lb (99.3 kg)  05/27/21 218 lb 6.4 oz (99.1 kg)  04/24/21 216 lb (98 kg)   BP Readings from Last 3 Encounters:  06/14/21 110/70  05/27/21 116/78  04/24/21 128/76         Past Medical History:  Diagnosis Date   Anxiety    BACK PAIN 07/23/2007   Barrett's esophagus    CHEST PAIN 09/04/2010   Colon adenoma    Cyst of right ovary 07/03/2015   DEGENERATIVE JOINT DISEASE, LUMBAR SPINE 07/23/2007   Depression    Gastric polyp    x3   GERD 07/22/2007   H/O total hysterectomy    HYPERLIPIDEMIA 07/22/2007   IBS 07/23/2007   MITRAL VALVE PROLAPSE 07/22/2007   Pinched nerve    LEFT SIDE, DISC 3,4,5   Pneumonia    YEARS AGO   SINUSITIS- ACUTE-NOS 09/09/2007   TACHYCARDIA 08/20/2010   Tuberculosis     MOTHER HAD YEARS AGO    Unspecified Vaginitis and Vulvovaginitis 12/17/2007   Past Surgical History:  Procedure Laterality Date   ABDOMINAL HYSTERECTOMY     CHOLECYSTECTOMY     CYSTOSCOPY  11/11/2017   Procedure: CYSTOSCOPY;  Surgeon: Janyth Pupa, DO;  Location: [facility];  Service: Gynecology;;   LAPAROSCOPIC BILATERAL SALPINGO OOPHERECTOMY Bilateral 11/11/2017   Procedure: LAPAROSCOPIC BILATERAL SALPINGO OOPHORECTOMY;  Surgeon: Janyth Pupa, DO;  Location: [facility];  Service: Gynecology;  Laterality: Bilateral;   RLE tib/fib fx/surgury     TONSILLECTOMY     TUBAL LIGATION      reports that she has never smoked. She has never used smokeless tobacco. She reports current alcohol use. She reports that she does not use drugs. family history includes Angina in her mother; Colon polyps in her mother; Coronary artery disease in her mother; Diabetes in an other family member; Heart disease in her mother; Hyperlipidemia in an other family member; Hypertension in an other family member; Stroke in her mother. Allergies  Allergen Reactions   Atorvastatin     REACTION: more forgetful   Ibuprofen     INSTRUCTED BY MD NOT TO TAKE DUE TO GI UPSET   Morphine Sulfate Er Other (See Comments)   Penicillin G Other (See Comments)   Penicillins     turns blue, childhood  allergy Has patient had a PCN reaction causing immediate rash, facial/tongue/throat swelling, SOB or lightheadedness with hypotension: Yes Has patient had a PCN reaction causing severe rash involving mucus membranes or skin necrosis: No Has patient had a PCN reaction that required hospitalization: Unknown  Has patient had a PCN reaction occurring within the last 10 years: No If all of the above answers are "NO", then may proceed with Cephalosporin use.    Simvastatin     REACTION: more forgetful   Tetracycline     REACTION: itch   Tetracycline Hcl Other (See Comments)   Current Outpatient Medications on File Prior to Visit  Medication Sig Dispense Refill    ALPRAZolam (XANAX) 1 MG tablet TAKE 1 TABLET BY MOUTH THREE TIMES A DAY AS NEEDED 90 tablet 2   aspirin 81 MG EC tablet Take 81 mg by mouth every other day.     calcium carbonate (TUMS - DOSED IN MG ELEMENTAL CALCIUM) 500 MG chewable tablet Chew 1-2 tablets by mouth daily as needed for indigestion or heartburn.     Cholecalciferol (VITAMIN D3) 25 MCG (1000 UT) CAPS Take 2 capsules by mouth daily.     [employer] PURELAX 17 GM/SCOOP powder SMARTSIG:17 Gram(s) By Mouth Daily PRN     NARCAN 4 MG/0.1ML LIQD nasal spray kit      oxyCODONE-acetaminophen (PERCOCET) 10-325 MG tablet Take 1 tablet by mouth every 4 (four) hours as needed for pain.     pantoprazole (PROTONIX) 40 MG tablet Take 40 mg by mouth daily.     pravastatin (PRAVACHOL) 40 MG tablet TAKE 1 TABLET BY MOUTH DAILY 90 tablet 3   vitamin B-12 (CYANOCOBALAMIN) 1000 MCG tablet Take 1 tablet (1,000 mcg total) by mouth daily. 90 tablet 3   No current facility-administered medications on file prior to visit.        ROS:  All others reviewed and negative.  Objective        PE:  BP 110/70 (BP Location: Right Arm, Patient Position: Sitting, Cuff Size: Large)   Pulse 79   Ht _0  (1.651 m)   Wt 219 lb (99.3 kg)   SpO2 99%   BMI 36.44 kg/m                 Constitutional: Pt appears in NAD               HENT: Head: NCAT.                Right Ear: External ear normal.                 Left Ear: External ear normal.                Eyes: . Pupils are equal, round, and reactive to light. Conjunctivae and EOM are normal               Nose: without d/c or deformity               Neck: Neck supple. Gross normal ROM               Cardiovascular: Normal rate and regular rhythm.                 Pulmonary/Chest: Effort normal and breath sounds without rales or wheezing.                Abd:  Soft, mild low mid abd tender, ND, + BS, no organomegaly  Neurological: Pt is alert. At baseline orientation, motor grossly intact                Right  hip with tender mod over right greater trochanter;  has chronic sore tender midline lumbar               Skin: Skin is warm. No rashes, no other new lesions, LE edema - none               Psychiatric: Pt behavior is normal without agitation   Micro: none  Cardiac tracings I have personally interpreted today:  none  Pertinent Radiological findings (summarize): none   Lab Results  Component Value Date   WBC 7.9 02/13/2021   HGB 15.7 (H) 02/13/2021   HCT 46.6 (H) 02/13/2021   PLT 244.0 02/13/2021   GLUCOSE 85 02/13/2021   CHOL 179 02/13/2021   TRIG 112.0 02/13/2021   HDL 50.40 02/13/2021   LDLDIRECT 128.8 07/18/2008   LDLCALC 106 (H) 02/13/2021   ALT 24 02/13/2021   AST 19 02/13/2021   NA 139 02/13/2021   K 4.0 02/13/2021   CL 103 02/13/2021   CREATININE 0.91 02/13/2021   BUN 15 02/13/2021   CO2 29 02/13/2021   TSH 2.04 02/13/2021   HGBA1C 5.7 02/13/2021   MICROALBUR <0.7 08/10/2019   Assessment/Plan:  Pamela Gibson is a 55 y.o. White or Caucasian [1] female with  has a past medical history of Anxiety, BACK PAIN (07/23/2007), Barrett's esophagus, CHEST PAIN (09/04/2010), Colon adenoma, Cyst of right ovary (07/03/2015), DEGENERATIVE JOINT DISEASE, LUMBAR SPINE (07/23/2007), Depression, Gastric polyp, GERD (07/22/2007), H/O total hysterectomy, HYPERLIPIDEMIA (07/22/2007), IBS (07/23/2007), MITRAL VALVE PROLAPSE (07/22/2007), Pinched nerve, Pneumonia, SINUSITIS- ACUTE-NOS (09/09/2007), TACHYCARDIA (08/20/2010), Tuberculosis, and Unspecified Vaginitis and Vulvovaginitis (12/17/2007).  Chronic low back pain Overall stable pain and neuro status, cont current med tx - percocet   Hyperglycemia Lab Results  Component Value Date   HGBA1C 5.7 02/13/2021   Stable, pt to continue current medical treatment  - diet   Urinary frequency Etiology unclear, for urine studies r/o uti,  to f/u any worsening symptoms or concerns  Bursitis of right hip Mild to mod, pt encouraged for ortho  f/u but demurs as does not want cortisone injection  Followup: No follow-ups on file.  Cathlean Cower, MD 06/15/2021 4:57 PM [facility]

## 2021-06-15 DIAGNOSIS — M7071 Other bursitis of hip, right hip: Secondary | ICD-10-CM | POA: Insufficient documentation

## 2021-06-15 LAB — URINE CULTURE

## 2021-06-15 NOTE — Assessment & Plan Note (Signed)
Lab Results  Component Value Date   HGBA1C 5.7 02/13/2021   Stable, pt to continue current medical treatment  - diet

## 2021-06-15 NOTE — Assessment & Plan Note (Signed)
Mild to mod, pt encouraged for ortho f/u but demurs as does not want cortisone injection

## 2021-06-15 NOTE — Assessment & Plan Note (Signed)
Etiology unclear, for urine studies r/o uti,  to f/u any worsening symptoms or concerns

## 2021-06-15 NOTE — Assessment & Plan Note (Signed)
Overall stable pain and neuro status, cont current med tx - percocet

## 2021-06-16 ENCOUNTER — Encounter: Payer: Self-pay | Admitting: Internal Medicine

## 2021-06-27 ENCOUNTER — Ambulatory Visit: Payer: 59

## 2021-07-02 ENCOUNTER — Ambulatory Visit: Payer: 59

## 2021-07-16 ENCOUNTER — Ambulatory Visit: Payer: 59 | Admitting: Nurse Practitioner

## 2021-07-17 ENCOUNTER — Encounter: Payer: Self-pay | Admitting: Nurse Practitioner

## 2021-07-17 ENCOUNTER — Ambulatory Visit (INDEPENDENT_AMBULATORY_CARE_PROVIDER_SITE_OTHER): Payer: 59 | Admitting: Nurse Practitioner

## 2021-07-17 ENCOUNTER — Other Ambulatory Visit: Payer: Self-pay

## 2021-07-17 VITALS — BP 116/72 | HR 64 | Temp 97.7°F | Resp 14 | Ht 65.0 in | Wt 221.1 lb

## 2021-07-17 DIAGNOSIS — N3001 Acute cystitis with hematuria: Secondary | ICD-10-CM | POA: Diagnosis not present

## 2021-07-17 DIAGNOSIS — R300 Dysuria: Secondary | ICD-10-CM

## 2021-07-17 LAB — POCT URINALYSIS DIP (CLINITEK)
Bilirubin, UA: NEGATIVE
Glucose, UA: NEGATIVE mg/dL
Ketones, POC UA: NEGATIVE mg/dL
Nitrite, UA: NEGATIVE
Spec Grav, UA: 1.02 (ref 1.010–1.025)
Urobilinogen, UA: 1 E.U./dL
pH, UA: 6 (ref 5.0–8.0)

## 2021-07-17 LAB — URINALYSIS, MICROSCOPIC ONLY

## 2021-07-17 MED ORDER — NITROFURANTOIN MONOHYD MACRO 100 MG PO CAPS: 100.0000 mg | ORAL_CAPSULE | Freq: Two times a day (BID) | ORAL | 0 refills | Status: AC

## 2021-07-17 NOTE — Progress Notes (Signed)
Acute Office Visit  Subjective:    Patient ID: Pamela Gibson, female    DOB: 1965/10/27, 55 y.o.   MRN: [id number]  Chief Complaint  Patient presents with   Urinary Frequency    Sx started on 07/16/21, frequency and urgency to use the bathroom, some burning at the end of the urination.    HPI Patient is in today for Urinary symptoms  Symptoms started on 07/16/2021 Hx of UTIs. No hx of kidney stone No otc treatments tried Drinking plenty of fluid. No trouble urinating  Past Medical History:  Diagnosis Date   Anxiety    BACK PAIN 07/23/2007   Barrett's esophagus    CHEST PAIN 09/04/2010   Colon adenoma    Cyst of right ovary 07/03/2015   DEGENERATIVE JOINT DISEASE, LUMBAR SPINE 07/23/2007   Depression    Gastric polyp    x3   GERD 07/22/2007   H/O total hysterectomy    HYPERLIPIDEMIA 07/22/2007   IBS 07/23/2007   MITRAL VALVE PROLAPSE 07/22/2007   Pinched nerve    LEFT SIDE, DISC 3,4,5   Pneumonia    YEARS AGO   SINUSITIS- ACUTE-NOS 09/09/2007   TACHYCARDIA 08/20/2010   Tuberculosis     MOTHER HAD YEARS AGO   Unspecified Vaginitis and Vulvovaginitis 12/17/2007    Past Surgical History:  Procedure Laterality Date   ABDOMINAL HYSTERECTOMY     CHOLECYSTECTOMY     CYSTOSCOPY  11/11/2017   Procedure: CYSTOSCOPY;  Surgeon: Myna Hidalgo, DO;  Location: [facility];  Service: Gynecology;;   LAPAROSCOPIC BILATERAL SALPINGO OOPHERECTOMY Bilateral 11/11/2017   Procedure: LAPAROSCOPIC BILATERAL SALPINGO OOPHORECTOMY;  Surgeon: Myna Hidalgo, DO;  Location: [facility];  Service: Gynecology;  Laterality: Bilateral;   RLE tib/fib fx/surgury     TONSILLECTOMY     TUBAL LIGATION      Family History  Problem Relation Age of Onset   Colon polyps Mother    Angina Mother    Heart disease Mother        CVA   Coronary artery disease Mother    Stroke Mother    Hypertension Other    Diabetes Other    Hyperlipidemia Other    Breast cancer Neg Hx    Esophageal cancer Neg Hx     Colon cancer Neg Hx    Pancreatic cancer Neg Hx    Stomach cancer Neg Hx    Liver disease Neg Hx     Social History   Socioeconomic History   Marital status: Married    Spouse name: Not on file   Number of children: 3   Years of education: Not on file   Highest education level: Not on file  Occupational History   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    Employer: [employer]  Tobacco Use   Smoking status: Never   Smokeless tobacco: Never  Vaping Use   Vaping Use: Never used  Substance and Sexual Activity   Alcohol use: Yes    Comment: occasional   Drug use: No   Sexual activity: Never  Other Topics Concern   Not on file  Social History Narrative   Not on file   Social Determinants of Health   Financial Resource Strain: Not on file  Food Insecurity: Not on file  Transportation Needs: Not on file  Physical Activity: Not on file  Stress: Not on file  Social Connections: Not on file  Intimate Partner Violence: Not on file    Outpatient Medications Prior  to Visit  Medication Sig Dispense Refill   ALPRAZolam (XANAX) 1 MG tablet TAKE 1 TABLET BY MOUTH THREE TIMES A DAY AS NEEDED 90 tablet 2   aspirin 81 MG EC tablet Take 81 mg by mouth every other day.     calcium carbonate (TUMS - DOSED IN MG ELEMENTAL CALCIUM) 500 MG chewable tablet Chew 1-2 tablets by mouth daily as needed for indigestion or heartburn.     Cholecalciferol (VITAMIN D3) 25 MCG (1000 UT) CAPS Take 2 capsules by mouth daily.     [employer] PURELAX 17 GM/SCOOP powder SMARTSIG:17 Gram(s) By Mouth Daily PRN     NARCAN 4 MG/0.1ML LIQD nasal spray kit      oxyCODONE-acetaminophen (PERCOCET) 10-325 MG tablet Take 1 tablet by mouth every 4 (four) hours as needed for pain.     pantoprazole (PROTONIX) 40 MG tablet Take 40 mg by mouth daily.     pravastatin (PRAVACHOL) 40 MG tablet TAKE 1 TABLET BY MOUTH DAILY 90 tablet 3   vitamin B-12 (CYANOCOBALAMIN) 1000 MCG tablet Take 1 tablet (1,000 mcg total) by  mouth daily. 90 tablet 3   No facility-administered medications prior to visit.    Allergies  Allergen Reactions   Atorvastatin     REACTION: more forgetful   Ibuprofen     INSTRUCTED BY MD NOT TO TAKE DUE TO GI UPSET   Morphine Sulfate Er Other (See Comments)   Penicillin G Other (See Comments)   Penicillins     turns blue, childhood allergy Has patient had a PCN reaction causing immediate rash, facial/tongue/throat swelling, SOB or lightheadedness with hypotension: Yes Has patient had a PCN reaction causing severe rash involving mucus membranes or skin necrosis: No Has patient had a PCN reaction that required hospitalization: Unknown  Has patient had a PCN reaction occurring within the last 10 years: No If all of the above answers are "NO", then may proceed with Cephalosporin use.    Simvastatin     REACTION: more forgetful   Tetracycline     REACTION: itch   Tetracycline Hcl Other (See Comments)    Review of Systems  Constitutional:  Negative for chills and fever.  Respiratory:  Negative for shortness of breath.   Cardiovascular:  Negative for chest pain.  Gastrointestinal:  Positive for constipation. Negative for abdominal pain, diarrhea, nausea and vomiting.  Genitourinary:  Positive for dysuria, frequency and urgency. Negative for hematuria.       Incomplete emptying      Objective:    Physical Exam Vitals and nursing note reviewed.  Constitutional:      Appearance: She is obese.  Cardiovascular:     Rate and Rhythm: Normal rate and regular rhythm.  Pulmonary:     Effort: Pulmonary effort is normal.     Breath sounds: Normal breath sounds.  Abdominal:     General: Bowel sounds are normal. There is no distension.     Palpations: There is no mass.     Tenderness: There is abdominal tenderness in the suprapubic area. There is no right CVA tenderness or left CVA tenderness.  Skin:    General: Skin is warm.  Neurological:     Mental Status: She is alert.   Psychiatric:        Mood and Affect: Mood normal.        Behavior: Behavior normal.        Thought Content: Thought content normal.        Judgment: Judgment normal.  There were no vitals taken for this visit. Wt Readings from Last 3 Encounters:  06/14/21 219 lb (99.3 kg)  05/27/21 218 lb 6.4 oz (99.1 kg)  04/24/21 216 lb (98 kg)    Health Maintenance Due  Topic Date Due   Zoster Vaccines- Shingrix (1 of 2) Never done    There are no preventive care reminders to display for this patient.   Lab Results  Component Value Date   TSH 2.04 02/13/2021   Lab Results  Component Value Date   WBC 7.9 02/13/2021   HGB 15.7 (H) 02/13/2021   HCT 46.6 (H) 02/13/2021   MCV 88.0 02/13/2021   PLT 244.0 02/13/2021   Lab Results  Component Value Date   NA 139 02/13/2021   K 4.0 02/13/2021   CO2 29 02/13/2021   GLUCOSE 85 02/13/2021   BUN 15 02/13/2021   CREATININE 0.91 02/13/2021   BILITOT 0.7 02/13/2021   ALKPHOS 54 02/13/2021   AST 19 02/13/2021   ALT 24 02/13/2021   PROT 7.2 02/13/2021   ALBUMIN 4.3 02/13/2021   CALCIUM 9.6 02/13/2021   ANIONGAP 11 10/22/2020   GFR 70.92 02/13/2021   Lab Results  Component Value Date   CHOL 179 02/13/2021   Lab Results  Component Value Date   HDL 50.40 02/13/2021   Lab Results  Component Value Date   LDLCALC 106 (H) 02/13/2021   Lab Results  Component Value Date   TRIG 112.0 02/13/2021   Lab Results  Component Value Date   CHOLHDL 4 02/13/2021   Lab Results  Component Value Date   HGBA1C 5.7 02/13/2021       Assessment & Plan:   Problem List Items Addressed This Visit       Genitourinary   Acute cystitis with hematuria    Patient's UA indicative of acute cystitis with hematuria.  We will send in Macrobid 100 mg twice daily for 7 days.  Discussed signs and symptoms as well as seek urgent or emergent health care over the holiday weekend.  Patient acknowledged.      Relevant Medications   nitrofurantoin,  macrocrystal-monohydrate, (MACROBID) 100 MG capsule     Other   Dysuria - Primary    UA indicative of urinary tract infection start antibiotics follow-up if no improvement.  Urine culture microscopy to guide antibiotic therapy and see how much blood she has been feeling.  States history of hematuria was referred to Dr. Felipa Eth cystoscopy ordered and performed patient states came out clean      Relevant Orders   POCT URINALYSIS DIP (CLINITEK) (Completed)   Urinalysis, microscopic only   Urine Culture     No orders of the defined types were placed in this encounter.  This visit occurred during the SARS-CoV-2 public health emergency.  Safety protocols were in place, including screening questions prior to the visit, additional usage of staff PPE, and extensive cleaning of exam room while observing appropriate contact time as indicated for disinfecting solutions.   Romilda Garret, NP

## 2021-07-17 NOTE — Assessment & Plan Note (Signed)
Patient's UA indicative of acute cystitis with hematuria.  We will send in Macrobid 100 mg twice daily for 7 days.  Discussed signs and symptoms as well as seek urgent or emergent health care over the holiday weekend.  Patient acknowledged.

## 2021-07-17 NOTE — Patient Instructions (Signed)
Nice to see you today Sent medication to your pharmacy, can start taking them today Follow up if symptoms fail to improve or get worse on the antibiotic

## 2021-07-17 NOTE — Assessment & Plan Note (Addendum)
UA indicative of urinary tract infection start antibiotics follow-up if no improvement.  Urine culture microscopy to guide antibiotic therapy and see how much blood she has been feeling.  States history of hematuria was referred to Dr. Felipa Eth cystoscopy ordered and performed patient states came out clean

## 2021-07-19 LAB — URINE CULTURE
MICRO NUMBER:: 12675671
SPECIMEN QUALITY:: ADEQUATE

## 2021-07-23 ENCOUNTER — Ambulatory Visit
Admission: RE | Admit: 2021-07-23 | Discharge: 2021-07-23 | Disposition: A | Discharge status: Home / Self Care | Payer: 59 | Source: Ambulatory Visit | Attending: Internal Medicine | Admitting: Internal Medicine

## 2021-07-23 DIAGNOSIS — Z1231 Encounter for screening mammogram for malignant neoplasm of breast: Secondary | ICD-10-CM

## 2021-08-08 ENCOUNTER — Encounter (INDEPENDENT_AMBULATORY_CARE_PROVIDER_SITE_OTHER): Payer: 59 | Admitting: Ophthalmology

## 2021-08-08 ENCOUNTER — Other Ambulatory Visit: Payer: Self-pay

## 2021-08-08 DIAGNOSIS — H43813 Vitreous degeneration, bilateral: Secondary | ICD-10-CM

## 2021-08-08 DIAGNOSIS — H348112 Central retinal vein occlusion, right eye, stable: Secondary | ICD-10-CM

## 2021-08-15 ENCOUNTER — Ambulatory Visit (INDEPENDENT_AMBULATORY_CARE_PROVIDER_SITE_OTHER): Payer: 59 | Admitting: Internal Medicine

## 2021-08-15 ENCOUNTER — Other Ambulatory Visit: Payer: Self-pay | Admitting: Internal Medicine

## 2021-08-15 ENCOUNTER — Encounter: Payer: Self-pay | Admitting: Internal Medicine

## 2021-08-15 ENCOUNTER — Other Ambulatory Visit: Payer: Self-pay

## 2021-08-15 VITALS — BP 118/78 | HR 65 | Temp 97.9°F | Ht 65.0 in | Wt 219.0 lb

## 2021-08-15 DIAGNOSIS — E559 Vitamin D deficiency, unspecified: Secondary | ICD-10-CM

## 2021-08-15 DIAGNOSIS — F419 Anxiety disorder, unspecified: Secondary | ICD-10-CM

## 2021-08-15 DIAGNOSIS — E785 Hyperlipidemia, unspecified: Secondary | ICD-10-CM

## 2021-08-15 DIAGNOSIS — R739 Hyperglycemia, unspecified: Secondary | ICD-10-CM | POA: Diagnosis not present

## 2021-08-15 DIAGNOSIS — E538 Deficiency of other specified B group vitamins: Secondary | ICD-10-CM

## 2021-08-15 DIAGNOSIS — E7800 Pure hypercholesterolemia, unspecified: Secondary | ICD-10-CM | POA: Diagnosis not present

## 2021-08-15 LAB — BASIC METABOLIC PANEL
BUN: 10 mg/dL (ref 6–23)
CO2: 30 mEq/L (ref 19–32)
Calcium: 9.9 mg/dL (ref 8.4–10.5)
Chloride: 103 mEq/L (ref 96–112)
Creatinine, Ser: 0.89 mg/dL (ref 0.40–1.20)
GFR: 72.58 mL/min (ref >60.00)
Glucose, Bld: 94 mg/dL (ref 70–99)
Potassium: 4.2 mEq/L (ref 3.5–5.1)
Sodium: 140 mEq/L (ref 135–145)

## 2021-08-15 LAB — LIPID PANEL
Cholesterol: 165 mg/dL (ref 0–200)
HDL: 52.2 mg/dL (ref >39.00)
LDL Cholesterol: 91 mg/dL (ref 0–99)
NonHDL: 112.81
Total CHOL/HDL Ratio: 3
Triglycerides: 108 mg/dL (ref 0.0–149.0)
VLDL: 21.6 mg/dL (ref 0.0–40.0)

## 2021-08-15 LAB — HEPATIC FUNCTION PANEL
ALT: 24 U/L (ref 0–35)
AST: 21 U/L (ref 0–37)
Albumin: 4.4 g/dL (ref 3.5–5.2)
Alkaline Phosphatase: 56 U/L (ref 39–117)
Bilirubin, Direct: 0.1 mg/dL (ref 0.0–0.3)
Total Bilirubin: 0.7 mg/dL (ref 0.2–1.2)
Total Protein: 7.4 g/dL (ref 6.0–8.3)

## 2021-08-15 LAB — HEMOGLOBIN A1C: Hgb A1c MFr Bld: 5.7 % (ref 4.6–6.5)

## 2021-08-15 LAB — VITAMIN B12: Vitamin B-12: 1382 pg/mL — ABNORMAL HIGH (ref 211–911)

## 2021-08-15 LAB — VITAMIN D 25 HYDROXY (VIT D DEFICIENCY, FRACTURES): VITD: 37.59 ng/mL (ref 30.00–100.00)

## 2021-08-15 MED ORDER — PRAVASTATIN SODIUM 80 MG PO TABS: 80.0000 mg | ORAL_TABLET | Freq: Every day | ORAL | 3 refills | Status: DC

## 2021-08-15 NOTE — Progress Notes (Signed)
Patient ID: Pamela Gibson, female   DOB: Apr 20, 1966, 55 y.o.   MRN: [id number]        Chief Complaint: follow up HTN, HLD, low vit d, anxiety, hyperglycemia       HPI:  Pamela Gibson is a 55 y.o. female here overall doing well, Pt denies chest pain, increased sob or doe, wheezing, orthopnea, PND, increased LE swelling, palpitations, dizziness or syncope.   Pt denies polydipsia, polyuria, or new focal neuro s/s.   Pt denies fever, wt loss, night sweats, loss of appetite, or other constitutional symptoms  No new complaints  Only taking 1000 u vit d .  Denies worsening depressive symptoms, suicidal ideation, or panic Wt Readings from Last 3 Encounters:  08/15/21 219 lb (99.3 kg)  07/17/21 221 lb 2 oz (100.3 kg)  06/14/21 219 lb (99.3 kg)   BP Readings from Last 3 Encounters:  08/15/21 118/78  07/17/21 116/72  06/14/21 110/70         Past Medical History:  Diagnosis Date   Anxiety    BACK PAIN 07/23/2007   Barrett's esophagus    CHEST PAIN 09/04/2010   Colon adenoma    Cyst of right ovary 07/03/2015   DEGENERATIVE JOINT DISEASE, LUMBAR SPINE 07/23/2007   Depression    Gastric polyp    x3   GERD 07/22/2007   H/O total hysterectomy    HYPERLIPIDEMIA 07/22/2007   IBS 07/23/2007   MITRAL VALVE PROLAPSE 07/22/2007   Pinched nerve    LEFT SIDE, DISC 3,4,5   Pneumonia    YEARS AGO   SINUSITIS- ACUTE-NOS 09/09/2007   TACHYCARDIA 08/20/2010   Tuberculosis     MOTHER HAD YEARS AGO   Unspecified Vaginitis and Vulvovaginitis 12/17/2007   Past Surgical History:  Procedure Laterality Date   ABDOMINAL HYSTERECTOMY     CHOLECYSTECTOMY     CYSTOSCOPY  11/11/2017   Procedure: CYSTOSCOPY;  Surgeon: Janyth Pupa, DO;  Location: [facility];  Service: Gynecology;;   LAPAROSCOPIC BILATERAL SALPINGO OOPHERECTOMY Bilateral 11/11/2017   Procedure: LAPAROSCOPIC BILATERAL SALPINGO OOPHORECTOMY;  Surgeon: Janyth Pupa, DO;  Location: [facility];  Service: Gynecology;  Laterality: Bilateral;   RLE  tib/fib fx/surgury     TONSILLECTOMY     TUBAL LIGATION      reports that she has never smoked. She has never used smokeless tobacco. She reports current alcohol use. She reports that she does not use drugs. family history includes Angina in her mother; Colon polyps in her mother; Coronary artery disease in her mother; Diabetes in an other family member; Heart disease in her mother; Hyperlipidemia in an other family member; Hypertension in an other family member; Stroke in her mother. Allergies  Allergen Reactions   Atorvastatin     REACTION: more forgetful   Ibuprofen     INSTRUCTED BY MD NOT TO TAKE DUE TO GI UPSET   Morphine Sulfate Er Other (See Comments)   Penicillin G Other (See Comments)   Penicillins     turns blue, childhood allergy Has patient had a PCN reaction causing immediate rash, facial/tongue/throat swelling, SOB or lightheadedness with hypotension: Yes Has patient had a PCN reaction causing severe rash involving mucus membranes or skin necrosis: No Has patient had a PCN reaction that required hospitalization: Unknown  Has patient had a PCN reaction occurring within the last 10 years: No If all of the above answers are "NO", then may proceed with Cephalosporin use.    Simvastatin     REACTION: more forgetful  Tetracycline     REACTION: itch   Tetracycline Hcl Other (See Comments)   Current Outpatient Medications on File Prior to Visit  Medication Sig Dispense Refill   ALPRAZolam (XANAX) 1 MG tablet TAKE 1 TABLET BY MOUTH THREE TIMES A DAY AS NEEDED 90 tablet 2   aspirin 81 MG EC tablet Take 81 mg by mouth every other day.     calcium carbonate (TUMS - DOSED IN MG ELEMENTAL CALCIUM) 500 MG chewable tablet Chew 1-2 tablets by mouth daily as needed for indigestion or heartburn.     Cholecalciferol (VITAMIN D3) 25 MCG (1000 UT) CAPS Take 2 capsules by mouth daily.     [facility] PURELAX 17 GM/SCOOP powder SMARTSIG:17 Gram(s) By Mouth Daily PRN     NARCAN 4 MG/0.1ML LIQD  nasal spray kit      oxyCODONE-acetaminophen (PERCOCET) 10-325 MG tablet Take 1 tablet by mouth every 4 (four) hours as needed for pain.     pantoprazole (PROTONIX) 40 MG tablet Take 40 mg by mouth daily.     vitamin B-12 (CYANOCOBALAMIN) 1000 MCG tablet Take 1 tablet (1,000 mcg total) by mouth daily. 90 tablet 3   tiZANidine (ZANAFLEX) 2 MG tablet Take 2 mg by mouth 3 (three) times daily as needed. (Patient not taking: Reported on 07/17/2021)     No current facility-administered medications on file prior to visit.        ROS:  All others reviewed and negative.  Objective        PE:  BP 118/78 (BP Location: Left Arm, Patient Position: Sitting, Cuff Size: Large)    Pulse 65    Temp 97.9 F (36.6 C) (Oral)    Ht _0  (1.651 m)    Wt 219 lb (99.3 kg)    SpO2 99%    BMI 36.44 kg/m                 Constitutional: Pt appears in NAD               HENT: Head: NCAT.                Right Ear: External ear normal.                 Left Ear: External ear normal.                Eyes: . Pupils are equal, round, and reactive to light. Conjunctivae and EOM are normal               Nose: without d/c or deformity               Neck: Neck supple. Gross normal ROM               Cardiovascular: Normal rate and regular rhythm.                 Pulmonary/Chest: Effort normal and breath sounds without rales or wheezing.                Abd:  Soft, NT, ND, + BS, no organomegaly               Neurological: Pt is alert. At baseline orientation, motor grossly intact               Skin: Skin is warm. No rashes, no other new lesions, LE edema - none               Psychiatric: Pt  behavior is normal without agitation   Micro: none  Cardiac tracings I have personally interpreted today:  none  Pertinent Radiological findings (summarize): none   Lab Results  Component Value Date   WBC 7.9 02/13/2021   HGB 15.7 (H) 02/13/2021   HCT 46.6 (H) 02/13/2021   PLT 244.0 02/13/2021   GLUCOSE 94 08/15/2021   CHOL 165  08/15/2021   TRIG 108.0 08/15/2021   HDL 52.20 08/15/2021   LDLDIRECT 128.8 07/18/2008   LDLCALC 91 08/15/2021   ALT 24 08/15/2021   AST 21 08/15/2021   NA 140 08/15/2021   K 4.2 08/15/2021   CL 103 08/15/2021   CREATININE 0.89 08/15/2021   BUN 10 08/15/2021   CO2 30 08/15/2021   TSH 2.04 02/13/2021   HGBA1C 5.7 08/15/2021   MICROALBUR <0.7 08/10/2019   Assessment/Plan:  PACEY WILLADSEN is a 55 y.o. White or Caucasian [1] female with  has a past medical history of Anxiety, BACK PAIN (07/23/2007), Barrett's esophagus, CHEST PAIN (09/04/2010), Colon adenoma, Cyst of right ovary (07/03/2015), DEGENERATIVE JOINT DISEASE, LUMBAR SPINE (07/23/2007), Depression, Gastric polyp, GERD (07/22/2007), H/O total hysterectomy, HYPERLIPIDEMIA (07/22/2007), IBS (07/23/2007), MITRAL VALVE PROLAPSE (07/22/2007), Pinched nerve, Pneumonia, SINUSITIS- ACUTE-NOS (09/09/2007), TACHYCARDIA (08/20/2010), Tuberculosis, and Unspecified Vaginitis and Vulvovaginitis (12/17/2007).  Hyperlipidemia Lab Results  Component Value Date   LDLCALC 91 08/15/2021   Uncontrolled, goal ldl < 70, pt to increase pravachol 80   Vitamin B12 deficiency Lab Results  Component Value Date   VITAMINB12 1,382 (H) 08/15/2021   Stable, cont oral replacement - b12 1000 mcg qd   Vitamin D deficiency Last vitamin D Lab Results  Component Value Date   VD25OH 37.59 08/15/2021   Low normal, to increase vit d 2000u qd, oral replacement   Anxiety Overall stable, cont current med tx,  to f/u any worsening symptoms or concerns  Hyperglycemia Lab Results  Component Value Date   HGBA1C 5.7 08/15/2021   Stable, pt to continue current medical treatment  - diet, wt control  Followup: Return in about 6 months (around 02/13/2022).  Cathlean Cower, MD 08/19/2021 8:02 AM [facility]

## 2021-08-15 NOTE — Patient Instructions (Signed)
Ok to increase the vitamin D to 2000 units per day  Please continue all other medications as before, and refills have been done if requested.  Please have the pharmacy call with any other refills you may need.  Please continue your efforts at being more active, low cholesterol diet, and weight control.  Please keep your appointments with your specialists as you may have planned  Please go to the LAB at the blood drawing area for the tests to be done  You will be contacted by phone if any changes need to be made immediately.  Otherwise, you will receive a letter about your results with an explanation, but please check with MyChart first.  Please remember to sign up for MyChart if you have not done so, as this will be important to you in the future with finding out test results, communicating by private email, and scheduling acute appointments online when needed.  Please make an Appointment to return in 6 months, or sooner if needed, also with Lab Appointment for testing done 3-5 days before at the [place] (so this is for TWO appointments - please see the scheduling desk as you leave)  Due to the ongoing Covid 19 pandemic, our lab now requires an appointment for any labs done at our office.  If you need labs done and do not have an appointment, please call our office ahead of time to schedule before presenting to the lab for your testing.

## 2021-08-19 ENCOUNTER — Encounter: Payer: Self-pay | Admitting: Internal Medicine

## 2021-08-19 NOTE — Assessment & Plan Note (Signed)
Lab Results  Component Value Date   VITAMINB12 7,026 (H) 08/15/2021   Stable, cont oral replacement - b12 1000 mcg qd

## 2021-08-19 NOTE — Assessment & Plan Note (Signed)
Lab Results  Component Value Date   HGBA1C 5.7 08/15/2021   Stable, pt to continue current medical treatment  - diet, wt control

## 2021-08-19 NOTE — Assessment & Plan Note (Signed)
Lab Results  Component Value Date   LDLCALC 91 08/15/2021   Uncontrolled, goal ldl < 70, pt to increase pravachol 80

## 2021-08-19 NOTE — Assessment & Plan Note (Signed)
Last vitamin D Lab Results  Component Value Date   VD25OH 37.59 08/15/2021   Low normal, to increase vit d 2000u qd, oral replacement

## 2021-08-19 NOTE — Assessment & Plan Note (Signed)
Overall stable, cont current med tx, . to f/u any worsening symptoms or concerns 

## 2021-08-28 ENCOUNTER — Telehealth: Payer: Self-pay | Admitting: Internal Medicine

## 2021-08-28 NOTE — Telephone Encounter (Signed)
Patient calling in  Requesting new rx for ALPRAZolam Pamela Gibson) 1 MG tablet be sent to [facility] since her insurance has changed  [place], [address]  Phone:  [phone] Fax:  [phone]

## 2021-08-28 NOTE — Telephone Encounter (Signed)
Done earlier today

## 2021-08-29 NOTE — Telephone Encounter (Signed)
Patient calling in  Rx for ALPRAZolam Pamela Gibson) 1 MG tablet needs to be sent [facility] of [facility]  [place], [address]  Phone:             [phone] Fax:     [phone]

## 2021-08-30 MED ORDER — ALPRAZOLAM 1 MG PO TABS: 1.0000 mg | ORAL_TABLET | Freq: Three times a day (TID) | ORAL | 2 refills | Status: DC | PRN

## 2021-08-30 NOTE — Telephone Encounter (Signed)
Ok done

## 2021-08-30 NOTE — Addendum Note (Signed)
Addended by: Biagio Borg on: 08/30/2021 01:20 PM   Modules accepted: Orders

## 2021-09-04 ENCOUNTER — Encounter: Payer: Self-pay | Admitting: Internal Medicine

## 2021-09-04 ENCOUNTER — Ambulatory Visit (INDEPENDENT_AMBULATORY_CARE_PROVIDER_SITE_OTHER): Payer: Managed Care, Other (non HMO) | Admitting: Internal Medicine

## 2021-09-04 ENCOUNTER — Other Ambulatory Visit: Payer: Self-pay

## 2021-09-04 VITALS — BP 126/72 | HR 78 | Ht 65.0 in | Wt 219.0 lb

## 2021-09-04 DIAGNOSIS — G4700 Insomnia, unspecified: Secondary | ICD-10-CM | POA: Diagnosis not present

## 2021-09-04 DIAGNOSIS — E7800 Pure hypercholesterolemia, unspecified: Secondary | ICD-10-CM | POA: Diagnosis not present

## 2021-09-04 DIAGNOSIS — E559 Vitamin D deficiency, unspecified: Secondary | ICD-10-CM

## 2021-09-04 DIAGNOSIS — I10 Essential (primary) hypertension: Secondary | ICD-10-CM

## 2021-09-04 DIAGNOSIS — R519 Headache, unspecified: Secondary | ICD-10-CM | POA: Diagnosis not present

## 2021-09-04 DIAGNOSIS — F419 Anxiety disorder, unspecified: Secondary | ICD-10-CM

## 2021-09-04 DIAGNOSIS — E538 Deficiency of other specified B group vitamins: Secondary | ICD-10-CM

## 2021-09-04 DIAGNOSIS — I700 Atherosclerosis of aorta: Secondary | ICD-10-CM

## 2021-09-04 DIAGNOSIS — Z0001 Encounter for general adult medical examination with abnormal findings: Secondary | ICD-10-CM

## 2021-09-04 MED ORDER — GABAPENTIN 100 MG PO CAPS: 100.0000 mg | ORAL_CAPSULE | Freq: Three times a day (TID) | ORAL | 1 refills | Status: DC

## 2021-09-04 MED ORDER — LOSARTAN POTASSIUM 25 MG PO TABS: 25.0000 mg | ORAL_TABLET | Freq: Every day | ORAL | 3 refills | Status: DC

## 2021-09-04 MED ORDER — TRAZODONE HCL 50 MG PO TABS: 25.0000 mg | ORAL_TABLET | Freq: Every evening | ORAL | 1 refills | Status: DC | PRN

## 2021-09-04 MED ORDER — ALPRAZOLAM 1 MG PO TABS: 1.0000 mg | ORAL_TABLET | Freq: Three times a day (TID) | ORAL | 2 refills | Status: DC | PRN

## 2021-09-04 NOTE — Progress Notes (Deleted)
Patient ID: Pamela Gibson, female   DOB: 1966-04-27, 56 y.o.   MRN: [id number]       Chief Complaint: follow up HTN, HLD and hyperglycemia ***       HPI:  Pamela Gibson is a 56 y.o. female here with c/o           Also [employer] not working [facility], so needs xanax sent to [facility].    Wt Readings from Last 3 Encounters:  09/04/21 219 lb (99.3 kg)  08/15/21 219 lb (99.3 kg)  07/17/21 221 lb 2 oz (100.3 kg)   BP Readings from Last 3 Encounters:  09/04/21 126/72  08/15/21 118/78  07/17/21 116/72         Past Medical History:  Diagnosis Date   Anxiety    BACK PAIN 07/23/2007   Barrett's esophagus    CHEST PAIN 09/04/2010   Colon adenoma    Cyst of right ovary 07/03/2015   DEGENERATIVE JOINT DISEASE, LUMBAR SPINE 07/23/2007   Depression    Gastric polyp    x3   GERD 07/22/2007   H/O total hysterectomy    HYPERLIPIDEMIA 07/22/2007   IBS 07/23/2007   MITRAL VALVE PROLAPSE 07/22/2007   Pinched nerve    LEFT SIDE, DISC 3,4,5   Pneumonia    YEARS AGO   SINUSITIS- ACUTE-NOS 09/09/2007   TACHYCARDIA 08/20/2010   Tuberculosis     MOTHER HAD YEARS AGO   Unspecified Vaginitis and Vulvovaginitis 12/17/2007   Past Surgical History:  Procedure Laterality Date   ABDOMINAL HYSTERECTOMY     CHOLECYSTECTOMY     CYSTOSCOPY  11/11/2017   Procedure: CYSTOSCOPY;  Surgeon: Janyth Pupa, DO;  Location: [facility];  Service: Gynecology;;   LAPAROSCOPIC BILATERAL SALPINGO OOPHERECTOMY Bilateral 11/11/2017   Procedure: LAPAROSCOPIC BILATERAL SALPINGO OOPHORECTOMY;  Surgeon: Janyth Pupa, DO;  Location: [facility];  Service: Gynecology;  Laterality: Bilateral;   RLE tib/fib fx/surgury     TONSILLECTOMY     TUBAL LIGATION      reports that she has never smoked. She has never used smokeless tobacco. She reports current alcohol use. She reports that she does not use drugs. family history includes Angina in her mother; Colon polyps in her mother; Coronary artery disease in her mother;  Diabetes in an other family member; Heart disease in her mother; Hyperlipidemia in an other family member; Hypertension in an other family member; Stroke in her mother. Allergies  Allergen Reactions   Atorvastatin     REACTION: more forgetful   Ibuprofen     INSTRUCTED BY MD NOT TO TAKE DUE TO GI UPSET   Morphine Sulfate Er Other (See Comments)   Penicillin G Other (See Comments)   Penicillins     turns blue, childhood allergy Has patient had a PCN reaction causing immediate rash, facial/tongue/throat swelling, SOB or lightheadedness with hypotension: Yes Has patient had a PCN reaction causing severe rash involving mucus membranes or skin necrosis: No Has patient had a PCN reaction that required hospitalization: Unknown  Has patient had a PCN reaction occurring within the last 10 years: No If all of the above answers are "NO", then may proceed with Cephalosporin use.    Simvastatin     REACTION: more forgetful   Tetracycline     REACTION: itch   Tetracycline Hcl Other (See Comments)   Current Outpatient Medications on File Prior to Visit  Medication Sig Dispense Refill   aspirin 81 MG EC tablet Take 81 mg by mouth every  other day.     calcium carbonate (TUMS - DOSED IN MG ELEMENTAL CALCIUM) 500 MG chewable tablet Chew 1-2 tablets by mouth daily as needed for indigestion or heartburn.     Cholecalciferol (VITAMIN D3) 25 MCG (1000 UT) CAPS Take 2 capsules by mouth daily.     [facility] PURELAX 17 GM/SCOOP powder SMARTSIG:17 Gram(s) By Mouth Daily PRN     NARCAN 4 MG/0.1ML LIQD nasal spray kit      oxyCODONE-acetaminophen (PERCOCET) 10-325 MG tablet Take 1 tablet by mouth every 4 (four) hours as needed for pain.     pantoprazole (PROTONIX) 40 MG tablet Take 40 mg by mouth daily.     pravastatin (PRAVACHOL) 80 MG tablet Take 1 tablet (80 mg total) by mouth daily. 90 tablet 3   vitamin B-12 (CYANOCOBALAMIN) 1000 MCG tablet Take 1 tablet (1,000 mcg total) by mouth daily. 90 tablet 3   No  current facility-administered medications on file prior to visit.        ROS:  All others reviewed and negative.  Objective        PE:  BP 126/72 (BP Location: Left Arm, Patient Position: Sitting, Cuff Size: Large)    Pulse 78    Ht $R'5\' 5"'dY$  (1.651 m)    Wt 219 lb (99.3 kg)    SpO2 98%    BMI 36.44 kg/m                 Constitutional: Pt appears in NAD               HENT: Head: NCAT.                Right Ear: External ear normal.                 Left Ear: External ear normal.                Eyes: . Pupils are equal, round, and reactive to light. Conjunctivae and EOM are normal               Nose: without d/c or deformity               Neck: Neck supple. Gross normal ROM               Cardiovascular: Normal rate and regular rhythm.                 Pulmonary/Chest: Effort normal and breath sounds without rales or wheezing.                Abd:  Soft, NT, ND, + BS, no organomegaly               Neurological: Pt is alert. At baseline orientation, motor grossly intact               Skin: Skin is warm. No rashes, no other new lesions, LE edema - ***               Psychiatric: Pt behavior is normal without agitation   Micro: none  Cardiac tracings I have personally interpreted today:  none  Pertinent Radiological findings (summarize): none   Lab Results  Component Value Date   WBC 7.9 02/13/2021   HGB 15.7 (H) 02/13/2021   HCT 46.6 (H) 02/13/2021   PLT 244.0 02/13/2021   GLUCOSE 94 08/15/2021   CHOL 165 08/15/2021   TRIG 108.0 08/15/2021   HDL 52.20 08/15/2021   LDLDIRECT 128.8  07/18/2008   LDLCALC 91 08/15/2021   ALT 24 08/15/2021   AST 21 08/15/2021   NA 140 08/15/2021   K 4.2 08/15/2021   CL 103 08/15/2021   CREATININE 0.89 08/15/2021   BUN 10 08/15/2021   CO2 30 08/15/2021   TSH 2.04 02/13/2021   HGBA1C 5.7 08/15/2021   MICROALBUR <0.7 08/10/2019   Assessment/Plan:  Pamela Gibson is a 56 y.o. White or Caucasian [1] female with  has a past medical history of Anxiety,  BACK PAIN (07/23/2007), Barrett's esophagus, CHEST PAIN (09/04/2010), Colon adenoma, Cyst of right ovary (07/03/2015), DEGENERATIVE JOINT DISEASE, LUMBAR SPINE (07/23/2007), Depression, Gastric polyp, GERD (07/22/2007), H/O total hysterectomy, HYPERLIPIDEMIA (07/22/2007), IBS (07/23/2007), MITRAL VALVE PROLAPSE (07/22/2007), Pinched nerve, Pneumonia, SINUSITIS- ACUTE-NOS (09/09/2007), TACHYCARDIA (08/20/2010), Tuberculosis, and Unspecified Vaginitis and Vulvovaginitis (12/17/2007).  No problem-specific Assessment & Plan notes found for this encounter.  Followup: No follow-ups on file.  Cathlean Cower, MD 09/04/2021 9:39 AM [facility]

## 2021-09-04 NOTE — Patient Instructions (Addendum)
Please take all new medication as prescribed - the losartan 25 mg per day for BP, the trazodone 50 mg at bedtime for sleep, and the gabapentin 100 mg three time per day for nerve pain  Please continue all other medications as before, and refills have been done if requested - the xanax  Please have the pharmacy call with any other refills you may need.  Please continue your efforts at being more active, low cholesterol diet, and weight control.  Please keep your appointments with your specialists as you may have planned  Please make an Appointment to return in June 28, or sooner if needed, also with Lab Appointment for testing done 3-5 days before at the [facility] (so this is for TWO appointments - please see the scheduling desk as you leave)  Due to the ongoing Covid 19 pandemic, our lab now requires an appointment for any labs done at our office.  If you need labs done and do not have an appointment, please call our office ahead of time to schedule before presenting to the lab for your testing.

## 2021-09-07 ENCOUNTER — Encounter: Payer: Self-pay | Admitting: Internal Medicine

## 2021-09-07 DIAGNOSIS — R519 Headache, unspecified: Secondary | ICD-10-CM | POA: Insufficient documentation

## 2021-09-07 DIAGNOSIS — I10 Essential (primary) hypertension: Secondary | ICD-10-CM | POA: Insufficient documentation

## 2021-09-07 NOTE — Assessment & Plan Note (Signed)
Pt to continue pravachol, low chol diet, excercise

## 2021-09-07 NOTE — Assessment & Plan Note (Signed)
Also for traozodone qhs prn,  to f/u any worsening symptoms or concerns

## 2021-09-07 NOTE — Assessment & Plan Note (Signed)
BP Readings from Last 3 Encounters:  09/04/21 126/72  08/15/21 118/78  07/17/21 116/72   Mild uncontrolled by hx, pt to start losartan 25

## 2021-09-07 NOTE — Assessment & Plan Note (Signed)
?   Neuritic , for gabapentin 100 tid trial

## 2021-09-07 NOTE — Assessment & Plan Note (Signed)
Last vitamin D Lab Results  Component Value Date   VD25OH 37.59 08/15/2021   Low, to start oral replacement

## 2021-09-07 NOTE — Assessment & Plan Note (Signed)
Lab Results  Component Value Date   LDLCALC 91 08/15/2021   Uncontrolled, goal ldl < 70, pt to continue current statin pravachol 80 as declines change, also for lower chol diet

## 2021-09-07 NOTE — Assessment & Plan Note (Signed)
Age and sex appropriate education and counseling updated with regular exercise and diet Referrals for preventative services - declines colonoscopy Immunizations addressed - none needed Smoking counseling  - none needed Evidence for depression or other mood disorder - chronic anxiety stable no change Most recent labs reviewed. I have personally reviewed and have noted: 1) the patient's medical and social history 2) The patient's current medications and supplements 3) The patient's height, weight, and BMI have been recorded in the chart

## 2021-09-07 NOTE — Progress Notes (Signed)
Patient ID: Pamela Gibson, female   DOB: 10/22/1965, 55 y.o.   MRN: [id number]         Chief Complaint:: wellness exam and Headache  , elevated BP, sleep difficulty, anxiety       HPI:  Pamela Gibson is a 56 y.o. female here for wellness exam; declines colonoscopy, o/w up to date                        Also c/o recurring left sided mild to mod HA that seems to start post left neck and head then tingling to the left side of the head without rash, swelling, Pt denies chest pain, increased sob or doe, wheezing, orthopnea, PND, increased LE swelling, palpitations, dizziness or syncope.   Pt denies polydipsia, polyuria, or new focal neuro s/s.   BP has been mild elevated recently in the 140s.  Denies worsening depressive symptoms, suicidal ideation, or panic; has ongoing anxiety, and worsening sleep difficulty, cant get to sleep most nights, . Pt denies fever, wt loss, night sweats, loss of appetite, or other constitutional symptoms  Needs xanax refill.     Wt Readings from Last 3 Encounters:  09/04/21 219 lb (99.3 kg)  08/15/21 219 lb (99.3 kg)  07/17/21 221 lb 2 oz (100.3 kg)   BP Readings from Last 3 Encounters:  09/04/21 126/72  08/15/21 118/78  07/17/21 116/72   Immunization History  Administered Date(s) Administered   Influenza Inj Mdck Quad Pf 04/18/2018   Influenza Whole 07/23/2007, 08/02/2009   Influenza,inj,Quad PF,6+ Mos 04/17/2017, 04/20/2019, 05/05/2020, 05/05/2021   Influenza-Unspecified 05/31/2015, 05/28/2016, 05/17/2017, 04/20/2019, 05/05/2020   PFIZER(Purple Top)SARS-COV-2 Vaccination 12/03/2019, 12/26/2019, 08/10/2020   PPD Test 03/21/2013, 03/21/2014   Pfizer Covid-19 Vaccine Bivalent Booster 23yrs & up 06/03/2021   Td 08/26/2003   Tdap 12/02/2013   Zoster Recombinat (Shingrix) 02/13/2021, 07/09/2021   There are no preventive care reminders to display for this patient.     Past Medical History:  Diagnosis Date   Anxiety    BACK PAIN 07/23/2007   Barrett's  esophagus    CHEST PAIN 09/04/2010   Colon adenoma    Cyst of right ovary 07/03/2015   DEGENERATIVE JOINT DISEASE, LUMBAR SPINE 07/23/2007   Depression    Gastric polyp    x3   GERD 07/22/2007   H/O total hysterectomy    HYPERLIPIDEMIA 07/22/2007   IBS 07/23/2007   MITRAL VALVE PROLAPSE 07/22/2007   Pinched nerve    LEFT SIDE, DISC 3,4,5   Pneumonia    YEARS AGO   SINUSITIS- ACUTE-NOS 09/09/2007   TACHYCARDIA 08/20/2010   Tuberculosis     MOTHER HAD YEARS AGO   Unspecified Vaginitis and Vulvovaginitis 12/17/2007   Past Surgical History:  Procedure Laterality Date   ABDOMINAL HYSTERECTOMY     CHOLECYSTECTOMY     CYSTOSCOPY  11/11/2017   Procedure: CYSTOSCOPY;  Surgeon: Janyth Pupa, DO;  Location: [facility];  Service: Gynecology;;   LAPAROSCOPIC BILATERAL SALPINGO OOPHERECTOMY Bilateral 11/11/2017   Procedure: LAPAROSCOPIC BILATERAL SALPINGO OOPHORECTOMY;  Surgeon: Janyth Pupa, DO;  Location: [facility];  Service: Gynecology;  Laterality: Bilateral;   RLE tib/fib fx/surgury     TONSILLECTOMY     TUBAL LIGATION      reports that she has never smoked. She has never used smokeless tobacco. She reports current alcohol use. She reports that she does not use drugs. family history includes Angina in her mother; Colon polyps in her mother; Coronary artery disease  in her mother; Diabetes in an other family member; Heart disease in her mother; Hyperlipidemia in an other family member; Hypertension in an other family member; Stroke in her mother. Allergies  Allergen Reactions   Atorvastatin     REACTION: more forgetful   Ibuprofen     INSTRUCTED BY MD NOT TO TAKE DUE TO GI UPSET   Morphine Sulfate Er Other (See Comments)   Penicillin G Other (See Comments)   Penicillins     turns blue, childhood allergy Has patient had a PCN reaction causing immediate rash, facial/tongue/throat swelling, SOB or lightheadedness with hypotension: Yes Has patient had a PCN reaction causing severe rash  involving mucus membranes or skin necrosis: No Has patient had a PCN reaction that required hospitalization: Unknown  Has patient had a PCN reaction occurring within the last 10 years: No If all of the above answers are "NO", then may proceed with Cephalosporin use.    Simvastatin     REACTION: more forgetful   Tetracycline     REACTION: itch   Tetracycline Hcl Other (See Comments)   Current Outpatient Medications on File Prior to Visit  Medication Sig Dispense Refill   aspirin 81 MG EC tablet Take 81 mg by mouth every other day.     calcium carbonate (TUMS - DOSED IN MG ELEMENTAL CALCIUM) 500 MG chewable tablet Chew 1-2 tablets by mouth daily as needed for indigestion or heartburn.     Cholecalciferol (VITAMIN D3) 25 MCG (1000 UT) CAPS Take 2 capsules by mouth daily.     [employer] PURELAX 17 GM/SCOOP powder SMARTSIG:17 Gram(s) By Mouth Daily PRN     NARCAN 4 MG/0.1ML LIQD nasal spray kit      oxyCODONE-acetaminophen (PERCOCET) 10-325 MG tablet Take 1 tablet by mouth every 4 (four) hours as needed for pain.     pantoprazole (PROTONIX) 40 MG tablet Take 40 mg by mouth daily.     pravastatin (PRAVACHOL) 80 MG tablet Take 1 tablet (80 mg total) by mouth daily. 90 tablet 3   vitamin B-12 (CYANOCOBALAMIN) 1000 MCG tablet Take 1 tablet (1,000 mcg total) by mouth daily. 90 tablet 3   No current facility-administered medications on file prior to visit.        ROS:  All others reviewed and negative.  Objective        PE:  BP 126/72 (BP Location: Left Arm, Patient Position: Sitting, Cuff Size: Large)    Pulse 78    Ht $R'5\' 5"'uC$  (1.651 m)    Wt 219 lb (99.3 kg)    SpO2 98%    BMI 36.44 kg/m                 Constitutional: Pt appears in NAD               HENT: Head: NCAT.                Right Ear: External ear normal.                 Left Ear: External ear normal.                Eyes: . Pupils are equal, round, and reactive to light. Conjunctivae and EOM are normal               Nose: without d/c or  deformity               Neck: Neck supple. Gross normal ROM  Cardiovascular: Normal rate and regular rhythm.                 Pulmonary/Chest: Effort normal and breath sounds without rales or wheezing.                Abd:  Soft, NT, ND, + BS, no organomegaly               Neurological: Pt is alert. At baseline orientation, motor grossly intact               Skin: Skin is warm. No rashes, no other new lesions, LE edema - none               Psychiatric: Pt behavior is normal without agitation   Micro: none  Cardiac tracings I have personally interpreted today:  none  Pertinent Radiological findings (summarize): none   Lab Results  Component Value Date   WBC 7.9 02/13/2021   HGB 15.7 (H) 02/13/2021   HCT 46.6 (H) 02/13/2021   PLT 244.0 02/13/2021   GLUCOSE 94 08/15/2021   CHOL 165 08/15/2021   TRIG 108.0 08/15/2021   HDL 52.20 08/15/2021   LDLDIRECT 128.8 07/18/2008   LDLCALC 91 08/15/2021   ALT 24 08/15/2021   AST 21 08/15/2021   NA 140 08/15/2021   K 4.2 08/15/2021   CL 103 08/15/2021   CREATININE 0.89 08/15/2021   BUN 10 08/15/2021   CO2 30 08/15/2021   TSH 2.04 02/13/2021   HGBA1C 5.7 08/15/2021   MICROALBUR <0.7 08/10/2019   Assessment/Plan:  Pamela Gibson is a 56 y.o. White or Caucasian [1] female with  has a past medical history of Anxiety, BACK PAIN (07/23/2007), Barrett's esophagus, CHEST PAIN (09/04/2010), Colon adenoma, Cyst of right ovary (07/03/2015), DEGENERATIVE JOINT DISEASE, LUMBAR SPINE (07/23/2007), Depression, Gastric polyp, GERD (07/22/2007), H/O total hysterectomy, HYPERLIPIDEMIA (07/22/2007), IBS (07/23/2007), MITRAL VALVE PROLAPSE (07/22/2007), Pinched nerve, Pneumonia, SINUSITIS- ACUTE-NOS (09/09/2007), TACHYCARDIA (08/20/2010), Tuberculosis, and Unspecified Vaginitis and Vulvovaginitis (12/17/2007).  Encounter for well adult exam with abnormal findings Age and sex appropriate education and counseling updated with regular exercise and  diet Referrals for preventative services - declines colonoscopy Immunizations addressed - none needed Smoking counseling  - none needed Evidence for depression or other mood disorder - chronic anxiety stable no change Most recent labs reviewed. I have personally reviewed and have noted: 1) the patient's medical and social history 2) The patient's current medications and supplements 3) The patient's height, weight, and BMI have been recorded in the chart   Anxiety Chronic persistent, for xanax refill prn,  to f/u any worsening symptoms or concerns  Aortic atherosclerosis (HCC) Pt to continue pravachol, low chol diet, excercise  Vitamin D deficiency Last vitamin D Lab Results  Component Value Date   VD25OH 37.59 08/15/2021   Low, to start oral replacement   Vitamin B12 deficiency Lab Results  Component Value Date   VITAMINB12 1,382 (H) 08/15/2021   Stable, cont oral replacement - b12 1000 mcg qd   Hyperlipidemia Lab Results  Component Value Date   LDLCALC 91 08/15/2021   Uncontrolled, goal ldl < 70, pt to continue current statin pravachol 80 as declines change, also for lower chol diet   Headache ? Neuritic , for gabapentin 100 tid trial  HTN (hypertension) BP Readings from Last 3 Encounters:  09/04/21 126/72  08/15/21 118/78  07/17/21 116/72   Mild uncontrolled by hx, pt to start losartan 25   Insomnia Also for traozodone qhs prn,  to f/u any worsening symptoms or concerns  Followup: Return in about 24 weeks (around 02/19/2022).  Cathlean Cower, MD 09/07/2021 9:02 PM [facility]

## 2021-09-07 NOTE — Assessment & Plan Note (Signed)
Chronic persistent, for xanax refill prn,  to f/u any worsening symptoms or concerns

## 2021-09-07 NOTE — Assessment & Plan Note (Signed)
Lab Results  Component Value Date   VITAMINB12 8,185 (H) 08/15/2021   Stable, cont oral replacement - b12 1000 mcg qd

## 2021-09-13 ENCOUNTER — Encounter: Payer: Self-pay | Admitting: Nurse Practitioner

## 2021-09-13 ENCOUNTER — Ambulatory Visit (INDEPENDENT_AMBULATORY_CARE_PROVIDER_SITE_OTHER): Payer: Managed Care, Other (non HMO) | Admitting: Nurse Practitioner

## 2021-09-13 ENCOUNTER — Other Ambulatory Visit: Payer: Self-pay

## 2021-09-13 VITALS — BP 130/82 | HR 83 | Temp 98.0°F | Ht 65.0 in | Wt 221.0 lb

## 2021-09-13 DIAGNOSIS — J069 Acute upper respiratory infection, unspecified: Secondary | ICD-10-CM

## 2021-09-13 DIAGNOSIS — J029 Acute pharyngitis, unspecified: Secondary | ICD-10-CM | POA: Diagnosis not present

## 2021-09-13 LAB — POCT RAPID STREP A (OFFICE): Rapid Strep A Screen: NEGATIVE

## 2021-09-13 NOTE — Progress Notes (Signed)
Subjective:  Patient ID: Pamela Gibson, female    DOB: 30-Apr-1966  Age: 56 y.o. MRN: [id number]  CC:  Chief Complaint  Patient presents with   Sore Throat      HPI  This patient arrives today for the above.  Symptoms started 5 to 6 days ago.  She is experiencing sore throat, ear pain, nasal congestion, and intermittent cough.  She denies chest pain or shortness of breath.  She took a COVID test today this was negative.  Past Medical History:  Diagnosis Date   Anxiety    BACK PAIN 07/23/2007   Barrett's esophagus    CHEST PAIN 09/04/2010   Colon adenoma    Cyst of right ovary 07/03/2015   DEGENERATIVE JOINT DISEASE, LUMBAR SPINE 07/23/2007   Depression    Gastric polyp    x3   GERD 07/22/2007   H/O total hysterectomy    HYPERLIPIDEMIA 07/22/2007   IBS 07/23/2007   MITRAL VALVE PROLAPSE 07/22/2007   Pinched nerve    LEFT SIDE, DISC 3,4,5   Pneumonia    YEARS AGO   SINUSITIS- ACUTE-NOS 09/09/2007   TACHYCARDIA 08/20/2010   Tuberculosis     MOTHER HAD YEARS AGO   Unspecified Vaginitis and Vulvovaginitis 12/17/2007      Family History  Problem Relation Age of Onset   Colon polyps Mother    Angina Mother    Heart disease Mother        CVA   Coronary artery disease Mother    Stroke Mother    Hypertension Other    Diabetes Other    Hyperlipidemia Other    Breast cancer Neg Hx    Esophageal cancer Neg Hx    Colon cancer Neg Hx    Pancreatic cancer Neg Hx    Stomach cancer Neg Hx    Liver disease Neg Hx     Social History   Social History Narrative   Not on file   Social History   Tobacco Use   Smoking status: Never   Smokeless tobacco: Never  Substance Use Topics   Alcohol use: Yes    Comment: occasional     Current Meds  Medication Sig   ALPRAZolam (XANAX) 1 MG tablet Take 1 tablet (1 mg total) by mouth 3 (three) times daily as needed.   aspirin 81 MG EC tablet Take 81 mg by mouth every other day.   calcium carbonate (TUMS - DOSED  IN MG ELEMENTAL CALCIUM) 500 MG chewable tablet Chew 1-2 tablets by mouth daily as needed for indigestion or heartburn.   Cholecalciferol (VITAMIN D3) 25 MCG (1000 UT) CAPS Take 2 capsules by mouth daily.   [employer] PURELAX 17 GM/SCOOP powder SMARTSIG:17 Gram(s) By Mouth Daily PRN   gabapentin (NEURONTIN) 100 MG capsule Take 1 capsule (100 mg total) by mouth 3 (three) times daily.   losartan (COZAAR) 25 MG tablet Take 1 tablet (25 mg total) by mouth daily.   NARCAN 4 MG/0.1ML LIQD nasal spray kit    oxyCODONE-acetaminophen (PERCOCET) 10-325 MG tablet Take 1 tablet by mouth every 4 (four) hours as needed for pain.   pantoprazole (PROTONIX) 40 MG tablet Take 40 mg by mouth daily.   pravastatin (PRAVACHOL) 80 MG tablet Take 1 tablet (80 mg total) by mouth daily.   traZODone (DESYREL) 50 MG tablet Take 0.5-1 tablets (25-50 mg total) by mouth at bedtime as needed for sleep.   vitamin B-12 (CYANOCOBALAMIN) 1000 MCG tablet Take 1 tablet (1,000 mcg  total) by mouth daily.    ROS:  Review of Systems  Constitutional:  Negative for fever.  HENT:  Positive for congestion (PND), ear pain (bilateral) and sore throat.   Respiratory:  Positive for cough. Negative for sputum production, shortness of breath and wheezing.   Cardiovascular:  Negative for chest pain.  Musculoskeletal:  Negative for myalgias.  Neurological:  Negative for headaches.    Objective:   Today's Vitals: BP 130/82 (BP Location: Left Arm, Patient Position: Sitting, Cuff Size: Large)    Pulse 83    Temp 98 F (36.7 C) (Oral)    Ht $R'5\' 5"'tx$  (1.651 m)    Wt 221 lb (100.2 kg)    SpO2 97%    BMI 36.78 kg/m  Vitals with BMI 09/13/2021 09/04/2021 08/15/2021  Height $Remov'5\' 5"'QUcKpW$  $Remove'5\' 5"'lufBWnV$  $RemoveB'5\' 5"'vEdQZdAa$   Weight 221 lbs 219 lbs 219 lbs  BMI 36.78 35.70 17.79  Systolic 390 300 923  Diastolic 82 72 78  Pulse 83 78 65     Physical Exam Vitals reviewed.  Constitutional:      General: She is not in acute distress.    Appearance: Normal appearance.  HENT:     Head:  Normocephalic and atraumatic.     Right Ear: Hearing, ear canal and external ear normal. A middle ear effusion is present.     Left Ear: Hearing, tympanic membrane, ear canal and external ear normal.  Neck:     Vascular: No carotid bruit.  Cardiovascular:     Rate and Rhythm: Normal rate and regular rhythm.     Pulses: Normal pulses.     Heart sounds: Normal heart sounds.  Pulmonary:     Effort: Pulmonary effort is normal.     Breath sounds: Normal breath sounds.  Lymphadenopathy:     Cervical: No cervical adenopathy.  Skin:    General: Skin is warm and dry.  Neurological:     General: No focal deficit present.     Mental Status: She is alert and oriented to person, place, and time.  Psychiatric:        Mood and Affect: Mood normal.        Behavior: Behavior normal.        Judgment: Judgment normal.   POC Strep A: Negative      Assessment and Plan   1. Viral upper respiratory tract infection   2. Sore throat      Plan: 1.,  2.  Recommend she treat symptoms with as needed Mucinex, Claritin, and Flonase nasal spray.  Offered cough suppressant but she does not want this at this time.  She was encouraged to call us if symptoms persist or worsen into next week.  Specifically if she spikes a fever, notices increased productive cough, or experiences shortness of breath/wheezing.  She reports understanding.   Tests ordered Orders Placed This Encounter  Procedures   POCT rapid strep A      No orders of the defined types were placed in this encounter.   Patient to follow-up if symptoms worsen or persist.  Ailene Ards, NP

## 2021-09-13 NOTE — Patient Instructions (Addendum)
Mucinex -DM - as needed for cough Claritin - take 1 tablet by mouth daily as needed Flonase nasal spray - 2 sprays in each nostil once a day  Call us if symptoms persist into next week

## 2021-10-24 ENCOUNTER — Other Ambulatory Visit: Payer: Self-pay

## 2021-10-24 ENCOUNTER — Encounter (INDEPENDENT_AMBULATORY_CARE_PROVIDER_SITE_OTHER): Payer: Managed Care, Other (non HMO) | Admitting: Ophthalmology

## 2021-10-24 DIAGNOSIS — H348312 Tributary (branch) retinal vein occlusion, right eye, stable: Secondary | ICD-10-CM | POA: Diagnosis not present

## 2021-10-24 DIAGNOSIS — I10 Essential (primary) hypertension: Secondary | ICD-10-CM

## 2021-10-24 DIAGNOSIS — H2513 Age-related nuclear cataract, bilateral: Secondary | ICD-10-CM

## 2021-10-24 DIAGNOSIS — H35033 Hypertensive retinopathy, bilateral: Secondary | ICD-10-CM | POA: Diagnosis not present

## 2021-10-24 DIAGNOSIS — H43813 Vitreous degeneration, bilateral: Secondary | ICD-10-CM | POA: Diagnosis not present

## 2021-10-28 ENCOUNTER — Encounter: Payer: Self-pay | Admitting: Gastroenterology

## 2021-11-20 ENCOUNTER — Encounter: Payer: Self-pay | Admitting: Internal Medicine

## 2021-11-20 ENCOUNTER — Ambulatory Visit (INDEPENDENT_AMBULATORY_CARE_PROVIDER_SITE_OTHER): Payer: Managed Care, Other (non HMO) | Admitting: Internal Medicine

## 2021-11-20 VITALS — BP 112/70 | HR 75 | Ht 65.0 in | Wt 221.0 lb

## 2021-11-20 DIAGNOSIS — K219 Gastro-esophageal reflux disease without esophagitis: Secondary | ICD-10-CM | POA: Diagnosis not present

## 2021-11-20 DIAGNOSIS — K2270 Barrett's esophagus without dysplasia: Secondary | ICD-10-CM | POA: Diagnosis not present

## 2021-11-20 DIAGNOSIS — Z86010 Personal history of colonic polyps: Secondary | ICD-10-CM | POA: Diagnosis not present

## 2021-11-20 MED ORDER — PANTOPRAZOLE SODIUM 40 MG PO TBEC: 40.0000 mg | DELAYED_RELEASE_TABLET | Freq: Every day | ORAL | 6 refills | Status: DC

## 2021-11-20 NOTE — Progress Notes (Signed)
? ?Chief Complaint: Gastric polyps, Barrett's esophagus, colon polyp ? ?HPI : 56 year old female with history of arthritis on opioids, possible Barrett's esophagus, and colon polyp presents with Barrett's esophagus, stomach polyps, and colon polyp ? ?Interval History: She feels alright, no major GI complaints. She has cut down on drinking sodas to once a day. She takes Tums on occasion for breakthrough reflux (twice per month). Denies diarrhea or ab pain. She does have occasional nausea once a month. She take Miralax because she is on pain medications. ? ?Wt Readings from Last 3 Encounters:  ?11/20/21 221 lb (100.2 kg)  ?09/13/21 221 lb (100.2 kg)  ?09/04/21 219 lb (99.3 kg)  ? ? ?Current Outpatient Medications  ?Medication Sig Dispense Refill  ? ALPRAZolam (XANAX) 1 MG tablet Take 1 tablet (1 mg total) by mouth 3 (three) times daily as needed. 90 tablet 2  ? aspirin 81 MG EC tablet Take 81 mg by mouth every other day.    ? calcium carbonate (TUMS - DOSED IN MG ELEMENTAL CALCIUM) 500 MG chewable tablet Chew 1-2 tablets by mouth daily as needed for indigestion or heartburn.    ? Cholecalciferol (VITAMIN D3) 25 MCG (1000 UT) CAPS Take 2 capsules by mouth daily.    ? [employer] PURELAX 17 GM/SCOOP powder SMARTSIG:17 Gram(s) By Mouth Daily PRN    ? diclofenac Sodium (VOLTAREN) 1 % GEL Apply 4 g topically 4 (four) times daily.    ? gabapentin (NEURONTIN) 100 MG capsule Take 1 capsule (100 mg total) by mouth 3 (three) times daily. 270 capsule 1  ? losartan (COZAAR) 25 MG tablet Take 1 tablet (25 mg total) by mouth daily. 90 tablet 3  ? NARCAN 4 MG/0.1ML LIQD nasal spray kit     ? oxyCODONE-acetaminophen (PERCOCET) 10-325 MG tablet Take 1 tablet by mouth every 4 (four) hours as needed for pain.    ? pantoprazole (PROTONIX) 40 MG tablet Take 40 mg by mouth daily.    ? pravastatin (PRAVACHOL) 80 MG tablet Take 1 tablet (80 mg total) by mouth daily. 90 tablet 3  ? traZODone (DESYREL) 50 MG tablet Take 0.5-1 tablets (25-50 mg total)  by mouth at bedtime as needed for sleep. 90 tablet 1  ? vitamin B-12 (CYANOCOBALAMIN) 1000 MCG tablet Take 1 tablet (1,000 mcg total) by mouth daily. 90 tablet 3  ? ?No current facility-administered medications for this visit.  ? ?Review of Systems: ?All systems reviewed and negative except where noted in HPI.  ? ?Physical Exam: ?BP 112/70   Pulse 75   Ht $R'5\' 5"'UX$  (1.651 m)   Wt 221 lb (100.2 kg)   BMI 36.78 kg/m?  ?Constitutional: Pleasant,well-developed, female in no acute distress. ?HEENT: Normocephalic and atraumatic. Conjunctivae are normal. No scleral icterus. ?Cardiovascular: Normal rate, regular rhythm.  ?Pulmonary/chest: Effort normal and breath sounds normal. No wheezing, rales or rhonchi. ?Abdominal: Soft, nondistended, nontender. Bowel sounds active throughout. There are no masses palpable. No hepatomegaly. ?Extremities: No edema ?Neurological: Alert and oriented to person place and time. ?Skin: Skin is warm and dry. No rashes noted. ?Psychiatric: Normal mood and affect. Behavior is normal. ? ?Labs 01/2021: CBC with elevated Hb of 15.7 and otherwise unremarkable. BMP nml, LFTs nml. TSH nml. ? ?EGD 10/19/06: ? ?Path: ?Fundic gland polyps ? ?EGD 06/09/19: ?Esophagus: Irregular Z-line at 39 cm with biopsies taken. Hiatal hernia noted.  Stricture at 39 cm from the incisors.  Esophagus was dilated using a 48 Fr savory dilator.  These were taken in the  proximal, mid, and distal esophagus for EOE. ?Stomach: Gastritis.  Biopsied ?Duodenum: Normal.  Biopsied ?Pathology: Negative for EOE, negative for celiac, negative for H. pylori, negative for Barrett's, mild gastritis. ?Per notes, patient's dysphagia improved after this esophageal dilation. ?  ?Patient has numerous EGDs (I count at least 7) done between 2012-2020, which showed esophagitis, gastritis, and gastric polyps (all fundic gland polyps by pathology). One EGD in 07/31/2016 did have Barrett's esophagus on pathology seen on biopsies. ?  ?Colonoscopy  10/01/11: ?Normal colonoscopy. Adequate prep. Recommended repeat colonoscopy in 5 years.  ? ?Colonoscopy 09/29/16:  ?One 8 mm polyp in the ascending colon removed with hot forceps. Inadequate prep. Recommended repeat colonoscopy in 5 years. Path showed hyperplastic polyp. Random colon biopsies were negative for microscopic colitis ? ?ASSESSMENT AND PLAN: ?Gastric polyps ?Colon polyps ?Barrett's esophagus ?GERD ?Patient's prior EGD and colonoscopy records were reviewed. We will plan to proceed with EGD and colonoscopy for Barrett's surveillance and colon polyp surveillance. Patient has also been noted to have gastric polyps in the past, which have all been benign. Refilled patient's PPI therapy. ?- Refill pantoprazole 40 mg QD ?- EGD/colonoscopy [facility] ? ?Christia Reading, MD ? ?

## 2021-11-20 NOTE — Patient Instructions (Signed)
We have sent the following medications to your pharmacy for you to pick up at your convenience:   Pantoprazole ? ?You have been scheduled for an endoscopy and colonoscopy. Please follow the written instructions given to you at your visit today. ?Please pick up your prep supplies at the pharmacy within the next 1-3 days. ?If you use inhalers (even only as needed), please bring them with you on the day of your procedure.  ? ?Due to recent changes in healthcare laws, you may see the results of your imaging and laboratory studies on MyChart before your provider has had a chance to review them.  We understand that in some cases there may be results that are confusing or concerning to you. Not all laboratory results come back in the same time frame and the provider may be waiting for multiple results in order to interpret others.  Please give [place] 48 hours in order for your provider to thoroughly review all the results before contacting the office for clarification of your results.   ? ?If you are age 16 or older, your body mass index should be between 23-30. Your Body mass index is 36.78 kg/m?Marland Kitchen If this is out of the aforementioned range listed, please consider follow up with your Primary Care Provider. ? ?If you are age 58 or younger, your body mass index should be between 19-25. Your Body mass index is 36.78 kg/m?Marland Kitchen If this is out of the aformentioned range listed, please consider follow up with your Primary Care Provider.  ? ?________________________________________________________ ? ?The [facility] providers would like to encourage you to use [facility] to communicate with providers for non-urgent requests or questions.  Due to long hold times on the telephone, sending your provider a message by [facility] may be a faster and more efficient way to get a response.  Please allow 48 business hours for a response.  Please remember that this is for non-urgent requests.  ?_______________________________________________________  ? ?I  appreciate the  opportunity to care for you ? ?Thank You  ? ?Sonny Masters Dorsey,MD ?

## 2021-11-28 ENCOUNTER — Other Ambulatory Visit: Payer: Self-pay | Admitting: Internal Medicine

## 2022-01-28 ENCOUNTER — Telehealth: Payer: Self-pay | Admitting: *Deleted

## 2022-01-28 NOTE — Telephone Encounter (Signed)
Dr.Dorsey,  This pt. Is scheduled with you on 6/13.  She is a documented difficult intubation and her procedure will need to be done at the hospital.    Thanks,  Osvaldo Angst

## 2022-01-30 ENCOUNTER — Other Ambulatory Visit: Payer: Self-pay

## 2022-01-30 ENCOUNTER — Encounter (INDEPENDENT_AMBULATORY_CARE_PROVIDER_SITE_OTHER): Payer: Commercial Managed Care - HMO | Admitting: Ophthalmology

## 2022-01-30 DIAGNOSIS — H353111 Nonexudative age-related macular degeneration, right eye, early dry stage: Secondary | ICD-10-CM | POA: Diagnosis not present

## 2022-01-30 DIAGNOSIS — H35033 Hypertensive retinopathy, bilateral: Secondary | ICD-10-CM

## 2022-01-30 DIAGNOSIS — I10 Essential (primary) hypertension: Secondary | ICD-10-CM | POA: Diagnosis not present

## 2022-01-30 DIAGNOSIS — H43813 Vitreous degeneration, bilateral: Secondary | ICD-10-CM

## 2022-01-30 DIAGNOSIS — H348112 Central retinal vein occlusion, right eye, stable: Secondary | ICD-10-CM | POA: Diagnosis not present

## 2022-01-30 DIAGNOSIS — Z01818 Encounter for other preprocedural examination: Secondary | ICD-10-CM

## 2022-01-30 DIAGNOSIS — H2513 Age-related nuclear cataract, bilateral: Secondary | ICD-10-CM

## 2022-01-30 DIAGNOSIS — Z86010 Personal history of colonic polyps: Secondary | ICD-10-CM

## 2022-01-30 NOTE — Telephone Encounter (Signed)
Patient aware.

## 2022-01-30 NOTE — Telephone Encounter (Signed)
Offered 02/19/22. Patient declines. She has an appointment on 02/19/22 with another provider. Moved case for colonoscopy to 04/17/22 arrive at 9:00 am New instructions for Miralax split dose prep mailed to the patient. New referral placed for procedure at the hospital.

## 2022-01-31 ENCOUNTER — Telehealth: Payer: Self-pay | Admitting: Internal Medicine

## 2022-01-31 NOTE — Telephone Encounter (Signed)
Her colonoscopy at the  hospital was denied? Why?

## 2022-01-31 NOTE — Telephone Encounter (Signed)
Case has been denied by [employer] a peer to peer can be done and it has been scheduled for 6/12 '@4pm'$ , Dr Benjaman Lobe will be calling Dr Lorenso Courier for the peer to peer.  Please advise If that works or if it needs to be changed  [phone]

## 2022-01-31 NOTE — Telephone Encounter (Signed)
Dr Lorenso Courier The EGD was denied. "Per [employer]: Your healthcare provider told  us that they want to check (screen) the tube that connects your mouth and  the stomach (esophagus) for cells that may not be normal. The request  cannot be approved because: Your records do not show results of a prior endoscopy (a test that passes  a tiny tube with a camera on the end through your mouth and into your  stomach in order to see live pictures inside your body) that show a reason  this test is needed. Your records do not show results of a pathology report that show a reason  the requested study is needed. Follow up imaging is based on the length of the damaged tissues that  extend into your esophagus (the tube through which food travels from your  throat to your stomach).  -At three years when three centimeters (cm) or longer.  -At five years when less than three cm. We have told your doctor about  this. Please talk to your doctor if you have questions."  Can you take a peer to peer on 02/03/22 around 4pm?

## 2022-02-04 ENCOUNTER — Encounter: Payer: Managed Care, Other (non HMO) | Admitting: Internal Medicine

## 2022-02-19 ENCOUNTER — Other Ambulatory Visit: Payer: Self-pay | Admitting: Internal Medicine

## 2022-02-19 ENCOUNTER — Encounter: Payer: Self-pay | Admitting: Internal Medicine

## 2022-02-19 ENCOUNTER — Ambulatory Visit (INDEPENDENT_AMBULATORY_CARE_PROVIDER_SITE_OTHER): Payer: Commercial Managed Care - HMO | Admitting: Internal Medicine

## 2022-02-19 VITALS — BP 112/70 | HR 71 | Ht 65.0 in | Wt 219.8 lb

## 2022-02-19 DIAGNOSIS — R739 Hyperglycemia, unspecified: Secondary | ICD-10-CM

## 2022-02-19 DIAGNOSIS — I10 Essential (primary) hypertension: Secondary | ICD-10-CM | POA: Diagnosis not present

## 2022-02-19 DIAGNOSIS — E559 Vitamin D deficiency, unspecified: Secondary | ICD-10-CM | POA: Diagnosis not present

## 2022-02-19 DIAGNOSIS — E785 Hyperlipidemia, unspecified: Secondary | ICD-10-CM

## 2022-02-19 DIAGNOSIS — E0590 Thyrotoxicosis, unspecified without thyrotoxic crisis or storm: Secondary | ICD-10-CM

## 2022-02-19 DIAGNOSIS — E538 Deficiency of other specified B group vitamins: Secondary | ICD-10-CM

## 2022-02-19 DIAGNOSIS — E7800 Pure hypercholesterolemia, unspecified: Secondary | ICD-10-CM

## 2022-02-19 LAB — HEPATIC FUNCTION PANEL
ALT: 31 U/L (ref 0–35)
AST: 24 U/L (ref 0–37)
Albumin: 4.3 g/dL (ref 3.5–5.2)
Alkaline Phosphatase: 57 U/L (ref 39–117)
Bilirubin, Direct: 0.1 mg/dL (ref 0.0–0.3)
Total Bilirubin: 0.5 mg/dL (ref 0.2–1.2)
Total Protein: 7.3 g/dL (ref 6.0–8.3)

## 2022-02-19 LAB — LIPID PANEL
Cholesterol: 144 mg/dL (ref 0–200)
HDL: 44.9 mg/dL (ref >39.00)
LDL Cholesterol: 80 mg/dL (ref 0–99)
NonHDL: 99.33
Total CHOL/HDL Ratio: 3
Triglycerides: 98 mg/dL (ref 0.0–149.0)
VLDL: 19.6 mg/dL (ref 0.0–40.0)

## 2022-02-19 LAB — BASIC METABOLIC PANEL
BUN: 12 mg/dL (ref 6–23)
CO2: 28 mEq/L (ref 19–32)
Calcium: 9.9 mg/dL (ref 8.4–10.5)
Chloride: 104 mEq/L (ref 96–112)
Creatinine, Ser: 0.84 mg/dL (ref 0.40–1.20)
GFR: 77.51 mL/min (ref >60.00)
Glucose, Bld: 97 mg/dL (ref 70–99)
Potassium: 4 mEq/L (ref 3.5–5.1)
Sodium: 140 mEq/L (ref 135–145)

## 2022-02-19 LAB — CBC WITH DIFFERENTIAL/PLATELET
Basophils Absolute: 0.1 10*3/uL (ref 0.0–0.1)
Basophils Relative: 1 % (ref 0.0–3.0)
Eosinophils Absolute: 0.2 10*3/uL (ref 0.0–0.7)
Eosinophils Relative: 2.4 % (ref 0.0–5.0)
HCT: 45.2 % (ref 36.0–46.0)
Hemoglobin: 15 g/dL (ref 12.0–15.0)
Lymphocytes Relative: 37.7 % (ref 12.0–46.0)
Lymphs Abs: 2.7 10*3/uL (ref 0.7–4.0)
MCHC: 33.1 g/dL (ref 30.0–36.0)
MCV: 89.6 fl (ref 78.0–100.0)
Monocytes Absolute: 0.8 10*3/uL (ref 0.1–1.0)
Monocytes Relative: 10.6 % (ref 3.0–12.0)
Neutro Abs: 3.4 10*3/uL (ref 1.4–7.7)
Neutrophils Relative %: 48.3 % (ref 43.0–77.0)
Platelets: 247 10*3/uL (ref 150.0–400.0)
RBC: 5.05 Mil/uL (ref 3.87–5.11)
RDW: 12.9 % (ref 11.5–15.5)
WBC: 7 10*3/uL (ref 4.0–10.5)

## 2022-02-19 LAB — URINALYSIS, ROUTINE W REFLEX MICROSCOPIC
Bilirubin Urine: NEGATIVE
Ketones, ur: NEGATIVE
Leukocytes,Ua: NEGATIVE
Nitrite: NEGATIVE
Specific Gravity, Urine: 1.01 (ref 1.000–1.030)
Total Protein, Urine: NEGATIVE
Urine Glucose: NEGATIVE
Urobilinogen, UA: 1 (ref 0.0–1.0)
pH: 6 (ref 5.0–8.0)

## 2022-02-19 LAB — VITAMIN B12: Vitamin B-12: 1186 pg/mL — ABNORMAL HIGH (ref 211–911)

## 2022-02-19 LAB — TSH: TSH: 0.14 u[IU]/mL — ABNORMAL LOW (ref 0.35–5.50)

## 2022-02-19 LAB — HEMOGLOBIN A1C: Hgb A1c MFr Bld: 5.8 % (ref 4.6–6.5)

## 2022-02-19 LAB — VITAMIN D 25 HYDROXY (VIT D DEFICIENCY, FRACTURES): VITD: 58.55 ng/mL (ref 30.00–100.00)

## 2022-02-19 MED ORDER — PRAVASTATIN SODIUM 40 MG PO TABS: 40.0000 mg | ORAL_TABLET | Freq: Every day | ORAL | 3 refills | Status: DC

## 2022-02-19 NOTE — Progress Notes (Signed)
Patient ID: Pamela Gibson, female   DOB: 09/13/1965, 56 y.o.   MRN: [id number]        Chief Complaint: follow up HTN, HLD and hyperglycemia, low b12 and Vit D       HPI:  Pamela Gibson is a 56 y.o. female here overall doing ok,  Pt denies chest pain, increased sob or doe, wheezing, orthopnea, PND, increased LE swelling, palpitations, dizziness or syncope.   Pt denies polydipsia, polyuria, or new focal neuro s/s.    Pt denies fever, wt loss, night sweats, loss of appetite, or other constitutional symptoms  For EGD and colonoscpy in aug 2023 with Dr Lorenso Courier. Taking B12 daily  Admits to not taking higher dose pravachol 80 but is now taking the pravastatin 40 mg every day which she wasn't doing before.   Wt Readings from Last 3 Encounters:  02/19/22 219 lb 12.8 oz (99.7 kg)  11/20/21 221 lb (100.2 kg)  09/13/21 221 lb (100.2 kg)   BP Readings from Last 3 Encounters:  02/19/22 112/70  11/20/21 112/70  09/13/21 130/82         Past Medical History:  Diagnosis Date   Anxiety    BACK PAIN 07/23/2007   Barrett's esophagus    CHEST PAIN 09/04/2010   Colon adenoma    Cyst of right ovary 07/03/2015   DEGENERATIVE JOINT DISEASE, LUMBAR SPINE 07/23/2007   Depression    Gastric polyp    x3   GERD 07/22/2007   H/O total hysterectomy    HYPERLIPIDEMIA 07/22/2007   IBS 07/23/2007   MITRAL VALVE PROLAPSE 07/22/2007   Pinched nerve    LEFT SIDE, DISC 3,4,5   Pneumonia    YEARS AGO   SINUSITIS- ACUTE-NOS 09/09/2007   TACHYCARDIA 08/20/2010   Tuberculosis     MOTHER HAD YEARS AGO   Unspecified Vaginitis and Vulvovaginitis 12/17/2007   Past Surgical History:  Procedure Laterality Date   ABDOMINAL HYSTERECTOMY     CHOLECYSTECTOMY     CYSTOSCOPY  11/11/2017   Procedure: CYSTOSCOPY;  Surgeon: Janyth Pupa, DO;  Location: [facility];  Service: Gynecology;;   LAPAROSCOPIC BILATERAL SALPINGO OOPHERECTOMY Bilateral 11/11/2017   Procedure: LAPAROSCOPIC BILATERAL SALPINGO OOPHORECTOMY;  Surgeon:  Janyth Pupa, DO;  Location: [facility];  Service: Gynecology;  Laterality: Bilateral;   RLE tib/fib fx/surgury     TONSILLECTOMY     TUBAL LIGATION      reports that she has never smoked. She has never used smokeless tobacco. She reports current alcohol use. She reports that she does not use drugs. family history includes Angina in her mother; Colon polyps in her mother; Coronary artery disease in her mother; Diabetes in an other family member; Heart disease in her mother; Hyperlipidemia in an other family member; Hypertension in an other family member; Stroke in her mother. Allergies  Allergen Reactions   Atorvastatin     REACTION: more forgetful   Ibuprofen     INSTRUCTED BY MD NOT TO TAKE DUE TO GI UPSET   Morphine Sulfate Er Other (See Comments)   Penicillin G Other (See Comments)   Penicillins     turns blue, childhood allergy Has patient had a PCN reaction causing immediate rash, facial/tongue/throat swelling, SOB or lightheadedness with hypotension: Yes Has patient had a PCN reaction causing severe rash involving mucus membranes or skin necrosis: No Has patient had a PCN reaction that required hospitalization: Unknown  Has patient had a PCN reaction occurring within the last 10 years: No If all  of the above answers are "NO", then may proceed with Cephalosporin use.    Simvastatin     REACTION: more forgetful   Tetracycline     REACTION: itch   Tetracycline Hcl Other (See Comments)   Current Outpatient Medications on File Prior to Visit  Medication Sig Dispense Refill   ALPRAZolam (XANAX) 1 MG tablet Take 1 tablet by mouth three times daily as needed 90 tablet 2   aspirin 81 MG EC tablet Take 81 mg by mouth every other day.     calcium carbonate (TUMS - DOSED IN MG ELEMENTAL CALCIUM) 500 MG chewable tablet Chew 1-2 tablets by mouth daily as needed for indigestion or heartburn.     Cholecalciferol (VITAMIN D3) 25 MCG (1000 UT) CAPS Take 2 capsules by mouth daily.     [employer]  PURELAX 17 GM/SCOOP powder SMARTSIG:17 Gram(s) By Mouth Daily PRN     diclofenac Sodium (VOLTAREN) 1 % GEL Apply 4 g topically 4 (four) times daily.     gabapentin (NEURONTIN) 100 MG capsule Take 1 capsule (100 mg total) by mouth 3 (three) times daily. 270 capsule 1   losartan (COZAAR) 25 MG tablet Take 1 tablet (25 mg total) by mouth daily. 90 tablet 3   NARCAN 4 MG/0.1ML LIQD nasal spray kit      oxyCODONE-acetaminophen (PERCOCET) 10-325 MG tablet Take 1 tablet by mouth every 4 (four) hours as needed for pain.     pantoprazole (PROTONIX) 40 MG tablet Take 1 tablet (40 mg total) by mouth daily. 30 tablet 6   traZODone (DESYREL) 50 MG tablet Take 0.5-1 tablets (25-50 mg total) by mouth at bedtime as needed for sleep. 90 tablet 1   vitamin B-12 (CYANOCOBALAMIN) 1000 MCG tablet Take 1 tablet (1,000 mcg total) by mouth daily. 90 tablet 3   No current facility-administered medications on file prior to visit.        ROS:  All others reviewed and negative.  Objective        PE:  BP 112/70 (BP Location: Left Arm, Patient Position: Sitting, Cuff Size: Large)   Pulse 71   Ht $R'5\' 5"'cb$  (1.651 m)   Wt 219 lb 12.8 oz (99.7 kg)   SpO2 96%   BMI 36.58 kg/m                 Constitutional: Pt appears in NAD               HENT: Head: NCAT.                Right Ear: External ear normal.                 Left Ear: External ear normal.                Eyes: . Pupils are equal, round, and reactive to light. Conjunctivae and EOM are normal               Nose: without d/c or deformity               Neck: Neck supple. Gross normal ROM               Cardiovascular: Normal rate and regular rhythm.                 Pulmonary/Chest: Effort normal and breath sounds without rales or wheezing.                Abd:  Soft, NT,  ND, + BS, no organomegaly               Neurological: Pt is alert. At baseline orientation, motor grossly intact               Skin: Skin is warm. No rashes, no other new lesions, LE edema - none                Psychiatric: Pt behavior is normal without agitation   Micro: none  Cardiac tracings I have personally interpreted today:  none  Pertinent Radiological findings (summarize): none   Lab Results  Component Value Date   WBC 7.0 02/19/2022   HGB 15.0 02/19/2022   HCT 45.2 02/19/2022   PLT 247.0 02/19/2022   GLUCOSE 97 02/19/2022   CHOL 144 02/19/2022   TRIG 98.0 02/19/2022   HDL 44.90 02/19/2022   LDLDIRECT 128.8 07/18/2008   LDLCALC 80 02/19/2022   ALT 31 02/19/2022   AST 24 02/19/2022   NA 140 02/19/2022   K 4.0 02/19/2022   CL 104 02/19/2022   CREATININE 0.84 02/19/2022   BUN 12 02/19/2022   CO2 28 02/19/2022   TSH 0.14 (L) 02/19/2022   HGBA1C 5.8 02/19/2022   MICROALBUR <0.7 08/10/2019   Assessment/Plan:  KELLER MIKELS is a 56 y.o. American [place] or [place] Native [3] female with  has a past medical history of Anxiety, BACK PAIN (07/23/2007), Barrett's esophagus, CHEST PAIN (09/04/2010), Colon adenoma, Cyst of right ovary (07/03/2015), DEGENERATIVE JOINT DISEASE, LUMBAR SPINE (07/23/2007), Depression, Gastric polyp, GERD (07/22/2007), H/O total hysterectomy, HYPERLIPIDEMIA (07/22/2007), IBS (07/23/2007), MITRAL VALVE PROLAPSE (07/22/2007), Pinched nerve, Pneumonia, SINUSITIS- ACUTE-NOS (09/09/2007), TACHYCARDIA (08/20/2010), Tuberculosis, and Unspecified Vaginitis and Vulvovaginitis (12/17/2007).  Hyperlipidemia Lab Results  Component Value Date   LDLCALC 91 08/15/2021   Uncontrolled goal ldl < 70, pt tolerating increased pravachol 40 mg qd (never took the 80 mg), for f/u lab today   Vitamin D deficiency Last vitamin D Lab Results  Component Value Date   VD25OH 58.55 02/19/2022   Stable, cont oral replacement  Vitamin B12 deficiency Lab Results  Component Value Date   VITAMINB12 1,186 (H) 02/19/2022   overcontrolled, cont oral replacement - b12 1000 mcg at mon-wed-fri only  Hyperglycemia Lab Results  Component Value Date   HGBA1C 5.8  02/19/2022   Stable, pt to continue current medical treatment  - diet, wt contrl, activity   HTN (hypertension) BP Readings from Last 3 Encounters:  02/19/22 112/70  11/20/21 112/70  09/13/21 130/82   Stable, pt to continue medical treatment losartan 25 mg qd  Followup: Return in about 6 months (around 08/21/2022).  Cathlean Cower, MD 02/22/2022 9:33 PM [facility]

## 2022-02-19 NOTE — Patient Instructions (Signed)
Ok to decrease the B12 to Mon - Wed- Fri  Please call if you would like to consider the Cardiac CT score testing, or the rybelsus 3 mg for wt loww  Please continue all other medications as before, and refills have been done if requested.  Please have the pharmacy call with any other refills you may need.  Please continue your efforts at being more active, low cholesterol diet, and weight control.  Please keep your appointments with your specialists as you may have planned  Please go to the LAB at the blood drawing area for the tests to be done  You will be contacted by phone if any changes need to be made immediately.  Otherwise, you will receive a letter about your results with an explanation, but please check with MyChart first.  Please remember to sign up for MyChart if you have not done so, as this will be important to you in the future with finding out test results, communicating by private email, and scheduling acute appointments online when needed.  Please make an Appointment to return in 6 months, or sooner if needed

## 2022-02-19 NOTE — Assessment & Plan Note (Addendum)
Lab Results  Component Value Date   LDLCALC 91 08/15/2021   Uncontrolled goal ldl < 70, pt tolerating increased pravachol 40 mg qd (never took the 80 mg), for f/u lab today

## 2022-02-22 ENCOUNTER — Encounter: Payer: Self-pay | Admitting: Internal Medicine

## 2022-02-22 NOTE — Assessment & Plan Note (Signed)
Lab Results  Component Value Date   HGBA1C 5.8 02/19/2022   Stable, pt to continue current medical treatment  - diet, wt contrl, activity

## 2022-02-22 NOTE — Assessment & Plan Note (Signed)
Lab Results  Component Value Date   VITAMINB12 1,186 (H) 02/19/2022   overcontrolled, cont oral replacement - b12 1000 mcg at mon-wed-fri only

## 2022-02-22 NOTE — Assessment & Plan Note (Signed)
Last vitamin D Lab Results  Component Value Date   VD25OH 58.55 02/19/2022   Stable, cont oral replacement  

## 2022-02-22 NOTE — Assessment & Plan Note (Signed)
BP Readings from Last 3 Encounters:  02/19/22 112/70  11/20/21 112/70  09/13/21 130/82   Stable, pt to continue medical treatment losartan 25 mg qd

## 2022-03-07 ENCOUNTER — Other Ambulatory Visit (HOSPITAL_COMMUNITY): Payer: Self-pay

## 2022-03-07 MED ORDER — OXYCODONE-ACETAMINOPHEN 10-325 MG PO TABS
ORAL_TABLET | ORAL | 0 refills | Status: DC
  Filled 2022-03-07: qty 180, 30d supply, fill #0

## 2022-04-02 ENCOUNTER — Other Ambulatory Visit: Payer: Self-pay | Admitting: Internal Medicine

## 2022-04-02 ENCOUNTER — Other Ambulatory Visit (HOSPITAL_COMMUNITY): Payer: Self-pay

## 2022-04-02 MED ORDER — OXYCODONE-ACETAMINOPHEN 10-325 MG PO TABS
ORAL_TABLET | ORAL | 0 refills | Status: AC
  Filled 2022-04-02 – 2022-04-04 (×2): qty 180, 30d supply, fill #0

## 2022-04-02 MED ORDER — LIDOCAINE 5 % EX PTCH
MEDICATED_PATCH | CUTANEOUS | 0 refills | Status: DC
  Filled 2022-04-02: qty 270, 90d supply, fill #0

## 2022-04-04 ENCOUNTER — Other Ambulatory Visit (HOSPITAL_COMMUNITY): Payer: Self-pay

## 2022-04-10 ENCOUNTER — Encounter (HOSPITAL_COMMUNITY): Payer: Self-pay | Admitting: Internal Medicine

## 2022-04-17 ENCOUNTER — Encounter (HOSPITAL_COMMUNITY): Payer: Self-pay | Admitting: Internal Medicine

## 2022-04-17 ENCOUNTER — Other Ambulatory Visit: Payer: Self-pay

## 2022-04-17 ENCOUNTER — Ambulatory Visit (HOSPITAL_BASED_OUTPATIENT_CLINIC_OR_DEPARTMENT_OTHER): Payer: Commercial Managed Care - HMO | Admitting: Certified Registered Nurse Anesthetist

## 2022-04-17 ENCOUNTER — Encounter (HOSPITAL_COMMUNITY)
Admission: RE | Disposition: A | Discharge status: Home / Self Care | Payer: Self-pay | Source: Home / Self Care | Attending: Internal Medicine

## 2022-04-17 ENCOUNTER — Ambulatory Visit (HOSPITAL_COMMUNITY)
Admission: RE | Admit: 2022-04-17 | Discharge: 2022-04-17 | Disposition: A | Discharge status: Home / Self Care | Payer: Commercial Managed Care - HMO | Attending: Internal Medicine | Admitting: Internal Medicine

## 2022-04-17 ENCOUNTER — Ambulatory Visit (HOSPITAL_COMMUNITY): Payer: Commercial Managed Care - HMO | Admitting: Certified Registered Nurse Anesthetist

## 2022-04-17 DIAGNOSIS — K2270 Barrett's esophagus without dysplasia: Secondary | ICD-10-CM

## 2022-04-17 DIAGNOSIS — Z8371 Family history of colonic polyps: Secondary | ICD-10-CM | POA: Diagnosis not present

## 2022-04-17 DIAGNOSIS — D124 Benign neoplasm of descending colon: Secondary | ICD-10-CM | POA: Insufficient documentation

## 2022-04-17 DIAGNOSIS — F32A Depression, unspecified: Secondary | ICD-10-CM | POA: Insufficient documentation

## 2022-04-17 DIAGNOSIS — K317 Polyp of stomach and duodenum: Secondary | ICD-10-CM | POA: Insufficient documentation

## 2022-04-17 DIAGNOSIS — K635 Polyp of colon: Secondary | ICD-10-CM | POA: Diagnosis not present

## 2022-04-17 DIAGNOSIS — D123 Benign neoplasm of transverse colon: Secondary | ICD-10-CM | POA: Diagnosis not present

## 2022-04-17 DIAGNOSIS — I10 Essential (primary) hypertension: Secondary | ICD-10-CM | POA: Insufficient documentation

## 2022-04-17 DIAGNOSIS — F419 Anxiety disorder, unspecified: Secondary | ICD-10-CM | POA: Diagnosis not present

## 2022-04-17 DIAGNOSIS — Z833 Family history of diabetes mellitus: Secondary | ICD-10-CM | POA: Insufficient documentation

## 2022-04-17 DIAGNOSIS — K449 Diaphragmatic hernia without obstruction or gangrene: Secondary | ICD-10-CM | POA: Insufficient documentation

## 2022-04-17 DIAGNOSIS — K648 Other hemorrhoids: Secondary | ICD-10-CM | POA: Insufficient documentation

## 2022-04-17 DIAGNOSIS — Z86010 Personal history of colonic polyps: Secondary | ICD-10-CM | POA: Diagnosis not present

## 2022-04-17 DIAGNOSIS — M1990 Unspecified osteoarthritis, unspecified site: Secondary | ICD-10-CM | POA: Insufficient documentation

## 2022-04-17 DIAGNOSIS — Z09 Encounter for follow-up examination after completed treatment for conditions other than malignant neoplasm: Secondary | ICD-10-CM | POA: Diagnosis not present

## 2022-04-17 DIAGNOSIS — K2970 Gastritis, unspecified, without bleeding: Secondary | ICD-10-CM

## 2022-04-17 DIAGNOSIS — Z1211 Encounter for screening for malignant neoplasm of colon: Secondary | ICD-10-CM | POA: Diagnosis not present

## 2022-04-17 DIAGNOSIS — Z79899 Other long term (current) drug therapy: Secondary | ICD-10-CM | POA: Diagnosis not present

## 2022-04-17 DIAGNOSIS — Z8249 Family history of ischemic heart disease and other diseases of the circulatory system: Secondary | ICD-10-CM | POA: Diagnosis not present

## 2022-04-17 DIAGNOSIS — K5730 Diverticulosis of large intestine without perforation or abscess without bleeding: Secondary | ICD-10-CM

## 2022-04-17 DIAGNOSIS — K219 Gastro-esophageal reflux disease without esophagitis: Secondary | ICD-10-CM | POA: Diagnosis not present

## 2022-04-17 HISTORY — PX: POLYPECTOMY: SHX5525

## 2022-04-17 HISTORY — PX: BIOPSY: SHX5522

## 2022-04-17 HISTORY — PX: ESOPHAGOGASTRODUODENOSCOPY (EGD) WITH PROPOFOL: SHX5813

## 2022-04-17 HISTORY — PX: COLONOSCOPY WITH PROPOFOL: SHX5780

## 2022-04-17 SURGERY — ESOPHAGOGASTRODUODENOSCOPY (EGD) WITH PROPOFOL
Anesthesia: Monitor Anesthesia Care

## 2022-04-17 MED ORDER — DEXMEDETOMIDINE (PRECEDEX) IN NS 20 MCG/5ML (4 MCG/ML) IV SYRINGE
PREFILLED_SYRINGE | INTRAVENOUS | Status: DC | PRN
  Administered 2022-04-17: 8 ug via INTRAVENOUS

## 2022-04-17 MED ORDER — PROPOFOL 500 MG/50ML IV EMUL
INTRAVENOUS | Status: DC | PRN
  Administered 2022-04-17: 125 ug/kg/min via INTRAVENOUS

## 2022-04-17 MED ORDER — GLYCOPYRROLATE 0.2 MG/ML IJ SOLN
INTRAMUSCULAR | Status: DC | PRN
  Administered 2022-04-17: .2 mg via INTRAVENOUS

## 2022-04-17 MED ORDER — LACTATED RINGERS IV SOLN: INTRAVENOUS | Status: DC

## 2022-04-17 MED ORDER — LIDOCAINE 2% (20 MG/ML) 5 ML SYRINGE
INTRAMUSCULAR | Status: DC | PRN
  Administered 2022-04-17: 100 mg via INTRAVENOUS

## 2022-04-17 SURGICAL SUPPLY — 25 items

## 2022-04-17 NOTE — Anesthesia Procedure Notes (Addendum)
Procedure Name: MAC Date/Time: 04/17/2022 10:02 AM  Performed by: West Pugh, CRNAPre-anesthesia Checklist: Patient identified, Emergency Drugs available, Suction available, Patient being monitored and Timeout performed Oxygen Delivery Method: Simple face mask Placement Confirmation: positive ETCO2 Dental Injury: Teeth and Oropharynx as per pre-operative assessment

## 2022-04-17 NOTE — Op Note (Signed)
[facility] Patient Name: Pamela Gibson Procedure Date: 04/17/2022 MRN: [id number] Attending MD: Georgian Co ,  Date of Birth: 1966-03-03 CSN: [id number] Age: 56 Admit Type: Outpatient Procedure:                Colonoscopy Indications:              High risk colon cancer surveillance: Personal                            history of colonic polyps Providers:                Adline Mango" Georga Hacking, RN, Cletis Athens, Technician Referring MD:             Biagio Borg, MD Medicines:                Monitored Anesthesia Care Complications:            No immediate complications. Estimated Blood Loss:     Estimated blood loss was minimal. Procedure:                Pre-Anesthesia Assessment:                           - Prior to the procedure, a History and Physical                            was performed, and patient medications and                            allergies were reviewed. The patient's tolerance of                            previous anesthesia was also reviewed. The risks                            and benefits of the procedure and the sedation                            options and risks were discussed with the patient.                            All questions were answered, and informed consent                            was obtained. Prior Anticoagulants: The patient has                            taken no previous anticoagulant or antiplatelet                            agents. ASA Grade Assessment: III - A patient with  severe systemic disease. After reviewing the risks                            and benefits, the patient was deemed in                            satisfactory condition to undergo the procedure.                           After obtaining informed consent, the colonoscope                            was passed under direct vision. Throughout the                             procedure, the patient's blood pressure, pulse, and                            oxygen saturations were monitored continuously. The                            [id number]-HQ190L ([id number]) Olympus colonoscope was                            introduced through the anus and advanced to the the                            terminal ileum. The colonoscopy was performed                            without difficulty. The patient tolerated the                            procedure well. The quality of the bowel                            preparation was good. The terminal ileum, ileocecal                            valve, appendiceal orifice, and rectum were                            photographed. Scope In: 10:28:08 AM Scope Out: 10:56:13 AM Scope Withdrawal Time: 0 hours 19 minutes 56 seconds  Total Procedure Duration: 0 hours 28 minutes 5 seconds  Findings:      The terminal ileum appeared normal.      Three sessile polyps were found in the descending colon and transverse       colon. The polyps were 2 to 6 mm in size. These polyps were removed with       a cold snare. Resection and retrieval were complete.      Multiple small and large-mouthed diverticula were found in the sigmoid       colon.      Non-bleeding internal hemorrhoids were found during retroflexion. Impression:               -  The examined portion of the ileum was normal.                           - Three 2 to 6 mm polyps in the descending colon                            and in the transverse colon, removed with a cold                            snare. Resected and retrieved.                           - Diverticulosis in the sigmoid colon.                           - Non-bleeding internal hemorrhoids. Moderate Sedation:      Not Applicable - Patient had care per Anesthesia. Recommendation:           - Discharge patient to home (with escort).                           - Await pathology results.                           - The findings  and recommendations were discussed                            with the patient. Procedure Code(s):        --- Professional ---                           [phone], Colonoscopy, flexible; with removal of                            tumor(s), polyp(s), or other lesion(s) by snare                            technique Diagnosis Code(s):        --- Professional ---                           K64.8, Other hemorrhoids                           K63.5, Polyp of colon                           K57.30, Diverticulosis of large intestine without                            perforation or abscess without bleeding CPT copyright 2019 American Medical Association. All rights reserved. The codes documented in this report are preliminary and upon coder review may  be revised to meet current compliance requirements. Dr Georgian Co "Lyndee Leo" Lorenso Courier,  04/17/2022 11:34:09 AM Number of Addenda: 0

## 2022-04-17 NOTE — Anesthesia Postprocedure Evaluation (Signed)
Anesthesia Post Note  Patient: Pamela Gibson  Procedure(s) Performed: ESOPHAGOGASTRODUODENOSCOPY (EGD) WITH PROPOFOL COLONOSCOPY WITH PROPOFOL BIOPSY POLYPECTOMY     Patient location during evaluation: PACU Anesthesia Type: MAC Level of consciousness: awake and alert Pain management: pain level controlled Vital Signs Assessment: post-procedure vital signs reviewed and stable Respiratory status: spontaneous breathing, nonlabored ventilation and respiratory function stable Cardiovascular status: stable and blood pressure returned to baseline Anesthetic complications: no   No notable events documented.  Last Vitals:  Vitals:   04/17/22 1120 04/17/22 1130  BP: 114/69 114/69  Pulse: (!) 59 (!) 56  Resp: 20 10  Temp:    SpO2: 97% 99%    Last Pain:  Vitals:   04/17/22 1130  TempSrc:   PainSc: 0-No pain                 Audry Pili

## 2022-04-17 NOTE — Anesthesia Preprocedure Evaluation (Addendum)
Anesthesia Evaluation  Patient identified by MRN, date of birth, ID band Patient awake    Reviewed: Allergy & Precautions, NPO status , Patient's Chart, lab work & pertinent test results  History of Anesthesia Complications Negative for: history of anesthetic complications  Airway Mallampati: III  TM Distance: >3 FB Neck ROM: Full    Dental  (+) Dental Advisory Given, Teeth Intact   Pulmonary neg pulmonary ROS,    Pulmonary exam normal        Cardiovascular hypertension, Pt. on medications Normal cardiovascular exam   '20 Myoperfusion - The left ventricular ejection fraction is hyperdynamic (>65%). Nuclear stress EF: 66%. There was no ST segment deviation noted during stress. The study is normal. This is a low risk study.     Neuro/Psych  Headaches, PSYCHIATRIC DISORDERS Anxiety Depression  Vertigo     GI/Hepatic Neg liver ROS, GERD  Controlled and Medicated,  Endo/Other  negative endocrine ROS  Renal/GU negative Renal ROS     Musculoskeletal  (+) Arthritis ,   Abdominal   Peds  Hematology negative hematology ROS (+)   Anesthesia Other Findings Chronic pain   Reproductive/Obstetrics  s/p hysterectomy                             Anesthesia Physical Anesthesia Plan  ASA: 2  Anesthesia Plan: MAC   Post-op Pain Management:    Induction:   PONV Risk Score and Plan: 2 and Propofol infusion and Treatment may vary due to age or medical condition  Airway Management Planned: Nasal Cannula and Natural Airway  Additional Equipment: None  Intra-op Plan:   Post-operative Plan:   Informed Consent: I have reviewed the patients History and Physical, chart, labs and discussed the procedure including the risks, benefits and alternatives for the proposed anesthesia with the patient or authorized representative who has indicated his/her understanding and acceptance.       Plan  Discussed with: CRNA and Anesthesiologist  Anesthesia Plan Comments:        Anesthesia Quick Evaluation

## 2022-04-17 NOTE — Transfer of Care (Signed)
Immediate Anesthesia Transfer of Care Note  Patient: Pamela Gibson  Procedure(s) Performed: ESOPHAGOGASTRODUODENOSCOPY (EGD) WITH PROPOFOL COLONOSCOPY WITH PROPOFOL BIOPSY POLYPECTOMY  Patient Location: Endoscopy Unit  Anesthesia Type:MAC  Level of Consciousness: drowsy and patient cooperative  Airway & Oxygen Therapy: Patient Spontanous Breathing and Patient connected to face mask oxygen  Post-op Assessment: Report given to RN and Post -op Vital signs reviewed and stable  Post vital signs: Reviewed and stable  Last Vitals:  Vitals Value Taken Time  BP 111/70 04/17/22 1104  Temp 36.6 C 04/17/22 1104  Pulse 80 04/17/22 1105  Resp 22 04/17/22 1105  SpO2 100 % 04/17/22 1105  Vitals shown include unvalidated device data.  Last Pain:  Vitals:   04/17/22 1104  TempSrc: Temporal  PainSc: 0-No pain         Complications: No notable events documented.

## 2022-04-17 NOTE — Discharge Instructions (Signed)
YOU HAD AN ENDOSCOPIC PROCEDURE TODAY: Refer to the procedure report and other information in the discharge instructions given to you for any specific questions about what was found during the examination. If this information does not answer your questions, please call [place] office at [phone] to clarify.  ° °YOU SHOULD EXPECT: Some feelings of bloating in the abdomen. Passage of more gas than usual. Walking can help get rid of the air that was put into your GI tract during the procedure and reduce the bloating. If you had a lower endoscopy (such as a colonoscopy or flexible sigmoidoscopy) you may notice spotting of blood in your stool or on the toilet paper. Some abdominal soreness may be present for a day or two, also. ° °DIET: Your first meal following the procedure should be a light meal and then it is ok to progress to your normal diet. A half-sandwich or bowl of soup is an example of a good first meal. Heavy or fried foods are harder to digest and may make you feel nauseous or bloated. Drink plenty of fluids but you should avoid alcoholic beverages for 24 hours. If you had a esophageal dilation, please see attached instructions for diet.   ° °ACTIVITY: Your care partner should take you home directly after the procedure. You should plan to take it easy, moving slowly for the rest of the day. You can resume normal activity the day after the procedure however YOU SHOULD NOT DRIVE, use power tools, machinery or perform tasks that involve climbing or major physical exertion for 24 hours (because of the sedation medicines used during the test).  ° °SYMPTOMS TO REPORT IMMEDIATELY: °A gastroenterologist can be reached at any hour. Please call [phone]  for any of the following symptoms:  °Following lower endoscopy (colonoscopy, flexible sigmoidoscopy) °Excessive amounts of blood in the stool  °Significant tenderness, worsening of abdominal pains  °Swelling of the abdomen that is new, acute  °Fever of 100° or  higher  °Following upper endoscopy (EGD, EUS, ERCP, esophageal dilation) °Vomiting of blood or coffee ground material  °New, significant abdominal pain  °New, significant chest pain or pain under the shoulder blades  °Painful or persistently difficult swallowing  °New shortness of breath  °Black, tarry-looking or red, bloody stools ° °FOLLOW UP:  °If any biopsies were taken you will be contacted by phone or by letter within the next 1-3 weeks. Call [phone]  if you have not heard about the biopsies in 3 weeks.  °Please also call with any specific questions about appointments or follow up tests. ° °

## 2022-04-17 NOTE — Op Note (Signed)
[facility] Patient Name: Pamela Gibson Procedure Date: 04/17/2022 MRN: [id number] Attending MD: Georgian Co ,  Date of Birth: 1966-07-18 CSN: [id number] Age: 56 Admit Type: Outpatient Procedure:                Upper GI endoscopy Indications:              Follow-up of Barrett's esophagus Providers:                Adline Mango" Georga Hacking, RN, Cletis Athens, Technician Referring MD:             Biagio Borg, MD Medicines:                Monitored Anesthesia Care Complications:            No immediate complications. Estimated Blood Loss:     Estimated blood loss was minimal. Procedure:                Pre-Anesthesia Assessment:                           - Prior to the procedure, a History and Physical                            was performed, and patient medications and                            allergies were reviewed. The patient's tolerance of                            previous anesthesia was also reviewed. The risks                            and benefits of the procedure and the sedation                            options and risks were discussed with the patient.                            All questions were answered, and informed consent                            was obtained. Prior Anticoagulants: The patient has                            taken no previous anticoagulant or antiplatelet                            agents. ASA Grade Assessment: III - A patient with                            severe systemic disease. After reviewing the risks  and benefits, the patient was deemed in                            satisfactory condition to undergo the procedure.                           After obtaining informed consent, the endoscope was                            passed under direct vision. Throughout the                            procedure, the patient's blood pressure, pulse, and                             oxygen saturations were monitored continuously. The                            [id number]-H190 ([id number]) Olympus endoscope was introduced                            through the mouth, and advanced to the second part                            of duodenum. The upper GI endoscopy was                            accomplished without difficulty. The patient                            tolerated the procedure well. Scope In: Scope Out: Findings:      Salmon-colored mucosa was present. The maximum longitudinal extent of       these esophageal mucosal changes was 1 cm in length. Mucosa was biopsied       with a cold forceps for histology. One specimen bottle was sent to       pathology.      A small hiatal hernia was present.      Eight 2 to 10 mm sessile polyps with no bleeding and no stigmata of       recent bleeding were found in the gastric body. These polyps were       removed with a cold snare. Resection and retrieval were complete.      Localized mild inflammation characterized by congestion (edema),       erosions and erythema was found in the gastric antrum. Biopsies were       taken with a cold forceps for histology.      The examined duodenum was normal. Impression:               - Salmon-colored mucosa classified as Barrett's                            stage C0-M1 per [place] criteria. Biopsied.                           - Small hiatal  hernia.                           - Eight gastric polyps. Resected and retrieved.                           - Gastritis. Biopsied.                           - Normal examined duodenum. Moderate Sedation:      Not Applicable - Patient had care per Anesthesia. Recommendation:           - Await pathology results.                           - Perform a colonoscopy today. Procedure Code(s):        --- Professional ---                           [phone], Esophagogastroduodenoscopy, flexible,                            transoral; with biopsy, single or  multiple Diagnosis Code(s):        --- Professional ---                           K22.70, Barrett's esophagus without dysplasia                           K44.9, Diaphragmatic hernia without obstruction or                            gangrene                           K29.70, Gastritis, unspecified, without bleeding CPT copyright 2019 American Medical Association. All rights reserved. The codes documented in this report are preliminary and upon coder review may  be revised to meet current compliance requirements. Dr Georgian Co "Lyndee Leo" Lorenso Courier,  04/17/2022 11:33:47 AM Number of Addenda: 0

## 2022-04-17 NOTE — H&P (Signed)
GASTROENTEROLOGY PROCEDURE H&P NOTE   Primary Care Physician: Biagio Borg, MD    Reason for Procedure:   Barrett's esophagus, colon polyp  Plan:    EGD/colonoscopy  Patient is appropriate for endoscopic procedure(s) in the hospital setting.  The nature of the procedure, as well as the risks, benefits, and alternatives were carefully and thoroughly reviewed with the patient. Ample time for discussion and questions allowed. The patient understood, was satisfied, and agreed to proceed.     HPI: Pamela Gibson is a 56 y.o. female who presents for EGD/colonoscopy for evaluation of Barrett's esophagus and colon polyp.  Patient was most recently seen in the Gastroenterology Clinic on 11/20/21.  No interval change in medical history since that appointment. Please refer to that note for full details regarding GI history and clinical presentation.   Past Medical History:  Diagnosis Date   Anxiety    BACK PAIN 07/23/2007   Barrett's esophagus    CHEST PAIN 09/04/2010   Colon adenoma    Cyst of right ovary 07/03/2015   DEGENERATIVE JOINT DISEASE, LUMBAR SPINE 07/23/2007   Depression    Gastric polyp    x3   GERD 07/22/2007   H/O total hysterectomy    HYPERLIPIDEMIA 07/22/2007   IBS 07/23/2007   MITRAL VALVE PROLAPSE 07/22/2007   Pinched nerve    LEFT SIDE, DISC 3,4,5   Pneumonia    YEARS AGO   SINUSITIS- ACUTE-NOS 09/09/2007   TACHYCARDIA 08/20/2010   Tuberculosis     MOTHER HAD YEARS AGO   Unspecified Vaginitis and Vulvovaginitis 12/17/2007    Past Surgical History:  Procedure Laterality Date   ABDOMINAL HYSTERECTOMY     CHOLECYSTECTOMY     CYSTOSCOPY  11/11/2017   Procedure: CYSTOSCOPY;  Surgeon: Janyth Pupa, DO;  Location: [facility];  Service: Gynecology;;   LAPAROSCOPIC BILATERAL SALPINGO OOPHERECTOMY Bilateral 11/11/2017   Procedure: LAPAROSCOPIC BILATERAL SALPINGO OOPHORECTOMY;  Surgeon: Janyth Pupa, DO;  Location: [facility];  Service: Gynecology;  Laterality:  Bilateral;   RLE tib/fib fx/surgury     TONSILLECTOMY     TUBAL LIGATION      Prior to Admission medications   Medication Sig Start Date End Date Taking? Authorizing Provider  ALPRAZolam Duanne Moron) 1 MG tablet Take 1 tablet by mouth three times daily as needed 04/02/22  Yes Biagio Borg, MD  aspirin 81 MG EC tablet Take 81 mg by mouth every other day.   Yes [provider]  calcium carbonate (TUMS - DOSED IN MG ELEMENTAL CALCIUM) 500 MG chewable tablet Chew 1-2 tablets by mouth daily as needed for indigestion or heartburn.   Yes [provider]  Cholecalciferol (VITAMIN D3) 25 MCG (1000 UT) CAPS Take 2,000 Units by mouth daily.   Yes [provider]  [employer] PURELAX 17 GM/SCOOP powder Take 0.5 Containers by mouth daily as needed for mild constipation or moderate constipation. 05/20/20  Yes [provider]  lidocaine (LIDODERM) 5 % Place 1-3 patches onto skin as directed. Patient taking differently: Place 1 patch onto the skin daily as needed (pain). 04/02/22  Yes   losartan (COZAAR) 25 MG tablet Take 1 tablet (25 mg total) by mouth daily. 09/04/21  Yes Biagio Borg, MD  Multiple Vitamins-Minerals (MULTIVITAMIN WITH MINERALS) tablet Take 1 tablet by mouth daily.   Yes [provider]  NARCAN 4 MG/0.1ML LIQD nasal spray kit Place 1 spray into the nose once. 10/10/20  Yes [provider]  oxyCODONE-acetaminophen (PERCOCET) 10-325 MG tablet Take  1 tablet by mouth every four hours as needed for pain 04/02/22  Yes   pantoprazole (PROTONIX) 40 MG tablet Take 1 tablet (40 mg total) by mouth daily. 11/20/21  Yes Sharyn Creamer, MD  pravastatin (PRAVACHOL) 40 MG tablet Take 1 tablet (40 mg total) by mouth daily. 02/19/22  Yes Biagio Borg, MD  traZODone (DESYREL) 50 MG tablet Take 0.5-1 tablets (25-50 mg total) by mouth at bedtime as needed for sleep. 09/04/21  Yes Biagio Borg, MD  vitamin B-12 (CYANOCOBALAMIN) 1000 MCG tablet Take 1 tablet (1,000 mcg total) by  mouth daily. Patient taking differently: Take 1,000 mcg by mouth 3 (three) times a week. 08/10/19  Yes Biagio Borg, MD  gabapentin (NEURONTIN) 100 MG capsule Take 1 capsule (100 mg total) by mouth 3 (three) times daily. Patient not taking: Reported on 04/11/2022 09/04/21   Biagio Borg, MD    Current Facility-Administered Medications  Medication Dose Route Frequency Provider Last Rate Last Admin   lactated ringers infusion   Intravenous Continuous Sharyn Creamer, MD 10 mL/hr at 04/17/22 7741 New Bag at 04/17/22 0918    Allergies as of 01/30/2022 - Review Complete 11/20/2021  Allergen Reaction Noted   Atorvastatin  07/25/2008   Ibuprofen  11/02/2017   Morphine sulfate er Other (See Comments) 09/22/2019   Penicillin g Other (See Comments) 09/22/2019   Penicillins  07/22/2007   Simvastatin  07/25/2008   Tetracycline  07/22/2007   Tetracycline hcl Other (See Comments) 09/22/2019    Family History  Problem Relation Age of Onset   Colon polyps Mother    Angina Mother    Heart disease Mother        CVA   Coronary artery disease Mother    Stroke Mother    Hypertension Other    Diabetes Other    Hyperlipidemia Other    Breast cancer Neg Hx    Esophageal cancer Neg Hx    Colon cancer Neg Hx    Pancreatic cancer Neg Hx    Stomach cancer Neg Hx    Liver disease Neg Hx     Social History   Socioeconomic History   Marital status: Married    Spouse name: Not on file   Number of children: 3   Years of education: Not on file   Highest education level: Not on file  Occupational History   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    Employer: [employer]  Tobacco Use   Smoking status: Never   Smokeless tobacco: Never  Vaping Use   Vaping Use: Never used  Substance and Sexual Activity   Alcohol use: Yes    Comment: occasional   Drug use: No   Sexual activity: Never  Other Topics Concern   Not on file  Social History Narrative   Not on file   Social  Determinants of Health   Financial Resource Strain: Not on file  Food Insecurity: Not on file  Transportation Needs: Not on file  Physical Activity: Not on file  Stress: Not on file  Social Connections: Not on file  Intimate Partner Violence: Not on file    Physical Exam: Vital signs in last 24 hours: BP (!) 167/103   Pulse 96   Temp 97.8 F (36.6 C) (Temporal)   Resp 16   Ht $R'5\' 5"'NG$  (1.651 m)   Wt 99.3 kg   SpO2 99%   BMI 36.44 kg/m  GEN: NAD EYE: Sclerae anicteric ENT: MMM CV:  Non-tachycardic Pulm: No increased WOB GI: Soft NEURO:  Alert & Oriented   Christia Reading, MD [facility]   04/17/2022 9:20 AM

## 2022-04-18 ENCOUNTER — Encounter: Payer: Self-pay | Admitting: Internal Medicine

## 2022-04-18 LAB — SURGICAL PATHOLOGY

## 2022-04-20 ENCOUNTER — Encounter (HOSPITAL_COMMUNITY): Payer: Self-pay | Admitting: Internal Medicine

## 2022-04-25 ENCOUNTER — Telehealth: Payer: Self-pay | Admitting: Internal Medicine

## 2022-04-25 NOTE — Telephone Encounter (Signed)
Pt calling requesting results from her recent procedure. Please advise, thank you

## 2022-04-30 NOTE — Telephone Encounter (Signed)
Letter mailed 04/18/22.

## 2022-05-01 ENCOUNTER — Other Ambulatory Visit (HOSPITAL_COMMUNITY): Payer: Self-pay

## 2022-05-01 MED ORDER — OXYCODONE-ACETAMINOPHEN 10-325 MG PO TABS
1.0000 | ORAL_TABLET | ORAL | 0 refills | Status: DC | PRN
  Filled 2022-05-01: qty 180, 30d supply, fill #0

## 2022-05-14 ENCOUNTER — Encounter: Payer: Self-pay | Admitting: Nurse Practitioner

## 2022-05-14 ENCOUNTER — Ambulatory Visit: Payer: Commercial Managed Care - HMO | Admitting: Nurse Practitioner

## 2022-05-14 VITALS — BP 115/79 | HR 68 | Ht 65.0 in | Wt 220.6 lb

## 2022-05-14 DIAGNOSIS — R7989 Other specified abnormal findings of blood chemistry: Secondary | ICD-10-CM | POA: Diagnosis not present

## 2022-05-14 NOTE — Progress Notes (Signed)
05/14/2022     Endocrinology Consult Note    Subjective:    Patient ID: Pamela Gibson, female    DOB: June 03, 1966, PCP Biagio Borg, MD.   Past Medical History:  Diagnosis Date   Anxiety    BACK PAIN 07/23/2007   Barrett's esophagus    CHEST PAIN 09/04/2010   Colon adenoma    Cyst of right ovary 07/03/2015   DEGENERATIVE JOINT DISEASE, LUMBAR SPINE 07/23/2007   Depression    Gastric polyp    x3   GERD 07/22/2007   H/O total hysterectomy    HYPERLIPIDEMIA 07/22/2007   IBS 07/23/2007   MITRAL VALVE PROLAPSE 07/22/2007   Pinched nerve    LEFT SIDE, DISC 3,4,5   Pneumonia    YEARS AGO   SINUSITIS- ACUTE-NOS 09/09/2007   TACHYCARDIA 08/20/2010   Tuberculosis     MOTHER HAD YEARS AGO   Unspecified Vaginitis and Vulvovaginitis 12/17/2007    Past Surgical History:  Procedure Laterality Date   ABDOMINAL HYSTERECTOMY     BIOPSY  04/17/2022   Procedure: BIOPSY;  Surgeon: Sharyn Creamer, MD;  Location: [facility];  Service: Gastroenterology;;   CHOLECYSTECTOMY     COLONOSCOPY WITH PROPOFOL N/A 04/17/2022   Procedure: COLONOSCOPY WITH PROPOFOL;  Surgeon: Sharyn Creamer, MD;  Location: [facility];  Service: Gastroenterology;  Laterality: N/A;   CYSTOSCOPY  11/11/2017   Procedure: CYSTOSCOPY;  Surgeon: Janyth Pupa, DO;  Location: [facility];  Service: Gynecology;;   ESOPHAGOGASTRODUODENOSCOPY (EGD) WITH PROPOFOL N/A 04/17/2022   Procedure: ESOPHAGOGASTRODUODENOSCOPY (EGD) WITH PROPOFOL;  Surgeon: Sharyn Creamer, MD;  Location: [facility];  Service: Gastroenterology;  Laterality: N/A;   LAPAROSCOPIC BILATERAL SALPINGO OOPHERECTOMY Bilateral 11/11/2017   Procedure: LAPAROSCOPIC BILATERAL SALPINGO OOPHORECTOMY;  Surgeon: Janyth Pupa, DO;  Location: [facility];  Service: Gynecology;  Laterality: Bilateral;   POLYPECTOMY  04/17/2022   Procedure: POLYPECTOMY;  Surgeon: Sharyn Creamer, MD;  Location: [facility];  Service: Gastroenterology;;   RLE tib/fib fx/surgury      TONSILLECTOMY     TUBAL LIGATION      Social History   Socioeconomic History   Marital status: Married    Spouse name: Not on file   Number of children: 3   Years of education: Not on file   Highest education level: Not on file  Occupational History   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    Employer: [employer]  Tobacco Use   Smoking status: Never   Smokeless tobacco: Never  Vaping Use   Vaping Use: Never used  Substance and Sexual Activity   Alcohol use: Yes    Comment: occasional   Drug use: No   Sexual activity: Never  Other Topics Concern   Not on file  Social History Narrative   Not on file   Social Determinants of Health   Financial Resource Strain: Not on file  Food Insecurity: Not on file  Transportation Needs: Not on file  Physical Activity: Not on file  Stress: Not on file  Social Connections: Not on file    Family History  Problem Relation Age of Onset   Colon polyps Mother    Angina Mother    Heart disease Mother        CVA   Coronary artery disease Mother    Stroke Mother    Hypertension Other    Diabetes Other    Hyperlipidemia Other    Breast cancer Neg Hx    Esophageal  cancer Neg Hx    Colon cancer Neg Hx    Pancreatic cancer Neg Hx    Stomach cancer Neg Hx    Liver disease Neg Hx     Outpatient Encounter Medications as of 05/14/2022  Medication Sig   ALPRAZolam (XANAX) 1 MG tablet Take 1 tablet by mouth three times daily as needed   aspirin 81 MG EC tablet Take 81 mg by mouth every other day.   calcium carbonate (TUMS - DOSED IN MG ELEMENTAL CALCIUM) 500 MG chewable tablet Chew 1-2 tablets by mouth daily as needed for indigestion or heartburn.   Cholecalciferol (VITAMIN D3) 25 MCG (1000 UT) CAPS Take 2,000 Units by mouth daily.   [employer]S PURELAX 17 GM/SCOOP powder Take 0.5 Containers by mouth daily as needed for mild constipation or moderate constipation.   lidocaine (LIDODERM) 5 % Place 1-3 patches onto skin as directed.  (Patient taking differently: Place 1 patch onto the skin daily as needed (pain).)   losartan (COZAAR) 25 MG tablet Take 1 tablet (25 mg total) by mouth daily.   Multiple Vitamins-Minerals (MULTIVITAMIN WITH MINERALS) tablet Take 1 tablet by mouth daily.   NARCAN 4 MG/0.1ML LIQD nasal spray kit Place 1 spray into the nose once.   oxyCODONE-acetaminophen (PERCOCET) 10-325 MG tablet Take 1 tablet by mouth every four hours as needed for pain   oxyCODONE-acetaminophen (PERCOCET) 10-325 MG tablet Take 1 tablet by mouth every 4 (four) hours as needed.   pantoprazole (PROTONIX) 40 MG tablet Take 1 tablet (40 mg total) by mouth daily.   pravastatin (PRAVACHOL) 40 MG tablet Take 1 tablet (40 mg total) by mouth daily.   vitamin B-12 (CYANOCOBALAMIN) 1000 MCG tablet Take 1 tablet (1,000 mcg total) by mouth daily. (Patient taking differently: Take 1,000 mcg by mouth 3 (three) times a week.)   gabapentin (NEURONTIN) 100 MG capsule Take 1 capsule (100 mg total) by mouth 3 (three) times daily. (Patient not taking: Reported on 04/11/2022)   traZODone (DESYREL) 50 MG tablet Take 0.5-1 tablets (25-50 mg total) by mouth at bedtime as needed for sleep. (Patient not taking: Reported on 05/14/2022)   No facility-administered encounter medications on file as of 05/14/2022.    ALLERGIES: Allergies  Allergen Reactions   Atorvastatin     REACTION: more forgetful   Ibuprofen     INSTRUCTED BY MD NOT TO TAKE DUE TO GI UPSET   Morphine Sulfate Er Other (See Comments)    Chest hurt   Penicillin G Itching   Penicillins     turns blue, childhood allergy Has patient had a PCN reaction causing immediate rash, facial/tongue/throat swelling, SOB or lightheadedness with hypotension: Yes Has patient had a PCN reaction causing severe rash involving mucus membranes or skin necrosis: No Has patient had a PCN reaction that required hospitalization: Unknown  Has patient had a PCN reaction occurring within the last 10 years: No If  all of the above answers are "NO", then may proceed with Cephalosporin use.    Simvastatin     REACTION: more forgetful   Tetracycline Itching   Tetracycline Hcl Itching    VACCINATION STATUS: Immunization History  Administered Date(s) Administered   Influenza Inj Mdck Quad Pf 04/18/2018   Influenza Whole 07/23/2007, 08/02/2009   Influenza,inj,Quad PF,6+ Mos 04/17/2017, 04/20/2019, 05/05/2020, 05/05/2021   Influenza-Unspecified 05/31/2015, 05/28/2016, 05/17/2017, 04/20/2019, 05/05/2020   PFIZER(Purple Top)SARS-COV-2 Vaccination 12/03/2019, 12/26/2019, 08/10/2020   PPD Test 03/21/2013, 03/21/2014   Pfizer Covid-19 Vaccine Bivalent Booster 57yr & up 06/03/2021  Td 08/26/2003   Tdap 12/02/2013   Zoster Recombinat (Shingrix) 02/13/2021, 07/09/2021     HPI  TOSHUA HONSINGER is 56 y.o. female who presents today with a medical history as above. she is being seen in consultation for hyperthyroidism requested by Biagio Borg, MD.  She had routine labs done in June showing slightly suppressed TSH of 0.14.  she has been dealing with symptoms of hair loss, weight gain for about a year. These symptoms are progressively worsening and troubling to her.    she denies dysphagia, choking, shortness of breath, no recent voice change.    she does family history of thyroid dysfunction in her mom (mom has scar on neck but is unsure of specific etiology), and denies family hx of thyroid cancer. she denies personal history of goiter. she is not on any anti-thyroid medications nor on any thyroid hormone supplements. Denies use of Biotin containing supplements other than MVI.  she is willing to proceed with appropriate work up and therapy for thyrotoxicosis.   Review of systems  Constitutional: + steadily increasing body weight-per patient report, current Body mass index is 36.71 kg/m., no fatigue, no subjective hyperthermia, no subjective hypothermia Eyes: no blurry vision, no xerophthalmia ENT: no  sore throat, no nodules palpated in throat, no dysphagia/odynophagia, no hoarseness Cardiovascular: no chest pain, no shortness of breath, no palpitations, no leg swelling Respiratory: no cough, no shortness of breath Gastrointestinal: no nausea/vomiting/diarrhea Musculoskeletal: no muscle/joint aches Skin: no rashes, no hyperemia, + hair loss Neurological: no tremors, no numbness, no tingling, no dizziness Psychiatric: no depression, no anxiety   Objective:    BP 115/79 (BP Location: Left Arm, Patient Position: Sitting, Cuff Size: Large)   Pulse 68   Ht _0  (1.651 m)   Wt 220 lb 9.6 oz (100.1 kg)   BMI 36.71 kg/m   Wt Readings from Last 3 Encounters:  05/14/22 220 lb 9.6 oz (100.1 kg)  04/17/22 219 lb (99.3 kg)  02/19/22 219 lb 12.8 oz (99.7 kg)     BP Readings from Last 3 Encounters:  05/14/22 115/79  04/17/22 114/69  02/19/22 112/70                          Physical Exam- Limited  Constitutional:  Body mass index is 36.71 kg/m. , not in acute distress, normal state of mind Eyes:  EOMI, no exophthalmos Neck: Supple Thyroid: No gross goiter Cardiovascular: RRR, no murmurs, rubs, or gallops, no edema Respiratory: Adequate breathing efforts, no crackles, rales, rhonchi, or wheezing Musculoskeletal: no gross deformities, strength intact in all four extremities, no gross restriction of joint movements Skin:  no rashes, no hyperemia Neurological: no tremor with outstretched hands, DTR normal in BLE   CMP     Component Value Date/Time   NA 140 02/19/2022 0902   K 4.0 02/19/2022 0902   CL 104 02/19/2022 0902   CO2 28 02/19/2022 0902   GLUCOSE 97 02/19/2022 0902   GLUCOSE 97 06/11/2006 0838   BUN 12 02/19/2022 0902   CREATININE 0.84 02/19/2022 0902   CREATININE 0.83 02/12/2012 1951   CALCIUM 9.9 02/19/2022 0902   PROT 7.3 02/19/2022 0902   ALBUMIN 4.3 02/19/2022 0902   AST 24 02/19/2022 0902   ALT 31 02/19/2022 0902   ALKPHOS 57 02/19/2022 0902   BILITOT 0.5  02/19/2022 0902   GFRNONAA >60 10/22/2020 1938   GFRAA >60 11/02/2017 0915     CBC  Component Value Date/Time   WBC 7.0 02/19/2022 0902   RBC 5.05 02/19/2022 0902   HGB 15.0 02/19/2022 0902   HCT 45.2 02/19/2022 0902   PLT 247.0 02/19/2022 0902   MCV 89.6 02/19/2022 0902   MCV 89.3 02/12/2012 1951   MCH 29.2 10/22/2020 1938   MCHC 33.1 02/19/2022 0902   RDW 12.9 02/19/2022 0902   LYMPHSABS 2.7 02/19/2022 0902   MONOABS 0.8 02/19/2022 0902   EOSABS 0.2 02/19/2022 0902   BASOSABS 0.1 02/19/2022 0902     Diabetic Labs (most recent): Lab Results  Component Value Date   HGBA1C 5.8 02/19/2022   HGBA1C 5.7 08/15/2021   HGBA1C 5.7 02/13/2021   MICROALBUR <0.7 08/10/2019    Lipid Panel     Component Value Date/Time   CHOL 144 02/19/2022 0902   TRIG 98.0 02/19/2022 0902   TRIG 84 07/31/2006 0824   HDL 44.90 02/19/2022 0902   CHOLHDL 3 02/19/2022 0902   VLDL 19.6 02/19/2022 0902   LDLCALC 80 02/19/2022 0902   LDLDIRECT 128.8 07/18/2008 0825     Lab Results  Component Value Date   TSH 0.14 (L) 02/19/2022   TSH 2.04 02/13/2021   TSH 1.58 02/08/2020   TSH 1.92 08/10/2019   TSH 1.81 02/08/2019   TSH 2.95 01/26/2018   TSH 1.56 01/09/2017   TSH 2.38 01/06/2017   TSH 2.77 12/28/2015   TSH 1.39 07/03/2015        Assessment & Plan:   1. Abnormal TSH  she is being seen at a kind request of Biagio Borg, MD.  her history and most recent labs are reviewed, and she was examined clinically. Subjective and objective findings are inconsistent with thyrotoxicosis from primary hyperthyroidism. The potential risks of untreated thyrotoxicosis and the need for definitive therapy have been discussed in detail with her, and she agrees to proceed with diagnostic workup and treatment plan.   I will repeat full profile thyroid function tests today (including antibody testing to assess for autoimmune thyroid dysfunction).  Will hold off on any imaging studies at this time.    she will return in 1 week to discuss lab results and treatment plan.   I did not initiate any new prescriptions today.    -Patient is advised to maintain close follow up with Biagio Borg, MD for primary care needs.   - Time spent with the patient: 60 minutes, of which >50% was spent in obtaining information about her symptoms, reviewing her previous labs, evaluations, and treatments, counseling her about her hyperthyroidism , and developing a plan to confirm the diagnosis and long term treatment as necessary. Please refer to "Patient Self Inventory" in the Media tab for reviewed elements of pertinent patient history.  Pamela Gibson participated in the discussions, expressed understanding, and voiced agreement with the above plans.  All questions were answered to her satisfaction. she is encouraged to contact clinic should she have any questions or concerns prior to her return visit.   Follow up plan: Return in about 1 week (around 05/21/2022) for Thyroid follow up, Previsit labs.   Thank you for involving me in the care of this pleasant patient, and I will continue to update you with her progress.    Rayetta Pigg, [facility] [address] [place], [place] [address] Phone: [phone] Fax: [phone]  05/14/2022, 9:34 AM

## 2022-05-14 NOTE — Patient Instructions (Signed)
Hyperthyroidism Hyperthyroidism refers to a thyroid gland that is too active or overactive. The thyroid gland is a small gland located in the lower front part of the neck, just in front of the windpipe (trachea). This gland makes hormones that: Help control how the body uses food for energy (metabolism). Help the heart and brain work well. Keep your bones strong. When the thyroid is overactive, it produces too much of a hormone called thyroxine. What are the causes? This condition may be caused by: Graves' disease. This is a disorder in which the body's disease-fighting system (immune system) attacks the thyroid gland. This is the most common cause. Inflammation of the thyroid gland. A tumor in the thyroid gland. Use of certain medicines, including: Prescription thyroid hormone replacement. Herbal supplements that mimic thyroid hormones. Amiodarone therapy. Solid or fluid-filled lumps within your thyroid gland (thyroid nodules). Taking in a large amount of iodine from foods or medicines. What increases the risk? You are more likely to develop this condition if: You are female. You have a family history of thyroid conditions. You smoke tobacco. You use a medicine called lithium. You take medicines that affect the immune system (immunosuppressants). What are the signs or symptoms? Symptoms of this condition include: Nervousness. Inability to tolerate heat. Diarrhea. Rapid heart rate. Shaky hands. Restlessness. Sleep problems. Other symptoms may include: Heart skipping beats or making extra beats. Unexplained weight loss. Change in the texture of hair or skin. Loss of menstruation. Fatigue. Enlarged thyroid gland or a lump in the thyroid (nodule). You may also have symptoms of Graves' disease, which may include: Protruding eyes. Dry eyes. Red or swollen eyes. Problems with vision. How is this diagnosed? This condition may be diagnosed based on: Your symptoms and medical  history. A physical exam. Blood tests. Thyroid ultrasound. This test involves using sound waves to produce images of the thyroid gland. A thyroid scan. A radioactive substance is injected into a vein, and images show how much iodine is present in the thyroid. Radioactive iodine uptake test (RAIU). A small amount of radioactive iodine is given by mouth to see how much iodine the thyroid absorbs after a certain amount of time. How is this treated? Treatment depends on the cause and severity of the condition. Treatment may include: Medicines to reduce the amount of thyroid hormone your body makes. Medicines to help manage your symptoms. Radioactive iodine treatment (radioiodine therapy). This involves swallowing a small dose of radioactive iodine, in capsule or liquid form, to kill thyroid cells. Surgery to remove part or all of your thyroid gland. You may need to take thyroid hormone replacement medicine for the rest of your life after thyroid surgery. Follow these instructions at home:  Take over-the-counter and prescription medicines only as told by your health care provider. Do not use any products that contain nicotine or tobacco. These products include cigarettes, chewing tobacco, and vaping devices, such as e-cigarettes. If you need help quitting, ask your health care provider. Follow any instructions from your health care provider about diet. You may be instructed to limit foods that contain iodine. Keep all follow-up visits. You will need to have blood tests regularly so that your health care provider can monitor your condition. Where to find more information National Institute of Diabetes and Digestive and Kidney Diseases: niddk.nih.gov Contact a health care provider if: Your symptoms do not get better with treatment. You have a fever. You have abdominal pain. You feel dizzy. You are taking thyroid hormone replacement medicine and: You have   symptoms of depression. You feel like you  are tired all the time. You gain weight. Get help right away if: You have sudden, unexplained confusion or other mental changes. You have chest pain. You have fast or irregular heartbeats (palpitations). You have difficulty breathing. These symptoms may be an emergency. Get help right away. Call 911. Do not wait to see if the symptoms will go away. Do not drive yourself to the hospital. Summary The thyroid gland is a small gland located in the lower front part of the neck, just in front of the windpipe. Hyperthyroidism is when the thyroid gland is too active and produces too much of a hormone called thyroxine. The most common cause is Graves' disease, a disorder in which your immune system attacks the thyroid gland. Hyperthyroidism can cause various symptoms, such as unexplained weight loss, nervousness, inability to tolerate heat, or changes in your heartbeat. Treatment may include medicine to reduce the amount of thyroid hormone your body makes, radioiodine therapy, surgery, or medicines to manage symptoms. This information is not intended to replace advice given to you by your health care provider. Make sure you discuss any questions you have with your health care provider. Document Revised: 10/04/2021 Document Reviewed: 10/04/2021 Elsevier Patient Education  2023 Elsevier Inc.  

## 2022-05-15 ENCOUNTER — Other Ambulatory Visit (HOSPITAL_COMMUNITY): Payer: Self-pay

## 2022-05-15 LAB — THYROGLOBULIN ANTIBODY: Thyroglobulin Antibody: <1 IU/mL (ref 0.0–0.9)

## 2022-05-15 LAB — TSH: TSH: 1.94 u[IU]/mL (ref 0.450–4.500)

## 2022-05-15 LAB — T4, FREE: Free T4: 1.28 ng/dL (ref 0.82–1.77)

## 2022-05-15 LAB — T3, FREE: T3, Free: 3.2 pg/mL (ref 2.0–4.4)

## 2022-05-15 LAB — THYROID PEROXIDASE ANTIBODY: Thyroperoxidase Ab SerPl-aCnc: 12 IU/mL (ref 0–34)

## 2022-05-15 MED ORDER — INFLUENZA VAC SPLIT QUAD 0.5 ML IM SUSY
0.5000 mL | PREFILLED_SYRINGE | Freq: Once | INTRAMUSCULAR | 0 refills | Status: AC
  Filled 2022-05-15: qty 0.5, 1d supply, fill #0

## 2022-05-22 ENCOUNTER — Encounter: Payer: Self-pay | Admitting: Nurse Practitioner

## 2022-05-22 ENCOUNTER — Ambulatory Visit (INDEPENDENT_AMBULATORY_CARE_PROVIDER_SITE_OTHER): Payer: Commercial Managed Care - HMO | Admitting: Nurse Practitioner

## 2022-05-22 VITALS — BP 134/82 | HR 66 | Ht 65.0 in | Wt 222.2 lb

## 2022-05-22 DIAGNOSIS — R7989 Other specified abnormal findings of blood chemistry: Secondary | ICD-10-CM | POA: Diagnosis not present

## 2022-05-22 NOTE — Progress Notes (Signed)
05/22/2022     Endocrinology Follow Up Note    Subjective:    Patient ID: Pamela Gibson, female    DOB: [birth year], PCP Biagio Borg, MD.   Past Medical History:  Diagnosis Date   Anxiety    BACK PAIN 07/23/2007   Barrett's esophagus    CHEST PAIN 09/04/2010   Colon adenoma    Cyst of right ovary 07/03/2015   DEGENERATIVE JOINT DISEASE, LUMBAR SPINE 07/23/2007   Depression    Gastric polyp    x3   GERD 07/22/2007   H/O total hysterectomy    HYPERLIPIDEMIA 07/22/2007   IBS 07/23/2007   MITRAL VALVE PROLAPSE 07/22/2007   Pinched nerve    LEFT SIDE, DISC 3,4,5   Pneumonia    YEARS AGO   SINUSITIS- ACUTE-NOS 09/09/2007   TACHYCARDIA 08/20/2010   Tuberculosis     MOTHER HAD YEARS AGO   Unspecified Vaginitis and Vulvovaginitis 12/17/2007    Past Surgical History:  Procedure Laterality Date   ABDOMINAL HYSTERECTOMY     BIOPSY  04/17/2022   Procedure: BIOPSY;  Surgeon: Sharyn Creamer, MD;  Location: [facility];  Service: Gastroenterology;;   CHOLECYSTECTOMY     COLONOSCOPY WITH PROPOFOL N/A 04/17/2022   Procedure: COLONOSCOPY WITH PROPOFOL;  Surgeon: Sharyn Creamer, MD;  Location: [facility];  Service: Gastroenterology;  Laterality: N/A;   CYSTOSCOPY  11/11/2017   Procedure: CYSTOSCOPY;  Surgeon: Janyth Pupa, DO;  Location: [facility];  Service: Gynecology;;   ESOPHAGOGASTRODUODENOSCOPY (EGD) WITH PROPOFOL N/A 04/17/2022   Procedure: ESOPHAGOGASTRODUODENOSCOPY (EGD) WITH PROPOFOL;  Surgeon: Sharyn Creamer, MD;  Location: [facility];  Service: Gastroenterology;  Laterality: N/A;   LAPAROSCOPIC BILATERAL SALPINGO OOPHERECTOMY Bilateral 11/11/2017   Procedure: LAPAROSCOPIC BILATERAL SALPINGO OOPHORECTOMY;  Surgeon: Janyth Pupa, DO;  Location: [facility];  Service: Gynecology;  Laterality: Bilateral;   POLYPECTOMY  04/17/2022   Procedure: POLYPECTOMY;  Surgeon: Sharyn Creamer, MD;  Location: [facility];  Service: Gastroenterology;;   RLE tib/fib fx/surgury      TONSILLECTOMY     TUBAL LIGATION      Social History   Socioeconomic History   Marital status: Married    Spouse name: Not on file   Number of children: 3   Years of education: Not on file   Highest education level: Not on file  Occupational History   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    Employer: [employer]  Tobacco Use   Smoking status: Never   Smokeless tobacco: Never  Vaping Use   Vaping Use: Never used  Substance and Sexual Activity   Alcohol use: Yes    Comment: occasional   Drug use: No   Sexual activity: Never  Other Topics Concern   Not on file  Social History Narrative   Not on file   Social Determinants of Health   Financial Resource Strain: Not on file  Food Insecurity: Not on file  Transportation Needs: Not on file  Physical Activity: Not on file  Stress: Not on file  Social Connections: Not on file    Family History  Problem Relation Age of Onset   Colon polyps Mother    Angina Mother    Heart disease Mother        CVA   Coronary artery disease Mother    Stroke Mother    Hypertension Other    Diabetes Other    Hyperlipidemia Other    Breast cancer Neg Hx  Esophageal cancer Neg Hx    Colon cancer Neg Hx    Pancreatic cancer Neg Hx    Stomach cancer Neg Hx    Liver disease Neg Hx     Outpatient Encounter Medications as of 05/22/2022  Medication Sig   ALPRAZolam (XANAX) 1 MG tablet Take 1 tablet by mouth three times daily as needed   aspirin 81 MG EC tablet Take 81 mg by mouth every other day.   calcium carbonate (TUMS - DOSED IN MG ELEMENTAL CALCIUM) 500 MG chewable tablet Chew 1-2 tablets by mouth daily as needed for indigestion or heartburn.   Cholecalciferol (VITAMIN D3) 25 MCG (1000 UT) CAPS Take 2,000 Units by mouth daily.   [employer] PURELAX 17 GM/SCOOP powder Take 0.5 Containers by mouth daily as needed for mild constipation or moderate constipation.   lidocaine (LIDODERM) 5 % Place 1-3 patches onto skin as directed.  (Patient taking differently: Place 1 patch onto the skin daily as needed (pain).)   losartan (COZAAR) 25 MG tablet Take 1 tablet (25 mg total) by mouth daily.   Multiple Vitamins-Minerals (MULTIVITAMIN WITH MINERALS) tablet Take 1 tablet by mouth daily.   NARCAN 4 MG/0.1ML LIQD nasal spray kit Place 1 spray into the nose once.   oxyCODONE-acetaminophen (PERCOCET) 10-325 MG tablet Take 1 tablet by mouth every four hours as needed for pain   oxyCODONE-acetaminophen (PERCOCET) 10-325 MG tablet Take 1 tablet by mouth every 4 (four) hours as needed.   pantoprazole (PROTONIX) 40 MG tablet Take 1 tablet (40 mg total) by mouth daily.   pravastatin (PRAVACHOL) 40 MG tablet Take 1 tablet (40 mg total) by mouth daily.   vitamin B-12 (CYANOCOBALAMIN) 1000 MCG tablet Take 1 tablet (1,000 mcg total) by mouth daily. (Patient taking differently: Take 1,000 mcg by mouth 3 (three) times a week.)   gabapentin (NEURONTIN) 100 MG capsule Take 1 capsule (100 mg total) by mouth 3 (three) times daily. (Patient not taking: Reported on 04/11/2022)   traZODone (DESYREL) 50 MG tablet Take 0.5-1 tablets (25-50 mg total) by mouth at bedtime as needed for sleep. (Patient not taking: Reported on 05/14/2022)   No facility-administered encounter medications on file as of 05/22/2022.    ALLERGIES: Allergies  Allergen Reactions   Atorvastatin     REACTION: more forgetful   Ibuprofen     INSTRUCTED BY MD NOT TO TAKE DUE TO GI UPSET   Morphine Sulfate Er Other (See Comments)    Chest hurt   Penicillin G Itching   Penicillins     turns blue, childhood allergy Has patient had a PCN reaction causing immediate rash, facial/tongue/throat swelling, SOB or lightheadedness with hypotension: Yes Has patient had a PCN reaction causing severe rash involving mucus membranes or skin necrosis: No Has patient had a PCN reaction that required hospitalization: Unknown  Has patient had a PCN reaction occurring within the last 10 years: No If  all of the above answers are "NO", then may proceed with Cephalosporin use.    Simvastatin     REACTION: more forgetful   Tetracycline Itching   Tetracycline Hcl Itching    VACCINATION STATUS: Immunization History  Administered Date(s) Administered   Influenza Inj Mdck Quad Pf 04/18/2018   Influenza Whole 07/23/2007, 08/02/2009   Influenza,inj,Quad PF,6+ Mos 04/17/2017, 04/20/2019, 05/05/2020, 05/05/2021   Influenza-Unspecified 05/31/2015, 05/28/2016, 05/17/2017, 04/20/2019, 05/05/2020, 05/15/2022   PFIZER(Purple Top)SARS-COV-2 Vaccination 12/03/2019, 12/26/2019, 08/10/2020   PPD Test 03/21/2013, 03/21/2014   Pfizer Covid-19 Vaccine Bivalent Booster 84yrs & up 06/03/2021  Td 08/26/2003   Tdap 12/02/2013   Zoster Recombinat (Shingrix) 02/13/2021, 07/09/2021     HPI  Pamela Gibson is 56 y.o. female who presents today with a medical history as above. she is being seen in follow up after being seen in consultation for hyperthyroidism/abnormal TSH requested by Biagio Borg, MD.  She had routine labs done in June showing slightly suppressed TSH of 0.14.  she has been dealing with symptoms of hair loss, weight gain for about a year.  she denies dysphagia, choking, shortness of breath, no recent voice change.    she does family history of thyroid dysfunction in her mom (mom has scar on neck but is unsure of specific etiology), and denies family hx of thyroid cancer. she denies personal history of goiter. she is not on any anti-thyroid medications nor on any thyroid hormone supplements. Denies use of Biotin containing supplements other than MVI.  she underwent appropriate work up for thyrotoxicosis with more comprehensive thyroid blood work showing euthyroid presentation.   Review of systems  Constitutional: + steadily increasing body weight-per patient report, current Body mass index is 36.98 kg/m., no fatigue, no subjective hyperthermia, no subjective hypothermia Eyes: no blurry  vision, no xerophthalmia ENT: no sore throat, no nodules palpated in throat, no dysphagia/odynophagia, no hoarseness Cardiovascular: no chest pain, no shortness of breath, no palpitations, no leg swelling Respiratory: no cough, no shortness of breath Gastrointestinal: no nausea/vomiting/diarrhea Musculoskeletal: no muscle/joint aches Skin: no rashes, no hyperemia, + hair loss Neurological: no tremors, no numbness, no tingling, no dizziness Psychiatric: no depression, no anxiety   Objective:    BP 134/82 (BP Location: Left Arm, Patient Position: Sitting, Cuff Size: Large)   Pulse 66   Ht _0  (1.651 m)   Wt 222 lb 3.2 oz (100.8 kg)   BMI 36.98 kg/m   Wt Readings from Last 3 Encounters:  05/22/22 222 lb 3.2 oz (100.8 kg)  05/14/22 220 lb 9.6 oz (100.1 kg)  04/17/22 219 lb (99.3 kg)     BP Readings from Last 3 Encounters:  05/22/22 134/82  05/14/22 115/79  04/17/22 114/69                 Physical Exam- Limited  Constitutional:  Body mass index is 36.98 kg/m. , not in acute distress, normal state of mind Eyes:  EOMI, no exophthalmos Neck: Supple Cardiovascular: RRR, no murmurs, rubs, or gallops, no edema Respiratory: Adequate breathing efforts, no crackles, rales, rhonchi, or wheezing Musculoskeletal: no gross deformities, strength intact in all four extremities, no gross restriction of joint movements Skin:  no rashes, no hyperemia Neurological: no tremor with outstretched hands   CMP     Component Value Date/Time   NA 140 02/19/2022 0902   K 4.0 02/19/2022 0902   CL 104 02/19/2022 0902   CO2 28 02/19/2022 0902   GLUCOSE 97 02/19/2022 0902   GLUCOSE 97 06/11/2006 0838   BUN 12 02/19/2022 0902   CREATININE 0.84 02/19/2022 0902   CREATININE 0.83 02/12/2012 1951   CALCIUM 9.9 02/19/2022 0902   PROT 7.3 02/19/2022 0902   ALBUMIN 4.3 02/19/2022 0902   AST 24 02/19/2022 0902   ALT 31 02/19/2022 0902   ALKPHOS 57 02/19/2022 0902   BILITOT 0.5 02/19/2022 0902    GFRNONAA >60 10/22/2020 1938   GFRAA >60 11/02/2017 0915     CBC    Component Value Date/Time   WBC 7.0 02/19/2022 0902   RBC 5.05 02/19/2022 0902   HGB  15.0 02/19/2022 0902   HCT 45.2 02/19/2022 0902   PLT 247.0 02/19/2022 0902   MCV 89.6 02/19/2022 0902   MCV 89.3 02/12/2012 1951   MCH 29.2 10/22/2020 1938   MCHC 33.1 02/19/2022 0902   RDW 12.9 02/19/2022 0902   LYMPHSABS 2.7 02/19/2022 0902   MONOABS 0.8 02/19/2022 0902   EOSABS 0.2 02/19/2022 0902   BASOSABS 0.1 02/19/2022 0902     Diabetic Labs (most recent): Lab Results  Component Value Date   HGBA1C 5.8 02/19/2022   HGBA1C 5.7 08/15/2021   HGBA1C 5.7 02/13/2021   MICROALBUR <0.7 08/10/2019    Lipid Panel     Component Value Date/Time   CHOL 144 02/19/2022 0902   TRIG 98.0 02/19/2022 0902   TRIG 84 07/31/2006 0824   HDL 44.90 02/19/2022 0902   CHOLHDL 3 02/19/2022 0902   VLDL 19.6 02/19/2022 0902   LDLCALC 80 02/19/2022 0902   LDLDIRECT 128.8 07/18/2008 0825     Lab Results  Component Value Date   TSH 1.940 05/14/2022   TSH 0.14 (L) 02/19/2022   TSH 2.04 02/13/2021   TSH 1.58 02/08/2020   TSH 1.92 08/10/2019   TSH 1.81 02/08/2019   TSH 2.95 01/26/2018   TSH 1.56 01/09/2017   TSH 2.38 01/06/2017   TSH 2.77 12/28/2015   FREET4 1.28 05/14/2022      Latest Reference Range & Units 08/10/19 09:21 02/08/20 09:28 02/13/21 09:10 02/19/22 09:02 05/14/22 09:42  TSH 0.450 - 4.500 uIU/mL 1.92 1.58 2.04 0.14 (L) 1.940  Triiodothyronine,Free,Serum 2.0 - 4.4 pg/mL     3.2  T4,Free(Direct) 0.82 - 1.77 ng/dL     1.28  Thyroperoxidase Ab SerPl-aCnc 0 - 34 IU/mL     12  Thyroglobulin Antibody 0.0 - 0.9 IU/mL     <1.0  (L): Data is abnormally low   Assessment & Plan:   1. Abnormal TSH- resolved  she is being seen at a kind request of Biagio Borg, MD.  Her repeat thyroid function tests show euthyroid presentation without intervention.  The absence of thyroid antibodies rules out autoimmune thyroid  dysfunction.  She could have had acute inflammation of her thyroid which caused her TSH to be suppressed temporarily and resolved without intervention.  She will not need further testing at this time.  Will recommend she has annual thyroid labs for surveillance moving forward, which she can either have done here, or with her PCP.    -Patient is advised to maintain close follow up with Biagio Borg, MD for primary care needs.   I spent 15 minutes in the care of the patient today including review of labs from Thyroid Function, CMP, and other relevant labs ; imaging/biopsy records (current and previous including abstractions from other facilities); face-to-face time discussing  her lab results and symptoms, medications doses, her options of short and long term treatment based on the latest standards of care / guidelines;   and documenting the encounter.  Pamela Gibson  participated in the discussions, expressed understanding, and voiced agreement with the above plans.  All questions were answered to her satisfaction. she is encouraged to contact clinic should she have any questions or concerns prior to her return visit.   Follow up plan: Return if symptoms worsen or fail to improve.   Thank you for involving me in the care of this pleasant patient, and I will continue to update you with her progress.    Rayetta Pigg, FNP-BC [facility] [address]  [place], [place] [address] Phone: [phone] Fax: [phone]  05/22/2022, 8:22 AM

## 2022-06-03 ENCOUNTER — Other Ambulatory Visit (HOSPITAL_COMMUNITY): Payer: Self-pay

## 2022-06-03 MED ORDER — OXYCODONE-ACETAMINOPHEN 10-325 MG PO TABS
1.0000 | ORAL_TABLET | ORAL | 0 refills | Status: DC | PRN
  Filled 2022-06-03: qty 180, 30d supply, fill #0

## 2022-06-12 ENCOUNTER — Encounter (INDEPENDENT_AMBULATORY_CARE_PROVIDER_SITE_OTHER): Payer: Commercial Managed Care - HMO | Admitting: Ophthalmology

## 2022-06-12 DIAGNOSIS — H43813 Vitreous degeneration, bilateral: Secondary | ICD-10-CM

## 2022-06-12 DIAGNOSIS — H34811 Central retinal vein occlusion, right eye, with macular edema: Secondary | ICD-10-CM

## 2022-06-12 DIAGNOSIS — I10 Essential (primary) hypertension: Secondary | ICD-10-CM

## 2022-06-12 DIAGNOSIS — H35371 Puckering of macula, right eye: Secondary | ICD-10-CM | POA: Diagnosis not present

## 2022-06-12 DIAGNOSIS — H35033 Hypertensive retinopathy, bilateral: Secondary | ICD-10-CM | POA: Diagnosis not present

## 2022-06-16 ENCOUNTER — Encounter (INDEPENDENT_AMBULATORY_CARE_PROVIDER_SITE_OTHER): Payer: Commercial Managed Care - HMO | Admitting: Ophthalmology

## 2022-06-16 DIAGNOSIS — H34811 Central retinal vein occlusion, right eye, with macular edema: Secondary | ICD-10-CM

## 2022-06-19 ENCOUNTER — Encounter (INDEPENDENT_AMBULATORY_CARE_PROVIDER_SITE_OTHER): Payer: Commercial Managed Care - HMO | Admitting: Ophthalmology

## 2022-07-01 ENCOUNTER — Other Ambulatory Visit (HOSPITAL_COMMUNITY): Payer: Self-pay

## 2022-07-01 MED ORDER — OXYCODONE-ACETAMINOPHEN 10-325 MG PO TABS
1.0000 | ORAL_TABLET | ORAL | 0 refills | Status: DC | PRN
  Filled 2022-07-01: qty 180, 30d supply, fill #0

## 2022-07-04 ENCOUNTER — Other Ambulatory Visit: Payer: Self-pay | Admitting: Internal Medicine

## 2022-07-14 ENCOUNTER — Ambulatory Visit (INDEPENDENT_AMBULATORY_CARE_PROVIDER_SITE_OTHER): Payer: Commercial Managed Care - HMO | Admitting: Internal Medicine

## 2022-07-14 ENCOUNTER — Other Ambulatory Visit (HOSPITAL_COMMUNITY): Payer: Self-pay

## 2022-07-14 ENCOUNTER — Encounter: Payer: Self-pay | Admitting: Internal Medicine

## 2022-07-14 VITALS — BP 112/64 | HR 74 | Temp 98.2°F | Ht 65.0 in | Wt 223.0 lb

## 2022-07-14 DIAGNOSIS — J069 Acute upper respiratory infection, unspecified: Secondary | ICD-10-CM | POA: Diagnosis not present

## 2022-07-14 MED ORDER — TRIAMCINOLONE ACETONIDE 0.1 % EX CREA
1.0000 | TOPICAL_CREAM | Freq: Two times a day (BID) | CUTANEOUS | 0 refills | Status: DC
  Filled 2022-07-14: qty 80, 30d supply, fill #0
  Filled 2022-08-07: qty 20, 10d supply, fill #1

## 2022-07-14 MED ORDER — BENZONATATE 200 MG PO CAPS
200.0000 mg | ORAL_CAPSULE | Freq: Three times a day (TID) | ORAL | 0 refills | Status: DC | PRN
  Filled 2022-07-14: qty 60, 20d supply, fill #0

## 2022-07-14 MED ORDER — PROMETHAZINE-DM 6.25-15 MG/5ML PO SYRP
5.0000 mL | ORAL_SOLUTION | Freq: Four times a day (QID) | ORAL | 0 refills | Status: DC | PRN
  Filled 2022-07-14: qty 118, 6d supply, fill #0

## 2022-07-14 NOTE — Assessment & Plan Note (Signed)
Likely RSV given exposure and high community levels. Given supportive care information. Rx tessalon perles 200 mg TID prn and promethazine/dm qhs prn. Lungs clear on exam.

## 2022-07-14 NOTE — Patient Instructions (Signed)
We have sent in the cream for the arm to use twice a day. We have sent in cough medicine promethazine/dm to use at night time. We have sent in tessalon perles to use 3 times a day as needed.

## 2022-07-14 NOTE — Progress Notes (Signed)
   Subjective:   Patient ID: Pamela Gibson, female    DOB: 1965-09-20, 56 y.o.   MRN: [id number]  HPI The patient is a 56 YO female coming in for acute visit, exposed to RSV.  Review of Systems  Constitutional:  Positive for activity change. Negative for appetite change, chills, fatigue, fever and unexpected weight change.  HENT:  Positive for congestion, postnasal drip, rhinorrhea and sinus pressure. Negative for ear discharge, ear pain, sinus pain, sneezing, sore throat, tinnitus, trouble swallowing and voice change.   Eyes: Negative.   Respiratory:  Positive for cough. Negative for chest tightness, shortness of breath and wheezing.   Cardiovascular: Negative.   Gastrointestinal: Negative.   Musculoskeletal:  Positive for myalgias.  Neurological: Negative.     Objective:  Physical Exam Constitutional:      Appearance: She is well-developed.  HENT:     Head: Normocephalic and atraumatic.     Comments: Oropharynx with redness and clear drainage, nose with swollen turbinates, TMs normal bilaterally.  Neck:     Thyroid: No thyromegaly.  Cardiovascular:     Rate and Rhythm: Normal rate and regular rhythm.  Pulmonary:     [place]: Pulmonary effort is normal. No respiratory distress.     Breath sounds: Normal breath sounds. No wheezing or rales.  Abdominal:     Palpations: Abdomen is soft.  Musculoskeletal:        General: Tenderness present.     Cervical back: Normal range of motion.  Lymphadenopathy:     Cervical: No cervical adenopathy.  Skin:    General: Skin is warm and dry.  Neurological:     Mental Status: She is alert and oriented to person, place, and time.     Vitals:   07/14/22 1457  BP: 112/64  Pulse: 74  Temp: 98.2 F (36.8 C)  TempSrc: Oral  SpO2: 96%  Weight: 223 lb (101.2 kg)  Height: '5\' 5"'$  (1.651 m)    Assessment & Plan:

## 2022-07-15 ENCOUNTER — Other Ambulatory Visit (HOSPITAL_COMMUNITY): Payer: Self-pay

## 2022-07-16 ENCOUNTER — Ambulatory Visit: Payer: Commercial Managed Care - HMO | Admitting: Internal Medicine

## 2022-07-23 ENCOUNTER — Telehealth: Payer: Self-pay

## 2022-07-23 NOTE — Telephone Encounter (Signed)
Pt states she is experiencing cough, congestion, post-nasal drip and sore throat for the past few days. Pt wanted to be seen in office however, there are no appointments avail until Monday 07/28/2022. Pt asked is there anything she can take OTC?  **Pt was advised she may have to go to UC for above sxs.

## 2022-07-23 NOTE — Telephone Encounter (Signed)
You can also take Delsym OTC for cough, and/or Mucinex (or it's generic off brand) for congestion, and tylenol as needed for pain.

## 2022-07-23 NOTE — Telephone Encounter (Signed)
Made pt. Aware of recommendations. Will call back if they are not effective.

## 2022-07-24 ENCOUNTER — Other Ambulatory Visit (HOSPITAL_COMMUNITY): Payer: Self-pay

## 2022-07-24 MED ORDER — NIRMATRELVIR/RITONAVIR (PAXLOVID)TABLET
3.0000 | ORAL_TABLET | Freq: Two times a day (BID) | ORAL | 0 refills | Status: AC
  Filled 2022-07-24: qty 30, 5d supply, fill #0

## 2022-07-24 NOTE — Telephone Encounter (Signed)
Pt states she has tested POS for COVID and is still not feeling to well and would like to be rx'd an anti-viral to help with her sxs.

## 2022-07-24 NOTE — Addendum Note (Signed)
Addended by: Biagio Borg on: 07/24/2022 08:15 AM   Modules accepted: Orders

## 2022-07-24 NOTE — Telephone Encounter (Signed)
Ok this is done,

## 2022-07-25 ENCOUNTER — Other Ambulatory Visit: Payer: Self-pay

## 2022-07-25 ENCOUNTER — Other Ambulatory Visit (HOSPITAL_COMMUNITY): Payer: Self-pay

## 2022-07-25 MED ORDER — OXYCODONE-ACETAMINOPHEN 10-325 MG PO TABS
1.0000 | ORAL_TABLET | ORAL | 0 refills | Status: DC | PRN
  Filled 2022-08-05: qty 180, 30d supply, fill #0

## 2022-07-25 MED ORDER — LIDOCAINE 5 % EX PTCH
1.0000 | MEDICATED_PATCH | CUTANEOUS | 0 refills | Status: DC
  Filled 2022-07-25: qty 270, 90d supply, fill #0

## 2022-07-26 ENCOUNTER — Other Ambulatory Visit (HOSPITAL_COMMUNITY): Payer: Self-pay

## 2022-07-28 ENCOUNTER — Other Ambulatory Visit (HOSPITAL_COMMUNITY): Payer: Self-pay

## 2022-07-29 ENCOUNTER — Other Ambulatory Visit (HOSPITAL_COMMUNITY): Payer: Self-pay

## 2022-08-01 ENCOUNTER — Other Ambulatory Visit (HOSPITAL_COMMUNITY): Payer: Self-pay

## 2022-08-04 ENCOUNTER — Other Ambulatory Visit (HOSPITAL_COMMUNITY): Payer: Self-pay

## 2022-08-05 ENCOUNTER — Other Ambulatory Visit (HOSPITAL_COMMUNITY): Payer: Self-pay

## 2022-08-07 ENCOUNTER — Other Ambulatory Visit (HOSPITAL_COMMUNITY): Payer: Self-pay

## 2022-08-07 ENCOUNTER — Other Ambulatory Visit: Payer: Self-pay | Admitting: Internal Medicine

## 2022-08-07 MED ORDER — TRIAMCINOLONE ACETONIDE 0.1 % EX CREA
1.0000 | TOPICAL_CREAM | Freq: Two times a day (BID) | CUTANEOUS | 0 refills | Status: DC
  Filled 2022-08-07: qty 100, 30d supply, fill #0

## 2022-08-21 ENCOUNTER — Encounter: Payer: Self-pay | Admitting: Internal Medicine

## 2022-08-21 ENCOUNTER — Ambulatory Visit (INDEPENDENT_AMBULATORY_CARE_PROVIDER_SITE_OTHER): Payer: Medicaid Other | Admitting: Internal Medicine

## 2022-08-21 ENCOUNTER — Other Ambulatory Visit (HOSPITAL_COMMUNITY): Payer: Self-pay

## 2022-08-21 VITALS — BP 116/80 | HR 84 | Temp 97.6°F | Ht 65.0 in | Wt 225.0 lb

## 2022-08-21 DIAGNOSIS — E7800 Pure hypercholesterolemia, unspecified: Secondary | ICD-10-CM

## 2022-08-21 DIAGNOSIS — E559 Vitamin D deficiency, unspecified: Secondary | ICD-10-CM

## 2022-08-21 DIAGNOSIS — R739 Hyperglycemia, unspecified: Secondary | ICD-10-CM

## 2022-08-21 DIAGNOSIS — I10 Essential (primary) hypertension: Secondary | ICD-10-CM | POA: Diagnosis not present

## 2022-08-21 DIAGNOSIS — E538 Deficiency of other specified B group vitamins: Secondary | ICD-10-CM | POA: Diagnosis not present

## 2022-08-21 LAB — LIPID PANEL
Cholesterol: 159 mg/dL (ref 0–200)
HDL: 53.3 mg/dL (ref >39.00)
LDL Cholesterol: 83 mg/dL (ref 0–99)
NonHDL: 105.61
Total CHOL/HDL Ratio: 3
Triglycerides: 114 mg/dL (ref 0.0–149.0)
VLDL: 22.8 mg/dL (ref 0.0–40.0)

## 2022-08-21 LAB — HEPATIC FUNCTION PANEL
ALT: 23 U/L (ref 0–35)
AST: 23 U/L (ref 0–37)
Albumin: 4.4 g/dL (ref 3.5–5.2)
Alkaline Phosphatase: 52 U/L (ref 39–117)
Bilirubin, Direct: 0.2 mg/dL (ref 0.0–0.3)
Total Bilirubin: 0.7 mg/dL (ref 0.2–1.2)
Total Protein: 7.4 g/dL (ref 6.0–8.3)

## 2022-08-21 LAB — BASIC METABOLIC PANEL
BUN: 13 mg/dL (ref 6–23)
CO2: 28 mEq/L (ref 19–32)
Calcium: 9.8 mg/dL (ref 8.4–10.5)
Chloride: 103 mEq/L (ref 96–112)
Creatinine, Ser: 0.98 mg/dL (ref 0.40–1.20)
GFR: 64.2 mL/min (ref >60.00)
Glucose, Bld: 92 mg/dL (ref 70–99)
Potassium: 4.2 mEq/L (ref 3.5–5.1)
Sodium: 138 mEq/L (ref 135–145)

## 2022-08-21 LAB — HEMOGLOBIN A1C: Hgb A1c MFr Bld: 5.7 % (ref 4.6–6.5)

## 2022-08-21 MED ORDER — PANTOPRAZOLE SODIUM 40 MG PO TBEC
40.0000 mg | DELAYED_RELEASE_TABLET | Freq: Every day | ORAL | 6 refills | Status: DC
  Filled 2022-08-21: qty 30, 30d supply, fill #0
  Filled 2022-09-25: qty 30, 30d supply, fill #1
  Filled 2022-10-20: qty 30, 30d supply, fill #2
  Filled 2022-11-24: qty 30, 30d supply, fill #3
  Filled 2022-12-22: qty 30, 30d supply, fill #4
  Filled 2023-01-20: qty 30, 30d supply, fill #5

## 2022-08-21 NOTE — Progress Notes (Signed)
Patient ID: Pamela Gibson, female   DOB: Jan 02, 1966, 56 y.o.   MRN: [id number]        Chief Complaint: follow up HTN, HLD and hyperglycemia , low vit d and B12       HPI:  Pamela Gibson is a 56 y.o. female here overall doing ok Pt denies chest pain, increased sob or doe, wheezing, orthopnea, PND, increased LE swelling, palpitations, dizziness or syncope.   Pt denies polydipsia, polyuria, or new focal neuro s/s.    Pt denies fever, wt loss, night sweats, loss of appetite, or other constitutional symptoms  S/p recent covid infection late nov 2023, still with fatigue but no cough, n/v/d.   Wt Readings from Last 3 Encounters:  08/21/22 225 lb (102.1 kg)  07/14/22 223 lb (101.2 kg)  05/22/22 222 lb 3.2 oz (100.8 kg)   BP Readings from Last 3 Encounters:  08/21/22 116/80  07/14/22 112/64  05/22/22 134/82         Past Medical History:  Diagnosis Date   Anxiety    BACK PAIN 07/23/2007   Barrett's esophagus    CHEST PAIN 09/04/2010   Colon adenoma    Cyst of right ovary 07/03/2015   DEGENERATIVE JOINT DISEASE, LUMBAR SPINE 07/23/2007   Depression    Gastric polyp    x3   GERD 07/22/2007   H/O total hysterectomy    HYPERLIPIDEMIA 07/22/2007   IBS 07/23/2007   MITRAL VALVE PROLAPSE 07/22/2007   Pinched nerve    LEFT SIDE, DISC 3,4,5   Pneumonia    YEARS AGO   SINUSITIS- ACUTE-NOS 09/09/2007   TACHYCARDIA 08/20/2010   Tuberculosis     MOTHER HAD YEARS AGO   Unspecified Vaginitis and Vulvovaginitis 12/17/2007   Past Surgical History:  Procedure Laterality Date   ABDOMINAL HYSTERECTOMY     BIOPSY  04/17/2022   Procedure: BIOPSY;  Surgeon: Sharyn Creamer, MD;  Location: [facility];  Service: Gastroenterology;;   CHOLECYSTECTOMY     COLONOSCOPY WITH PROPOFOL N/A 04/17/2022   Procedure: COLONOSCOPY WITH PROPOFOL;  Surgeon: Sharyn Creamer, MD;  Location: [facility];  Service: Gastroenterology;  Laterality: N/A;   CYSTOSCOPY  11/11/2017   Procedure: CYSTOSCOPY;  Surgeon: Janyth Pupa, DO;  Location: [facility];  Service: Gynecology;;   ESOPHAGOGASTRODUODENOSCOPY (EGD) WITH PROPOFOL N/A 04/17/2022   Procedure: ESOPHAGOGASTRODUODENOSCOPY (EGD) WITH PROPOFOL;  Surgeon: Sharyn Creamer, MD;  Location: [facility];  Service: Gastroenterology;  Laterality: N/A;   LAPAROSCOPIC BILATERAL SALPINGO OOPHERECTOMY Bilateral 11/11/2017   Procedure: LAPAROSCOPIC BILATERAL SALPINGO OOPHORECTOMY;  Surgeon: Janyth Pupa, DO;  Location: [facility];  Service: Gynecology;  Laterality: Bilateral;   POLYPECTOMY  04/17/2022   Procedure: POLYPECTOMY;  Surgeon: Sharyn Creamer, MD;  Location: [facility];  Service: Gastroenterology;;   RLE tib/fib fx/surgury     TONSILLECTOMY     TUBAL LIGATION      reports that she has never smoked. She has never used smokeless tobacco. She reports current alcohol use. She reports that she does not use drugs. family history includes Angina in her mother; Colon polyps in her mother; Coronary artery disease in her mother; Diabetes in an other family member; Heart disease in her mother; Hyperlipidemia in an other family member; Hypertension in an other family member; Stroke in her mother. Allergies  Allergen Reactions   Atorvastatin     REACTION: more forgetful   Ibuprofen     INSTRUCTED BY MD NOT TO TAKE DUE TO GI UPSET   Morphine Sulfate  Er Other (See Comments)    Chest hurt   Penicillin G Itching   Penicillins     turns blue, childhood allergy Has patient had a PCN reaction causing immediate rash, facial/tongue/throat swelling, SOB or lightheadedness with hypotension: Yes Has patient had a PCN reaction causing severe rash involving mucus membranes or skin necrosis: No Has patient had a PCN reaction that required hospitalization: Unknown  Has patient had a PCN reaction occurring within the last 10 years: No If all of the above answers are "NO", then may proceed with Cephalosporin use.    Simvastatin     REACTION: more forgetful   Tetracycline Itching    Tetracycline Hcl Itching   Current Outpatient Medications on File Prior to Visit  Medication Sig Dispense Refill   ALPRAZolam (XANAX) 1 MG tablet Take 1 tablet by mouth three times daily as needed 90 tablet 2   aspirin 81 MG EC tablet Take 81 mg by mouth every other day.     benzonatate (TESSALON) 200 MG capsule Take 1 capsule (200 mg total) by mouth 3 (three) times daily as needed. 60 capsule 0   calcium carbonate (TUMS - DOSED IN MG ELEMENTAL CALCIUM) 500 MG chewable tablet Chew 1-2 tablets by mouth daily as needed for indigestion or heartburn.     Cholecalciferol (VITAMIN D3) 25 MCG (1000 UT) CAPS Take 2,000 Units by mouth daily.     [employer] PURELAX 17 GM/SCOOP powder Take 0.5 Containers by mouth daily as needed for mild constipation or moderate constipation.     gabapentin (NEURONTIN) 100 MG capsule Take 1 capsule (100 mg total) by mouth 3 (three) times daily. 270 capsule 1   lidocaine (LIDODERM) 5 % Place 1-3 patches onto skin as directed. (Patient taking differently: Place 1 patch onto the skin daily as needed (pain).) 270 patch 0   lidocaine (LIDODERM) 5 % Place 1 - 3 patches onto the skin as directed. Patches may be worn for up to 12 hours and must be off for 12 hours between uses. 270 patch 0   losartan (COZAAR) 25 MG tablet Take 1 tablet (25 mg total) by mouth daily. 90 tablet 3   Multiple Vitamins-Minerals (MULTIVITAMIN WITH MINERALS) tablet Take 1 tablet by mouth daily.     NARCAN 4 MG/0.1ML LIQD nasal spray kit Place 1 spray into the nose once.     oxyCODONE-acetaminophen (PERCOCET) 10-325 MG tablet Take 1 tablet by mouth every four hours as needed for pain 180 tablet 0   oxyCODONE-acetaminophen (PERCOCET) 10-325 MG tablet Take 1 tablet by mouth every 4 (four) hours as needed for pain. 180 tablet 0   oxyCODONE-acetaminophen (PERCOCET) 10-325 MG tablet Take 1 tablet by mouth every 4 hours as needed. 180 tablet 0   pravastatin (PRAVACHOL) 40 MG tablet Take 1 tablet (40 mg total) by mouth  daily. 90 tablet 3   promethazine-dextromethorphan (PROMETHAZINE-DM) 6.25-15 MG/5ML syrup Take 5 mLs by mouth 4 (four) times daily as needed. 118 mL 0   traZODone (DESYREL) 50 MG tablet Take 0.5-1 tablets (25-50 mg total) by mouth at bedtime as needed for sleep. 90 tablet 1   triamcinolone cream (KENALOG) 0.1 % Apply to affected area  2 (two) times daily. 100 g 0   vitamin B-12 (CYANOCOBALAMIN) 1000 MCG tablet Take 1 tablet (1,000 mcg total) by mouth daily. (Patient taking differently: Take 1,000 mcg by mouth 3 (three) times a week.) 90 tablet 3   No current facility-administered medications on file prior to visit.  ROS:  All others reviewed and negative.  Objective        PE:  BP 116/80 (BP Location: Right Arm, Patient Position: Sitting, Cuff Size: Normal)   Pulse 84   Temp 97.6 F (36.4 C) (Oral)   Ht _0  (1.651 m)   Wt 225 lb (102.1 kg)   SpO2 97%   BMI 37.44 kg/m                 Constitutional: Pt appears in NAD               HENT: Head: NCAT.                Right Ear: External ear normal.                 Left Ear: External ear normal.                Eyes: . Pupils are equal, round, and reactive to light. Conjunctivae and EOM are normal               Nose: without d/c or deformity               Neck: Neck supple. Gross normal ROM               Cardiovascular: Normal rate and regular rhythm.                 Pulmonary/Chest: Effort normal and breath sounds without rales or wheezing.                Abd:  Soft, NT, ND, + BS, no organomegaly               Neurological: Pt is alert. At baseline orientation, motor grossly intact               Skin: Skin is warm. No rashes, no other new lesions, LE edema - none               Psychiatric: Pt behavior is normal without agitation   Micro: none  Cardiac tracings I have personally interpreted today:  none  Pertinent Radiological findings (summarize): none   Lab Results  Component Value Date   WBC 7.0 02/19/2022   HGB 15.0  02/19/2022   HCT 45.2 02/19/2022   PLT 247.0 02/19/2022   GLUCOSE 97 02/19/2022   CHOL 144 02/19/2022   TRIG 98.0 02/19/2022   HDL 44.90 02/19/2022   LDLDIRECT 128.8 07/18/2008   LDLCALC 80 02/19/2022   ALT 31 02/19/2022   AST 24 02/19/2022   NA 140 02/19/2022   K 4.0 02/19/2022   CL 104 02/19/2022   CREATININE 0.84 02/19/2022   BUN 12 02/19/2022   CO2 28 02/19/2022   TSH 1.940 05/14/2022   HGBA1C 5.8 02/19/2022   MICROALBUR <0.7 08/10/2019   Assessment/Plan:  Pamela Gibson is a 56 y.o. American [place] or [place] Native [3] female with  has a past medical history of Anxiety, BACK PAIN (07/23/2007), Barrett's esophagus, CHEST PAIN (09/04/2010), Colon adenoma, Cyst of right ovary (07/03/2015), DEGENERATIVE JOINT DISEASE, LUMBAR SPINE (07/23/2007), Depression, Gastric polyp, GERD (07/22/2007), H/O total hysterectomy, HYPERLIPIDEMIA (07/22/2007), IBS (07/23/2007), MITRAL VALVE PROLAPSE (07/22/2007), Pinched nerve, Pneumonia, SINUSITIS- ACUTE-NOS (09/09/2007), TACHYCARDIA (08/20/2010), Tuberculosis, and Unspecified Vaginitis and Vulvovaginitis (12/17/2007).  HTN (hypertension) BP Readings from Last 3 Encounters:  08/21/22 116/80  07/14/22 112/64  05/22/22 134/82   Stable, pt to continue medical treatment losartan 25 mg qd  Hyperglycemia Lab Results  Component Value Date   HGBA1C 5.8 02/19/2022   Stable, pt to continue current medical treatment  - diet, wt control   Hyperlipidemia Lab Results  Component Value Date   LDLCALC 80 02/19/2022   Stable, pt to continue current statin pravachol 40 mg qd   Vitamin B12 deficiency Lab Results  Component Value Date   VITAMINB12 1,186 (H) 02/19/2022   Stable, cont oral replacement - b12 1000 mcg qd   Vitamin D deficiency Last vitamin D Lab Results  Component Value Date   VD25OH 58.55 02/19/2022   Stable, cont oral replacement  Followup: Return in about 6 months (around 02/20/2023).  Cathlean Cower, MD 08/21/2022 9:10  PM [facility]

## 2022-08-21 NOTE — Assessment & Plan Note (Signed)
Last vitamin D Lab Results  Component Value Date   VD25OH 58.55 02/19/2022   Stable, cont oral replacement

## 2022-08-21 NOTE — Patient Instructions (Signed)
Please remember to call your GYN for your exam  Please continue all other medications as before, and refills have been done if requested.  Please have the pharmacy call with any other refills you may need.  Please continue your efforts at being more active, low cholesterol diet, and weight control.  Please keep your appointments with your specialists as you may have planned  Please go to the LAB at the blood drawing area for the tests to be done  You will be contacted by phone if any changes need to be made immediately.  Otherwise, you will receive a letter about your results with an explanation, but please check with MyChart first.  Please remember to sign up for MyChart if you have not done so, as this will be important to you in the future with finding out test results, communicating by private email, and scheduling acute appointments online when needed.  Please make an Appointment to return in 6 months, or sooner if needed

## 2022-08-21 NOTE — Assessment & Plan Note (Signed)
Lab Results  Component Value Date   LDLCALC 80 02/19/2022   Stable, pt to continue current statin pravachol 40 mg qd

## 2022-08-21 NOTE — Assessment & Plan Note (Signed)
Lab Results  Component Value Date   VITAMINB12 1,186 (H) 02/19/2022   Stable, cont oral replacement - b12 1000 mcg qd

## 2022-08-21 NOTE — Assessment & Plan Note (Signed)
Lab Results  Component Value Date   HGBA1C 5.8 02/19/2022   Stable, pt to continue current medical treatment  - diet, wt control

## 2022-08-21 NOTE — Assessment & Plan Note (Signed)
BP Readings from Last 3 Encounters:  08/21/22 116/80  07/14/22 112/64  05/22/22 134/82   Stable, pt to continue medical treatment losartan 25 mg qd

## 2022-08-22 LAB — TSH: TSH: 2.12 u[IU]/mL (ref 0.35–5.50)

## 2022-08-22 LAB — VITAMIN D 25 HYDROXY (VIT D DEFICIENCY, FRACTURES): VITD: 43.21 ng/mL (ref 30.00–100.00)

## 2022-08-22 LAB — VITAMIN B12: Vitamin B-12: 635 pg/mL (ref 211–911)

## 2022-09-02 ENCOUNTER — Other Ambulatory Visit: Payer: Self-pay | Admitting: Internal Medicine

## 2022-09-02 ENCOUNTER — Telehealth: Payer: Self-pay | Admitting: Internal Medicine

## 2022-09-02 DIAGNOSIS — G894 Chronic pain syndrome: Secondary | ICD-10-CM

## 2022-09-02 NOTE — Telephone Encounter (Signed)
Patient called and said that a referral to a pain management clinic was supposed to be done in November - I don't see any record of this in chart.  Please call patient and let her know when the referral has been placed.  Patient's #  [id number]

## 2022-09-02 NOTE — Telephone Encounter (Signed)
Ok referral is done.

## 2022-09-02 NOTE — Telephone Encounter (Signed)
Patient seen by Dr.Crawford in November, I don't see where a referral was placed for pain management

## 2022-09-02 NOTE — Telephone Encounter (Signed)
Please refill as per office routine med refill policy (all routine meds to be refilled for 3 mo or monthly (per pt preference) up to one year from last visit, then month to month grace period for 3 mo, then further med refills will have to be denied) ? ?

## 2022-09-15 ENCOUNTER — Encounter (INDEPENDENT_AMBULATORY_CARE_PROVIDER_SITE_OTHER): Payer: Medicaid Other | Admitting: Ophthalmology

## 2022-09-15 DIAGNOSIS — H35371 Puckering of macula, right eye: Secondary | ICD-10-CM | POA: Diagnosis not present

## 2022-09-15 DIAGNOSIS — I10 Essential (primary) hypertension: Secondary | ICD-10-CM

## 2022-09-15 DIAGNOSIS — H35033 Hypertensive retinopathy, bilateral: Secondary | ICD-10-CM

## 2022-09-15 DIAGNOSIS — H34811 Central retinal vein occlusion, right eye, with macular edema: Secondary | ICD-10-CM

## 2022-09-15 DIAGNOSIS — H43813 Vitreous degeneration, bilateral: Secondary | ICD-10-CM

## 2022-09-18 ENCOUNTER — Other Ambulatory Visit (HOSPITAL_COMMUNITY): Payer: Self-pay

## 2022-09-18 DIAGNOSIS — M129 Arthropathy, unspecified: Secondary | ICD-10-CM | POA: Diagnosis not present

## 2022-09-18 DIAGNOSIS — E559 Vitamin D deficiency, unspecified: Secondary | ICD-10-CM | POA: Diagnosis not present

## 2022-09-18 DIAGNOSIS — M25552 Pain in left hip: Secondary | ICD-10-CM | POA: Diagnosis not present

## 2022-09-18 DIAGNOSIS — M25551 Pain in right hip: Secondary | ICD-10-CM | POA: Diagnosis not present

## 2022-09-18 DIAGNOSIS — R5383 Other fatigue: Secondary | ICD-10-CM | POA: Diagnosis not present

## 2022-09-18 DIAGNOSIS — Z6837 Body mass index (BMI) 37.0-37.9, adult: Secondary | ICD-10-CM | POA: Diagnosis not present

## 2022-09-18 DIAGNOSIS — M545 Low back pain, unspecified: Secondary | ICD-10-CM | POA: Diagnosis not present

## 2022-09-18 DIAGNOSIS — Z1159 Encounter for screening for other viral diseases: Secondary | ICD-10-CM | POA: Diagnosis not present

## 2022-09-18 DIAGNOSIS — Z79899 Other long term (current) drug therapy: Secondary | ICD-10-CM | POA: Diagnosis not present

## 2022-09-18 MED ORDER — NALOXONE HCL 4 MG/0.1ML NA LIQD
NASAL | 0 refills | Status: DC
  Filled 2022-09-18: qty 2, 1d supply, fill #0

## 2022-09-18 MED ORDER — OXYCODONE-ACETAMINOPHEN 10-325 MG PO TABS
1.0000 | ORAL_TABLET | ORAL | 0 refills | Status: DC | PRN
  Filled 2022-09-18: qty 42, 7d supply, fill #0

## 2022-09-22 DIAGNOSIS — Z79899 Other long term (current) drug therapy: Secondary | ICD-10-CM | POA: Diagnosis not present

## 2022-09-25 ENCOUNTER — Other Ambulatory Visit (HOSPITAL_COMMUNITY): Payer: Self-pay

## 2022-09-25 DIAGNOSIS — Z79899 Other long term (current) drug therapy: Secondary | ICD-10-CM | POA: Diagnosis not present

## 2022-09-25 DIAGNOSIS — Z6837 Body mass index (BMI) 37.0-37.9, adult: Secondary | ICD-10-CM | POA: Diagnosis not present

## 2022-09-25 DIAGNOSIS — M545 Low back pain, unspecified: Secondary | ICD-10-CM | POA: Diagnosis not present

## 2022-09-25 MED ORDER — OXYCODONE-ACETAMINOPHEN 10-325 MG PO TABS
ORAL_TABLET | ORAL | 0 refills | Status: AC
  Filled 2022-09-25: qty 180, 30d supply, fill #0

## 2022-09-29 DIAGNOSIS — Z79899 Other long term (current) drug therapy: Secondary | ICD-10-CM | POA: Diagnosis not present

## 2022-10-11 ENCOUNTER — Other Ambulatory Visit: Payer: Self-pay | Admitting: Internal Medicine

## 2022-10-21 ENCOUNTER — Other Ambulatory Visit (HOSPITAL_COMMUNITY): Payer: Self-pay

## 2022-10-21 ENCOUNTER — Other Ambulatory Visit: Payer: Self-pay

## 2022-10-24 ENCOUNTER — Other Ambulatory Visit (HOSPITAL_COMMUNITY): Payer: Self-pay

## 2022-10-24 DIAGNOSIS — Z6837 Body mass index (BMI) 37.0-37.9, adult: Secondary | ICD-10-CM | POA: Diagnosis not present

## 2022-10-24 DIAGNOSIS — M545 Low back pain, unspecified: Secondary | ICD-10-CM | POA: Diagnosis not present

## 2022-10-24 DIAGNOSIS — Z79899 Other long term (current) drug therapy: Secondary | ICD-10-CM | POA: Diagnosis not present

## 2022-10-24 MED ORDER — OXYCODONE-ACETAMINOPHEN 10-325 MG PO TABS
1.0000 | ORAL_TABLET | ORAL | 0 refills | Status: DC | PRN
  Filled 2022-10-24: qty 180, 30d supply, fill #0

## 2022-10-28 DIAGNOSIS — Z79899 Other long term (current) drug therapy: Secondary | ICD-10-CM | POA: Diagnosis not present

## 2022-11-09 ENCOUNTER — Other Ambulatory Visit: Payer: Self-pay | Admitting: Internal Medicine

## 2022-11-24 ENCOUNTER — Other Ambulatory Visit (HOSPITAL_COMMUNITY): Payer: Self-pay

## 2022-11-24 ENCOUNTER — Encounter (INDEPENDENT_AMBULATORY_CARE_PROVIDER_SITE_OTHER): Payer: Medicaid Other | Admitting: Ophthalmology

## 2022-11-24 DIAGNOSIS — H43813 Vitreous degeneration, bilateral: Secondary | ICD-10-CM

## 2022-11-24 DIAGNOSIS — I10 Essential (primary) hypertension: Secondary | ICD-10-CM | POA: Diagnosis not present

## 2022-11-24 DIAGNOSIS — H34811 Central retinal vein occlusion, right eye, with macular edema: Secondary | ICD-10-CM

## 2022-11-24 DIAGNOSIS — H35371 Puckering of macula, right eye: Secondary | ICD-10-CM | POA: Diagnosis not present

## 2022-11-24 DIAGNOSIS — H35033 Hypertensive retinopathy, bilateral: Secondary | ICD-10-CM | POA: Diagnosis not present

## 2022-11-25 ENCOUNTER — Other Ambulatory Visit (HOSPITAL_COMMUNITY): Payer: Self-pay

## 2022-11-25 DIAGNOSIS — Z6837 Body mass index (BMI) 37.0-37.9, adult: Secondary | ICD-10-CM | POA: Diagnosis not present

## 2022-11-25 DIAGNOSIS — Z79899 Other long term (current) drug therapy: Secondary | ICD-10-CM | POA: Diagnosis not present

## 2022-11-25 DIAGNOSIS — J069 Acute upper respiratory infection, unspecified: Secondary | ICD-10-CM | POA: Diagnosis not present

## 2022-11-25 DIAGNOSIS — M545 Low back pain, unspecified: Secondary | ICD-10-CM | POA: Diagnosis not present

## 2022-11-25 MED ORDER — AZITHROMYCIN 250 MG PO TABS
ORAL_TABLET | ORAL | 0 refills | Status: AC
  Filled 2022-11-25: qty 6, 5d supply, fill #0

## 2022-11-25 MED ORDER — OXYCODONE-ACETAMINOPHEN 10-325 MG PO TABS
1.0000 | ORAL_TABLET | ORAL | 0 refills | Status: DC | PRN
  Filled 2022-11-25: qty 180, 30d supply, fill #0

## 2022-11-25 MED ORDER — NALOXONE HCL 4 MG/0.1ML NA LIQD
NASAL | 0 refills | Status: AC
  Filled 2022-11-25: qty 2, 1d supply, fill #0

## 2022-12-23 ENCOUNTER — Other Ambulatory Visit (HOSPITAL_COMMUNITY): Payer: Self-pay

## 2022-12-23 DIAGNOSIS — Z79899 Other long term (current) drug therapy: Secondary | ICD-10-CM | POA: Diagnosis not present

## 2022-12-23 DIAGNOSIS — M545 Low back pain, unspecified: Secondary | ICD-10-CM | POA: Diagnosis not present

## 2022-12-23 MED ORDER — NALOXONE HCL 4 MG/0.1ML NA LIQD
NASAL | 0 refills | Status: AC
  Filled 2022-12-23: qty 2, 1d supply, fill #0

## 2022-12-23 MED ORDER — OXYCODONE-ACETAMINOPHEN 10-325 MG PO TABS
1.0000 | ORAL_TABLET | ORAL | 0 refills | Status: DC | PRN
  Filled 2022-12-23: qty 180, 30d supply, fill #0

## 2022-12-26 DIAGNOSIS — Z79899 Other long term (current) drug therapy: Secondary | ICD-10-CM | POA: Diagnosis not present

## 2023-01-20 ENCOUNTER — Other Ambulatory Visit (HOSPITAL_COMMUNITY): Payer: Self-pay

## 2023-01-20 ENCOUNTER — Other Ambulatory Visit: Payer: Self-pay | Admitting: Internal Medicine

## 2023-01-20 DIAGNOSIS — E7800 Pure hypercholesterolemia, unspecified: Secondary | ICD-10-CM | POA: Diagnosis not present

## 2023-01-20 DIAGNOSIS — Z79899 Other long term (current) drug therapy: Secondary | ICD-10-CM | POA: Diagnosis not present

## 2023-01-20 DIAGNOSIS — M545 Low back pain, unspecified: Secondary | ICD-10-CM | POA: Diagnosis not present

## 2023-01-20 DIAGNOSIS — Z6837 Body mass index (BMI) 37.0-37.9, adult: Secondary | ICD-10-CM | POA: Diagnosis not present

## 2023-01-20 DIAGNOSIS — E559 Vitamin D deficiency, unspecified: Secondary | ICD-10-CM | POA: Diagnosis not present

## 2023-01-20 MED ORDER — OXYCODONE-ACETAMINOPHEN 10-325 MG PO TABS
1.0000 | ORAL_TABLET | ORAL | 0 refills | Status: AC | PRN
  Filled 2023-01-20: qty 180, 30d supply, fill #0

## 2023-02-16 ENCOUNTER — Encounter (INDEPENDENT_AMBULATORY_CARE_PROVIDER_SITE_OTHER): Payer: Medicaid Other | Admitting: Ophthalmology

## 2023-02-16 DIAGNOSIS — H43813 Vitreous degeneration, bilateral: Secondary | ICD-10-CM

## 2023-02-16 DIAGNOSIS — H353112 Nonexudative age-related macular degeneration, right eye, intermediate dry stage: Secondary | ICD-10-CM | POA: Diagnosis not present

## 2023-02-16 DIAGNOSIS — H2513 Age-related nuclear cataract, bilateral: Secondary | ICD-10-CM

## 2023-02-16 DIAGNOSIS — H35033 Hypertensive retinopathy, bilateral: Secondary | ICD-10-CM

## 2023-02-16 DIAGNOSIS — I10 Essential (primary) hypertension: Secondary | ICD-10-CM | POA: Diagnosis not present

## 2023-02-16 DIAGNOSIS — H34811 Central retinal vein occlusion, right eye, with macular edema: Secondary | ICD-10-CM | POA: Diagnosis not present

## 2023-02-19 ENCOUNTER — Ambulatory Visit (INDEPENDENT_AMBULATORY_CARE_PROVIDER_SITE_OTHER): Payer: Medicaid Other | Admitting: Internal Medicine

## 2023-02-19 ENCOUNTER — Encounter: Payer: Self-pay | Admitting: Internal Medicine

## 2023-02-19 ENCOUNTER — Other Ambulatory Visit: Payer: Self-pay | Admitting: Internal Medicine

## 2023-02-19 VITALS — BP 124/78 | HR 66 | Temp 98.6°F | Ht 65.0 in | Wt 225.0 lb

## 2023-02-19 DIAGNOSIS — R739 Hyperglycemia, unspecified: Secondary | ICD-10-CM | POA: Diagnosis not present

## 2023-02-19 DIAGNOSIS — E785 Hyperlipidemia, unspecified: Secondary | ICD-10-CM

## 2023-02-19 DIAGNOSIS — Z0001 Encounter for general adult medical examination with abnormal findings: Secondary | ICD-10-CM | POA: Diagnosis not present

## 2023-02-19 DIAGNOSIS — L918 Other hypertrophic disorders of the skin: Secondary | ICD-10-CM | POA: Diagnosis not present

## 2023-02-19 DIAGNOSIS — E7800 Pure hypercholesterolemia, unspecified: Secondary | ICD-10-CM

## 2023-02-19 DIAGNOSIS — E538 Deficiency of other specified B group vitamins: Secondary | ICD-10-CM | POA: Diagnosis not present

## 2023-02-19 DIAGNOSIS — Z1231 Encounter for screening mammogram for malignant neoplasm of breast: Secondary | ICD-10-CM

## 2023-02-19 DIAGNOSIS — I700 Atherosclerosis of aorta: Secondary | ICD-10-CM | POA: Diagnosis not present

## 2023-02-19 DIAGNOSIS — E559 Vitamin D deficiency, unspecified: Secondary | ICD-10-CM | POA: Diagnosis not present

## 2023-02-19 DIAGNOSIS — I10 Essential (primary) hypertension: Secondary | ICD-10-CM | POA: Diagnosis not present

## 2023-02-19 LAB — CBC WITH DIFFERENTIAL/PLATELET
Basophils Absolute: 0.1 10*3/uL (ref 0.0–0.1)
Basophils Relative: 0.9 % (ref 0.0–3.0)
Eosinophils Absolute: 0.2 10*3/uL (ref 0.0–0.7)
Eosinophils Relative: 2.9 % (ref 0.0–5.0)
HCT: 46.9 % — ABNORMAL HIGH (ref 36.0–46.0)
Hemoglobin: 15.4 g/dL — ABNORMAL HIGH (ref 12.0–15.0)
Lymphocytes Relative: 36.6 % (ref 12.0–46.0)
Lymphs Abs: 2.8 10*3/uL (ref 0.7–4.0)
MCHC: 32.9 g/dL (ref 30.0–36.0)
MCV: 89 fl (ref 78.0–100.0)
Monocytes Absolute: 0.6 10*3/uL (ref 0.1–1.0)
Monocytes Relative: 8.1 % (ref 3.0–12.0)
Neutro Abs: 3.9 10*3/uL (ref 1.4–7.7)
Neutrophils Relative %: 51.5 % (ref 43.0–77.0)
Platelets: 257 10*3/uL (ref 150.0–400.0)
RBC: 5.27 Mil/uL — ABNORMAL HIGH (ref 3.87–5.11)
RDW: 12.7 % (ref 11.5–15.5)
WBC: 7.5 10*3/uL (ref 4.0–10.5)

## 2023-02-19 LAB — BASIC METABOLIC PANEL
BUN: 16 mg/dL (ref 6–23)
CO2: 29 mEq/L (ref 19–32)
Calcium: 10 mg/dL (ref 8.4–10.5)
Chloride: 101 mEq/L (ref 96–112)
Creatinine, Ser: 0.98 mg/dL (ref 0.40–1.20)
GFR: 63.97 mL/min (ref >60.00)
Glucose, Bld: 92 mg/dL (ref 70–99)
Potassium: 4.2 mEq/L (ref 3.5–5.1)
Sodium: 139 mEq/L (ref 135–145)

## 2023-02-19 LAB — VITAMIN B12: Vitamin B-12: 604 pg/mL (ref 211–911)

## 2023-02-19 LAB — LIPID PANEL
Cholesterol: 171 mg/dL (ref 0–200)
HDL: 50.7 mg/dL (ref >39.00)
LDL Cholesterol: 98 mg/dL (ref 0–99)
NonHDL: 120.2
Total CHOL/HDL Ratio: 3
Triglycerides: 111 mg/dL (ref 0.0–149.0)
VLDL: 22.2 mg/dL (ref 0.0–40.0)

## 2023-02-19 LAB — MICROALBUMIN / CREATININE URINE RATIO
Creatinine,U: 48.8 mg/dL
Microalb Creat Ratio: 1.4 mg/g (ref 0.0–30.0)
Microalb, Ur: <0.7 mg/dL (ref 0.0–1.9)

## 2023-02-19 LAB — HEPATIC FUNCTION PANEL
ALT: 21 U/L (ref 0–35)
AST: 21 U/L (ref 0–37)
Albumin: 4.2 g/dL (ref 3.5–5.2)
Alkaline Phosphatase: 50 U/L (ref 39–117)
Bilirubin, Direct: 0.1 mg/dL (ref 0.0–0.3)
Total Bilirubin: 0.6 mg/dL (ref 0.2–1.2)
Total Protein: 7.2 g/dL (ref 6.0–8.3)

## 2023-02-19 LAB — URINALYSIS, ROUTINE W REFLEX MICROSCOPIC
Bilirubin Urine: NEGATIVE
Ketones, ur: NEGATIVE
Leukocytes,Ua: NEGATIVE
Nitrite: NEGATIVE
Specific Gravity, Urine: 1.01 (ref 1.000–1.030)
Total Protein, Urine: NEGATIVE
Urine Glucose: NEGATIVE
Urobilinogen, UA: 0.2 (ref 0.0–1.0)
pH: 6.5 (ref 5.0–8.0)

## 2023-02-19 LAB — HEMOGLOBIN A1C: Hgb A1c MFr Bld: 5.7 % (ref 4.6–6.5)

## 2023-02-19 LAB — TSH: TSH: 1.76 u[IU]/mL (ref 0.35–5.50)

## 2023-02-19 LAB — VITAMIN D 25 HYDROXY (VIT D DEFICIENCY, FRACTURES): VITD: 49.87 ng/mL (ref 30.00–100.00)

## 2023-02-19 MED ORDER — PANTOPRAZOLE SODIUM 40 MG PO TBEC: 40.0000 mg | DELAYED_RELEASE_TABLET | Freq: Every day | ORAL | 3 refills | Status: DC

## 2023-02-19 NOTE — Patient Instructions (Signed)
Please continue all other medications as before, and refills have been done if requested.  Please have the pharmacy call with any other refills you may need.  Please continue your efforts at being more active, low cholesterol diet, and weight control.  You are otherwise up to date with prevention measures today.  Please keep your appointments with your specialists as you may have planned  You will be contacted regarding the referral for: dermatology  Please go to the LAB at the blood drawing area for the tests to be done  You will be contacted by phone if any changes need to be made immediately.  Otherwise, you will receive a letter about your results with an explanation, but please check with MyChart first.  Please remember to sign up for MyChart if you have not done so, as this will be important to you in the future with finding out test results, communicating by private email, and scheduling acute appointments online when needed.  Please make an Appointment to return in 6 months, or sooner if needed 

## 2023-02-19 NOTE — Progress Notes (Signed)
Patient ID: Pamela Gibson, female   DOB: 1965-09-13, 57 y.o.   MRN: [id number]         Chief Complaint:: wellness exam and skin tag to left upper back, hld, hyperglycemia, low b12 and D, aortic atherosclerosis, htn       HPI:  Pamela Gibson is a 57 y.o. female here for wellness exam; declines covid booster, has mammogram in July 2024, o/w up to date                        Also Pt denies chest pain, increased sob or doe, wheezing, orthopnea, PND, increased LE swelling, palpitations, dizziness or syncope.   Pt denies polydipsia, polyuria, or new focal neuro s/s.    Pt denies fever, wt loss, night sweats, loss of appetite, or other constitutional symptoms  Does have quit irritating worsening skin tag to left upper back at the bra line.     Wt Readings from Last 3 Encounters:  02/19/23 225 lb (102.1 kg)  08/21/22 225 lb (102.1 kg)  07/14/22 223 lb (101.2 kg)   BP Readings from Last 3 Encounters:  02/19/23 124/78  08/21/22 116/80  07/14/22 112/64   Immunization History  Administered Date(s) Administered   Influenza Inj Mdck Quad Pf 04/18/2018   Influenza Whole 07/23/2007, 08/02/2009   Influenza,inj,Quad PF,6+ Mos 04/17/2017, 04/20/2019, 05/05/2020, 05/05/2021   Influenza-Unspecified 05/31/2015, 05/28/2016, 05/17/2017, 04/20/2019, 05/05/2020, 05/15/2022   PFIZER(Purple Top)SARS-COV-2 Vaccination 12/03/2019, 12/26/2019, 08/10/2020   PPD Test 03/21/2013, 03/21/2014   Pfizer Covid-19 Vaccine Bivalent Booster 44yrs & up 06/03/2021   Td 08/26/2003   Tdap 12/02/2013   Zoster Recombinant(Shingrix) 02/13/2021, 07/09/2021   There are no preventive care reminders to display for this patient.     Past Medical History:  Diagnosis Date   Anxiety    BACK PAIN 07/23/2007   Barrett's esophagus    CHEST PAIN 09/04/2010   Colon adenoma    Cyst of right ovary 07/03/2015   DEGENERATIVE JOINT DISEASE, LUMBAR SPINE 07/23/2007   Depression    Gastric polyp    x3   GERD 07/22/2007   H/O total  hysterectomy    HYPERLIPIDEMIA 07/22/2007   IBS 07/23/2007   MITRAL VALVE PROLAPSE 07/22/2007   Pinched nerve    LEFT SIDE, DISC 3,4,5   Pneumonia    YEARS AGO   SINUSITIS- ACUTE-NOS 09/09/2007   TACHYCARDIA 08/20/2010   Tuberculosis     MOTHER HAD YEARS AGO   Unspecified Vaginitis and Vulvovaginitis 12/17/2007   Past Surgical History:  Procedure Laterality Date   ABDOMINAL HYSTERECTOMY     BIOPSY  04/17/2022   Procedure: BIOPSY;  Surgeon: Imogene Burn, MD;  Location: [facility];  Service: Gastroenterology;;   CHOLECYSTECTOMY     COLONOSCOPY WITH PROPOFOL N/A 04/17/2022   Procedure: COLONOSCOPY WITH PROPOFOL;  Surgeon: Imogene Burn, MD;  Location: [facility];  Service: Gastroenterology;  Laterality: N/A;   CYSTOSCOPY  11/11/2017   Procedure: CYSTOSCOPY;  Surgeon: Myna Hidalgo, DO;  Location: [facility];  Service: Gynecology;;   ESOPHAGOGASTRODUODENOSCOPY (EGD) WITH PROPOFOL N/A 04/17/2022   Procedure: ESOPHAGOGASTRODUODENOSCOPY (EGD) WITH PROPOFOL;  Surgeon: Imogene Burn, MD;  Location: [facility];  Service: Gastroenterology;  Laterality: N/A;   LAPAROSCOPIC BILATERAL SALPINGO OOPHERECTOMY Bilateral 11/11/2017   Procedure: LAPAROSCOPIC BILATERAL SALPINGO OOPHORECTOMY;  Surgeon: Myna Hidalgo, DO;  Location: [facility];  Service: Gynecology;  Laterality: Bilateral;   POLYPECTOMY  04/17/2022   Procedure: POLYPECTOMY;  Surgeon: Imogene Burn, MD;  Location: [facility];  Service: Gastroenterology;;   RLE tib/fib fx/surgury     TONSILLECTOMY     TUBAL LIGATION      reports that she has never smoked. She has never used smokeless tobacco. She reports current alcohol use. She reports that she does not use drugs. family history includes Angina in her mother; Colon polyps in her mother; Coronary artery disease in her mother; Diabetes in an other family member; Heart disease in her mother; Hyperlipidemia in an other family member; Hypertension in an other family member; Stroke in her  mother. Allergies  Allergen Reactions   Atorvastatin     REACTION: more forgetful   Ibuprofen     INSTRUCTED BY MD NOT TO TAKE DUE TO GI UPSET   Morphine Sulfate Er Other (See Comments)    Chest hurt   Penicillin G Itching   Penicillins     turns blue, childhood allergy Has patient had a PCN reaction causing immediate rash, facial/tongue/throat swelling, SOB or lightheadedness with hypotension: Yes Has patient had a PCN reaction causing severe rash involving mucus membranes or skin necrosis: No Has patient had a PCN reaction that required hospitalization: Unknown  Has patient had a PCN reaction occurring within the last 10 years: No If all of the above answers are "NO", then may proceed with Cephalosporin use.    Simvastatin     REACTION: more forgetful   Tetracycline Itching   Tetracycline Hcl Itching   Current Outpatient Medications on File Prior to Visit  Medication Sig Dispense Refill   ALPRAZolam (XANAX) 1 MG tablet Take 1 tablet by mouth three times daily as needed 90 tablet 2   aspirin 81 MG EC tablet Take 81 mg by mouth every other day.     benzonatate (TESSALON) 200 MG capsule Take 1 capsule (200 mg total) by mouth 3 (three) times daily as needed. 60 capsule 0   calcium carbonate (TUMS - DOSED IN MG ELEMENTAL CALCIUM) 500 MG chewable tablet Chew 1-2 tablets by mouth daily as needed for indigestion or heartburn.     Cholecalciferol (VITAMIN D3) 25 MCG (1000 UT) CAPS Take 2,000 Units by mouth daily.     [employer] PURELAX 17 GM/SCOOP powder Take 0.5 Containers by mouth daily as needed for mild constipation or moderate constipation.     gabapentin (NEURONTIN) 100 MG capsule Take 1 capsule (100 mg total) by mouth 3 (three) times daily. 270 capsule 1   lidocaine (LIDODERM) 5 % Place 1-3 patches onto skin as directed. (Patient taking differently: Place 1 patch onto the skin daily as needed (pain).) 270 patch 0   lidocaine (LIDODERM) 5 % Place 1 - 3 patches onto the skin as directed.  Patches may be worn for up to 12 hours and must be off for 12 hours between uses. 270 patch 0   losartan (COZAAR) 25 MG tablet TAKE 1 TABLET BY MOUTH ONCE DAILY. 90 tablet 3   Multiple Vitamins-Minerals (MULTIVITAMIN WITH MINERALS) tablet Take 1 tablet by mouth daily.     naloxone (NARCAN) nasal spray 4 mg/0.1 mL Use  1 spray every 3 minutes; spray 1 dose into ONE nostril; alternate nostrils w each dose until help arrives 2 each 0   naloxone (NARCAN) nasal spray 4 mg/0.1 mL Spray 1 spray every 3 minutes; spray into ONE nostril; alternate nostrils w each dose until help arrives. 2 each 0   NARCAN 4 MG/0.1ML LIQD nasal spray kit Place 1 spray into the nose once.  oxyCODONE-acetaminophen (PERCOCET) 10-325 MG tablet Take 1 tablet by mouth every four hours as needed for pain 180 tablet 0   oxyCODONE-acetaminophen (PERCOCET) 10-325 MG tablet Take 1 tablet by mouth every 4 (four) hours as needed for pain. 180 tablet 0   oxyCODONE-acetaminophen (PERCOCET) 10-325 MG tablet Take 1 tablet by mouth every 4 (four) hours as needed. 42 tablet 0   oxyCODONE-acetaminophen (PERCOCET) 10-325 MG tablet Take 1 tablet by mouth every 4 hours as needed 180 tablet 0   oxyCODONE-acetaminophen (PERCOCET) 10-325 MG tablet Take 1 tablet by mouth every 4 hours as needed. 180 tablet 0   pravastatin (PRAVACHOL) 40 MG tablet Take 1 tablet (40 mg total) by mouth daily. 90 tablet 3   promethazine-dextromethorphan (PROMETHAZINE-DM) 6.25-15 MG/5ML syrup Take 5 mLs by mouth 4 (four) times daily as needed. 118 mL 0   traZODone (DESYREL) 50 MG tablet Take 0.5-1 tablets (25-50 mg total) by mouth at bedtime as needed for sleep. 90 tablet 1   triamcinolone cream (KENALOG) 0.1 % Apply to affected area  2 (two) times daily. 100 g 0   vitamin B-12 (CYANOCOBALAMIN) 1000 MCG tablet Take 1 tablet (1,000 mcg total) by mouth daily. (Patient taking differently: Take 1,000 mcg by mouth 3 (three) times a week.) 90 tablet 3   No current  facility-administered medications on file prior to visit.        ROS:  All others reviewed and negative.  Objective        PE:  BP 124/78 (BP Location: Right Arm, Patient Position: Sitting, Cuff Size: Normal)   Pulse 66   Temp 98.6 F (37 C) (Oral)   Ht 5\' 5"  (1.651 m)   Wt 225 lb (102.1 kg)   SpO2 97%   BMI 37.44 kg/m                 Constitutional: Pt appears in NAD               HENT: Head: NCAT.                Right Ear: External ear normal.                 Left Ear: External ear normal.                Eyes: . Pupils are equal, round, and reactive to light. Conjunctivae and EOM are normal               Nose: without d/c or deformity               Neck: Neck supple. Gross normal ROM               Cardiovascular: Normal rate and regular rhythm.                 Pulmonary/Chest: Effort normal and breath sounds without rales or wheezing.                Abd:  Soft, NT, ND, + BS, no organomegaly               Neurological: Pt is alert. At baseline orientation, motor grossly intact               Skin: Skin is warm. No rashes, no other new lesions, LE edema - none               Psychiatric: Pt behavior is normal without agitation   Micro: none  Cardiac tracings I have personally interpreted today:  none  Pertinent Radiological findings (summarize): none   Lab Results  Component Value Date   WBC 7.5 02/19/2023   HGB 15.4 (H) 02/19/2023   HCT 46.9 (H) 02/19/2023   PLT 257.0 02/19/2023   GLUCOSE 92 02/19/2023   CHOL 171 02/19/2023   TRIG 111.0 02/19/2023   HDL 50.70 02/19/2023   LDLDIRECT 128.8 07/18/2008   LDLCALC 98 02/19/2023   ALT 21 02/19/2023   AST 21 02/19/2023   NA 139 02/19/2023   K 4.2 02/19/2023   CL 101 02/19/2023   CREATININE 0.98 02/19/2023   BUN 16 02/19/2023   CO2 29 02/19/2023   TSH 1.76 02/19/2023   HGBA1C 5.7 02/19/2023   MICROALBUR <0.7 02/19/2023   Assessment/Plan:  JANIJAH VELLA is a 57 y.o. American [place] or [place] Native [3] female with   has a past medical history of Anxiety, BACK PAIN (07/23/2007), Barrett's esophagus, CHEST PAIN (09/04/2010), Colon adenoma, Cyst of right ovary (07/03/2015), DEGENERATIVE JOINT DISEASE, LUMBAR SPINE (07/23/2007), Depression, Gastric polyp, GERD (07/22/2007), H/O total hysterectomy, HYPERLIPIDEMIA (07/22/2007), IBS (07/23/2007), MITRAL VALVE PROLAPSE (07/22/2007), Pinched nerve, Pneumonia, SINUSITIS- ACUTE-NOS (09/09/2007), TACHYCARDIA (08/20/2010), Tuberculosis, and Unspecified Vaginitis and Vulvovaginitis (12/17/2007).  Encounter for well adult exam with abnormal findings Age and sex appropriate education and counseling updated with regular exercise and diet Referrals for preventative services - has mammogram July 2024 Immunizations addressed - declines covid booster Smoking counseling  - none needed Evidence for depression or other mood disorder - none significant Most recent labs reviewed. I have personally reviewed and have noted: 1) the patient's medical and social history 2) The patient's current medications and supplements 3) The patient's height, weight, and BMI have been recorded in the chart   Hyperlipidemia Lab Results  Component Value Date   LDLCALC 98 02/19/2023   Uncontrolled, goal ldl < 70, pt to continue current statin pravachol 40 every day and lower chol diet as o/w declines change   Hyperglycemia Lab Results  Component Value Date   HGBA1C 5.7 02/19/2023   Stable, pt to continue current medical treatment  - diet, wt control   Vitamin B12 deficiency Lab Results  Component Value Date   VITAMINB12 604 02/19/2023   Stable, cont oral replacement - b12 1000 mcg qd   Vitamin D deficiency Last vitamin D Lab Results  Component Value Date   VD25OH 49.87 02/19/2023   Stable, cont oral replacement   Aortic atherosclerosis (HCC) Pt for exercise, low chol diet, continue pravachol 40 qd  HTN (hypertension) BP Readings from Last 3 Encounters:  02/19/23 124/78   08/21/22 116/80  07/14/22 112/64   Stable, pt to continue medical treatment losartan 25 qd   Skin tag New worsening, for derm referral  Followup: Return in about 6 months (around 08/21/2023).  Oliver Barre, MD 02/22/2023 5:22 PM [facility]

## 2023-02-19 NOTE — Progress Notes (Signed)
The test results show that your current treatment is OK, as the tests are stable.  Please continue the same plan.  There is no other need for change of treatment or further evaluation based on these results, at this time.  thanks 

## 2023-02-20 ENCOUNTER — Other Ambulatory Visit: Payer: Self-pay

## 2023-02-20 ENCOUNTER — Other Ambulatory Visit (HOSPITAL_COMMUNITY): Payer: Self-pay

## 2023-02-20 DIAGNOSIS — Z79899 Other long term (current) drug therapy: Secondary | ICD-10-CM | POA: Diagnosis not present

## 2023-02-20 DIAGNOSIS — Z6837 Body mass index (BMI) 37.0-37.9, adult: Secondary | ICD-10-CM | POA: Diagnosis not present

## 2023-02-20 DIAGNOSIS — M545 Low back pain, unspecified: Secondary | ICD-10-CM | POA: Diagnosis not present

## 2023-02-20 DIAGNOSIS — K219 Gastro-esophageal reflux disease without esophagitis: Secondary | ICD-10-CM | POA: Diagnosis not present

## 2023-02-20 MED ORDER — OXYCODONE-ACETAMINOPHEN 10-325 MG PO TABS
ORAL_TABLET | ORAL | 0 refills | Status: AC
  Filled 2023-02-20: qty 150, 30d supply, fill #0

## 2023-02-22 ENCOUNTER — Encounter: Payer: Self-pay | Admitting: Internal Medicine

## 2023-02-22 NOTE — Assessment & Plan Note (Signed)
Lab Results  Component Value Date   HGBA1C 5.7 02/19/2023   Stable, pt to continue current medical treatment  - diet, wt control

## 2023-02-22 NOTE — Assessment & Plan Note (Signed)
Age and sex appropriate education and counseling updated with regular exercise and diet Referrals for preventative services - has mammogram July 2024 Immunizations addressed - declines covid booster Smoking counseling  - none needed Evidence for depression or other mood disorder - none significant Most recent labs reviewed. I have personally reviewed and have noted: 1) the patient's medical and social history 2) The patient's current medications and supplements 3) The patient's height, weight, and BMI have been recorded in the chart

## 2023-02-22 NOTE — Assessment & Plan Note (Signed)
Lab Results  Component Value Date   LDLCALC 98 02/19/2023   Uncontrolled, goal ldl < 70, pt to continue current statin pravachol 40 every day and lower chol diet as o/w declines change

## 2023-02-22 NOTE — Assessment & Plan Note (Signed)
Last vitamin D Lab Results  Component Value Date   VD25OH 49.87 02/19/2023   Stable, cont oral replacement

## 2023-02-22 NOTE — Assessment & Plan Note (Signed)
BP Readings from Last 3 Encounters:  02/19/23 124/78  08/21/22 116/80  07/14/22 112/64   Stable, pt to continue medical treatment losartan 25 qd

## 2023-02-22 NOTE — Assessment & Plan Note (Signed)
New worsening, for derm referral 

## 2023-02-22 NOTE — Assessment & Plan Note (Signed)
Lab Results  Component Value Date   VITAMINB12 604 02/19/2023   Stable, cont oral replacement - b12 1000 mcg qd

## 2023-02-22 NOTE — Assessment & Plan Note (Signed)
Pt for exercise, low chol diet, continue pravachol 40 qd

## 2023-02-23 ENCOUNTER — Ambulatory Visit
Admission: RE | Admit: 2023-02-23 | Discharge: 2023-02-23 | Disposition: A | Discharge status: Home / Self Care | Payer: Medicaid Other | Source: Ambulatory Visit | Attending: Internal Medicine | Admitting: Internal Medicine

## 2023-02-23 DIAGNOSIS — Z1231 Encounter for screening mammogram for malignant neoplasm of breast: Secondary | ICD-10-CM

## 2023-03-20 ENCOUNTER — Other Ambulatory Visit (HOSPITAL_COMMUNITY): Payer: Self-pay

## 2023-03-20 DIAGNOSIS — R030 Elevated blood-pressure reading, without diagnosis of hypertension: Secondary | ICD-10-CM | POA: Diagnosis not present

## 2023-03-20 DIAGNOSIS — Z6838 Body mass index (BMI) 38.0-38.9, adult: Secondary | ICD-10-CM | POA: Diagnosis not present

## 2023-03-20 DIAGNOSIS — Z79899 Other long term (current) drug therapy: Secondary | ICD-10-CM | POA: Diagnosis not present

## 2023-03-20 DIAGNOSIS — E6609 Other obesity due to excess calories: Secondary | ICD-10-CM | POA: Diagnosis not present

## 2023-03-20 DIAGNOSIS — M545 Low back pain, unspecified: Secondary | ICD-10-CM | POA: Diagnosis not present

## 2023-03-20 MED ORDER — OXYCODONE-ACETAMINOPHEN 10-325 MG PO TABS
1.0000 | ORAL_TABLET | Freq: Every day | ORAL | 0 refills | Status: AC
  Filled 2023-03-20: qty 150, 30d supply, fill #0

## 2023-03-24 DIAGNOSIS — Z79899 Other long term (current) drug therapy: Secondary | ICD-10-CM | POA: Diagnosis not present

## 2023-04-01 DIAGNOSIS — M79642 Pain in left hand: Secondary | ICD-10-CM | POA: Diagnosis not present

## 2023-04-01 DIAGNOSIS — M25571 Pain in right ankle and joints of right foot: Secondary | ICD-10-CM | POA: Diagnosis not present

## 2023-04-01 DIAGNOSIS — M79641 Pain in right hand: Secondary | ICD-10-CM | POA: Diagnosis not present

## 2023-04-06 DIAGNOSIS — E559 Vitamin D deficiency, unspecified: Secondary | ICD-10-CM | POA: Diagnosis not present

## 2023-04-06 DIAGNOSIS — R5383 Other fatigue: Secondary | ICD-10-CM | POA: Diagnosis not present

## 2023-04-06 DIAGNOSIS — M2550 Pain in unspecified joint: Secondary | ICD-10-CM | POA: Diagnosis not present

## 2023-04-07 DIAGNOSIS — L918 Other hypertrophic disorders of the skin: Secondary | ICD-10-CM | POA: Diagnosis not present

## 2023-04-07 DIAGNOSIS — H5213 Myopia, bilateral: Secondary | ICD-10-CM | POA: Diagnosis not present

## 2023-04-16 DIAGNOSIS — M19041 Primary osteoarthritis, right hand: Secondary | ICD-10-CM | POA: Diagnosis not present

## 2023-04-16 DIAGNOSIS — M19042 Primary osteoarthritis, left hand: Secondary | ICD-10-CM | POA: Diagnosis not present

## 2023-04-17 ENCOUNTER — Other Ambulatory Visit (HOSPITAL_COMMUNITY): Payer: Self-pay

## 2023-04-17 DIAGNOSIS — Z79899 Other long term (current) drug therapy: Secondary | ICD-10-CM | POA: Diagnosis not present

## 2023-04-17 DIAGNOSIS — Z6837 Body mass index (BMI) 37.0-37.9, adult: Secondary | ICD-10-CM | POA: Diagnosis not present

## 2023-04-17 DIAGNOSIS — E6609 Other obesity due to excess calories: Secondary | ICD-10-CM | POA: Diagnosis not present

## 2023-04-17 DIAGNOSIS — M545 Low back pain, unspecified: Secondary | ICD-10-CM | POA: Diagnosis not present

## 2023-04-17 MED ORDER — OXYCODONE-ACETAMINOPHEN 10-325 MG PO TABS
1.0000 | ORAL_TABLET | Freq: Every day | ORAL | 0 refills | Status: AC
  Filled 2023-04-17 – 2023-04-28 (×3): qty 150, 30d supply, fill #0

## 2023-04-18 ENCOUNTER — Other Ambulatory Visit (HOSPITAL_COMMUNITY): Payer: Self-pay

## 2023-04-21 ENCOUNTER — Other Ambulatory Visit (HOSPITAL_COMMUNITY): Payer: Self-pay

## 2023-04-21 DIAGNOSIS — Z79899 Other long term (current) drug therapy: Secondary | ICD-10-CM | POA: Diagnosis not present

## 2023-04-23 ENCOUNTER — Other Ambulatory Visit: Payer: Self-pay | Admitting: Internal Medicine

## 2023-04-24 ENCOUNTER — Other Ambulatory Visit (HOSPITAL_COMMUNITY): Payer: Self-pay

## 2023-04-24 ENCOUNTER — Other Ambulatory Visit: Payer: Self-pay

## 2023-04-25 ENCOUNTER — Other Ambulatory Visit (HOSPITAL_BASED_OUTPATIENT_CLINIC_OR_DEPARTMENT_OTHER): Payer: Self-pay

## 2023-04-25 ENCOUNTER — Other Ambulatory Visit (HOSPITAL_COMMUNITY): Payer: Self-pay

## 2023-04-28 ENCOUNTER — Other Ambulatory Visit (HOSPITAL_COMMUNITY): Payer: Self-pay

## 2023-05-06 ENCOUNTER — Other Ambulatory Visit (HOSPITAL_COMMUNITY): Payer: Self-pay

## 2023-05-06 MED ORDER — INFLUENZA VIRUS VACC SPLIT PF (FLUZONE) 0.5 ML IM SUSY
0.5000 mL | PREFILLED_SYRINGE | Freq: Once | INTRAMUSCULAR | 0 refills | Status: AC
  Filled 2023-05-06: qty 0.5, 1d supply, fill #0

## 2023-05-07 NOTE — Progress Notes (Signed)
Subjective:    Patient ID: Pamela Gibson, female    DOB: [birth year], 57 y.o.   MRN: [id number]      HPI Pamela Gibson is here for  Chief Complaint  Patient presents with   Nasal Congestion    Nasal congestion, cough x 4 days    She is here for an acute visit for cold symptoms.   Her symptoms started 9/8 - 5 days ago  She is experiencing nasal congestion, bilateral ear pain, PND, sore throat, hoarseness, dry cough and lightheadedness.  She denies any fevers, shortness of breath, wheezing, sinus pressure or headaches.  She has tried taking cough drops.  She is on chronic pain medication that has Tylenol in it and does not take anything else for pain      Medications and allergies reviewed with patient and updated if appropriate.  Current Outpatient Medications on File Prior to Visit  Medication Sig Dispense Refill   ALPRAZolam (XANAX) 1 MG tablet Take 1 tablet by mouth three times daily as needed 90 tablet 2   aspirin 81 MG EC tablet Take 81 mg by mouth every other day.     benzonatate (TESSALON) 200 MG capsule Take 1 capsule (200 mg total) by mouth 3 (three) times daily as needed. 60 capsule 0   calcium carbonate (TUMS - DOSED IN MG ELEMENTAL CALCIUM) 500 MG chewable tablet Chew 1-2 tablets by mouth daily as needed for indigestion or heartburn.     celecoxib (CELEBREX) 200 MG capsule Take 200 mg by mouth daily as needed.     Cholecalciferol (VITAMIN D3) 25 MCG (1000 UT) CAPS Take 2,000 Units by mouth daily.     [employer] PURELAX 17 GM/SCOOP powder Take 0.5 Containers by mouth daily as needed for mild constipation or moderate constipation.     gabapentin (NEURONTIN) 100 MG capsule Take 1 capsule (100 mg total) by mouth 3 (three) times daily. 270 capsule 1   lidocaine (LIDODERM) 5 % Place 1-3 patches onto skin as directed. (Patient taking differently: Place 1 patch onto the skin daily as needed (pain).) 270 patch 0   lidocaine (LIDODERM) 5 % Place 1 - 3 patches onto the skin as  directed. Patches may be worn for up to 12 hours and must be off for 12 hours between uses. 270 patch 0   losartan (COZAAR) 25 MG tablet TAKE 1 TABLET BY MOUTH ONCE DAILY. 90 tablet 3   Multiple Vitamins-Minerals (MULTIVITAMIN WITH MINERALS) tablet Take 1 tablet by mouth daily.     naloxone (NARCAN) nasal spray 4 mg/0.1 mL Use  1 spray every 3 minutes; spray 1 dose into ONE nostril; alternate nostrils w each dose until help arrives 2 each 0   naloxone (NARCAN) nasal spray 4 mg/0.1 mL Spray 1 spray every 3 minutes; spray into ONE nostril; alternate nostrils w each dose until help arrives. 2 each 0   NARCAN 4 MG/0.1ML LIQD nasal spray kit Place 1 spray into the nose once.     oxyCODONE-acetaminophen (PERCOCET) 10-325 MG tablet Take 1 tablet by mouth every four hours as needed for pain 180 tablet 0   oxyCODONE-acetaminophen (PERCOCET) 10-325 MG tablet Take 1 tablet by mouth every 4 (four) hours as needed for pain. 180 tablet 0   oxyCODONE-acetaminophen (PERCOCET) 10-325 MG tablet Take 1 tablet by mouth every 4 (four) hours as needed. 42 tablet 0   oxyCODONE-acetaminophen (PERCOCET) 10-325 MG tablet Take 1 tablet by mouth every 4 hours as needed 180 tablet 0  oxyCODONE-acetaminophen (PERCOCET) 10-325 MG tablet Take 1 tablet by mouth every 4 hours as needed. 180 tablet 0   oxyCODONE-acetaminophen (PERCOCET) 10-325 MG tablet Take 1 tablet by mouth five times a day. 150 tablet 0   oxyCODONE-acetaminophen (PERCOCET) 10-325 MG tablet Take 1 tablet by mouth 5 times daily. 150 tablet 0   oxyCODONE-acetaminophen (PERCOCET) 10-325 MG tablet Take 1 tablet by mouth 5 (five) times daily. 150 tablet 0   pantoprazole (PROTONIX) 40 MG tablet Take 1 tablet (40 mg total) by mouth daily. 90 tablet 3   pravastatin (PRAVACHOL) 40 MG tablet Take 1 tablet by mouth once daily 90 tablet 3   promethazine-dextromethorphan (PROMETHAZINE-DM) 6.25-15 MG/5ML syrup Take 5 mLs by mouth 4 (four) times daily as needed. 118 mL 0    traZODone (DESYREL) 50 MG tablet Take 0.5-1 tablets (25-50 mg total) by mouth at bedtime as needed for sleep. 90 tablet 1   triamcinolone cream (KENALOG) 0.1 % Apply to affected area  2 (two) times daily. 100 g 0   vitamin B-12 (CYANOCOBALAMIN) 1000 MCG tablet Take 1 tablet (1,000 mcg total) by mouth daily. (Patient taking differently: Take 1,000 mcg by mouth 3 (three) times a week.) 90 tablet 3   No current facility-administered medications on file prior to visit.    Review of Systems  Constitutional:  Negative for appetite change and fever.  HENT:  Positive for congestion, ear pain, postnasal drip, sore throat and voice change. Negative for sinus pressure and sinus pain.        Glands swollen  Respiratory:  Positive for cough (dry). Negative for chest tightness, shortness of breath and wheezing.   Gastrointestinal:  Negative for diarrhea and nausea.  Musculoskeletal:  Negative for myalgias.  Neurological:  Positive for light-headedness. Negative for dizziness and headaches.       Objective:   Vitals:   05/08/23 1030  BP: 132/78  Pulse: 80  Temp: 98.2 F (36.8 C)  SpO2: 96%   BP Readings from Last 3 Encounters:  05/08/23 132/78  02/19/23 124/78  08/21/22 116/80   Wt Readings from Last 3 Encounters:  05/08/23 224 lb (101.6 kg)  02/19/23 225 lb (102.1 kg)  08/21/22 225 lb (102.1 kg)   Body mass index is 37.28 kg/m.    Physical Exam Constitutional:      General: She is not in acute distress.    Appearance: Normal appearance. She is not ill-appearing.  HENT:     Head: Normocephalic and atraumatic.     Right Ear: Tympanic membrane, ear canal and external ear normal.     Left Ear: Tympanic membrane, ear canal and external ear normal.     Nose: Congestion present.     Mouth/Throat:     Mouth: Mucous membranes are moist.     Pharynx: No oropharyngeal exudate or posterior oropharyngeal erythema.  Eyes:     Conjunctiva/sclera: Conjunctivae normal.  Cardiovascular:      Rate and Rhythm: Normal rate and regular rhythm.  Pulmonary:     Effort: Pulmonary effort is normal. No respiratory distress.     Breath sounds: Normal breath sounds. No wheezing or rales.  Musculoskeletal:     Cervical back: Neck supple. No tenderness.  Lymphadenopathy:     Cervical: No cervical adenopathy.  Skin:    General: Skin is warm and dry.  Neurological:     Mental Status: She is alert.            Assessment & Plan:    URI: Acute  Likely viral in nature COVID test here negative No indication for an antibiotic Tessalon Perles, promethazine-DM cough syrup sent to pharmacy Can take Coricidin cold products, antihistamine, cough drops Stressed rest, fluids Call if symptoms worsen or are not improving

## 2023-05-08 ENCOUNTER — Encounter: Payer: Self-pay | Admitting: Internal Medicine

## 2023-05-08 ENCOUNTER — Other Ambulatory Visit (HOSPITAL_COMMUNITY): Payer: Self-pay

## 2023-05-08 ENCOUNTER — Ambulatory Visit (INDEPENDENT_AMBULATORY_CARE_PROVIDER_SITE_OTHER): Payer: Medicaid Other | Admitting: Internal Medicine

## 2023-05-08 VITALS — BP 132/78 | HR 80 | Temp 98.2°F | Ht 65.0 in | Wt 224.0 lb

## 2023-05-08 DIAGNOSIS — J069 Acute upper respiratory infection, unspecified: Secondary | ICD-10-CM | POA: Diagnosis not present

## 2023-05-08 LAB — POC COVID19 BINAXNOW: SARS Coronavirus 2 Ag: NEGATIVE

## 2023-05-08 MED ORDER — BENZONATATE 200 MG PO CAPS
200.0000 mg | ORAL_CAPSULE | Freq: Three times a day (TID) | ORAL | 0 refills | Status: DC | PRN
  Filled 2023-05-08 (×2): qty 60, 20d supply, fill #0

## 2023-05-08 MED ORDER — PROMETHAZINE-DM 6.25-15 MG/5ML PO SYRP
5.0000 mL | ORAL_SOLUTION | Freq: Four times a day (QID) | ORAL | 0 refills | Status: DC | PRN
  Filled 2023-05-08: qty 118, 6d supply, fill #0

## 2023-05-08 NOTE — Patient Instructions (Addendum)
Medications changes include :   cough syrup, cough pills     Return if symptoms worsen or fail to improve.     Upper Respiratory Infection, Adult An upper respiratory infection (URI) is a common viral infection of the nose, throat, and upper air passages that lead to the lungs. The most common type of URI is the common cold. URIs usually get better on their own, without medical treatment. What are the causes? A URI is caused by a virus. You may catch a virus by: Breathing in droplets from an infected person's cough or sneeze. Touching something that has been exposed to the virus (is contaminated) and then touching your mouth, nose, or eyes. What increases the risk? You are more likely to get a URI if: You are very young or very old. You have close contact with others, such as at work, school, or a health care facility. You smoke. You have long-term (chronic) heart or lung disease. You have a weakened disease-fighting system (immune system). You have nasal allergies or asthma. You are experiencing a lot of stress. You have poor nutrition. What are the signs or symptoms? A URI usually involves some of the following symptoms: Runny or stuffy (congested) nose. Cough. Sneezing. Sore throat. Headache. Fatigue. Fever. Loss of appetite. Pain in your forehead, behind your eyes, and over your cheekbones (sinus pain). Muscle aches. Redness or irritation of the eyes. Pressure in the ears or face. How is this diagnosed? This condition may be diagnosed based on your medical history and symptoms, and a physical exam. Your health care provider may use a swab to take a mucus sample from your nose (nasal swab). This sample can be tested to determine what virus is causing the illness. How is this treated? URIs usually get better on their own within 7-10 days. Medicines cannot cure URIs, but your health care provider may recommend certain medicines to help relieve symptoms, such  as: Over-the-counter cold medicines. Cough suppressants. Coughing is a type of defense against infection that helps to clear the respiratory system, so take these medicines only as recommended by your health care provider. Fever-reducing medicines. Follow these instructions at home: Activity Rest as needed. If you have a fever, stay home from work or school until your fever is gone or until your health care provider says your URI cannot spread to other people (is no longer contagious). Your health care provider may have you wear a face mask to prevent your infection from spreading. Relieving symptoms Gargle with a mixture of salt and water 3-4 times a day or as needed. To make salt water, completely dissolve -1 tsp (3-6 g) of salt in 1 cup (237 mL) of warm water. Use a cool-mist humidifier to add moisture to the air. This can help you breathe more easily. Eating and drinking  Drink enough fluid to keep your urine pale yellow. Eat soups and other clear broths. General instructions  Take over-the-counter and prescription medicines only as told by your health care provider. These include cold medicines, fever reducers, and cough suppressants. Do not use any products that contain nicotine or tobacco. These products include cigarettes, chewing tobacco, and vaping devices, such as e-cigarettes. If you need help quitting, ask your health care provider. Stay away from secondhand smoke. Stay up to date on all immunizations, including the yearly (annual) flu vaccine. Keep all follow-up visits. This is important. How to prevent the spread of infection to others URIs can be contagious. To prevent the  infection from spreading: Wash your hands with soap and water for at least 20 seconds. If soap and water are not available, use hand sanitizer. Avoid touching your mouth, face, eyes, or nose. Cough or sneeze into a tissue or your sleeve or elbow instead of into your hand or into the air.  Contact a  health care provider if: You are getting worse instead of better. You have a fever or chills. Your mucus is brown or red. You have yellow or brown discharge coming from your nose. You have pain in your face, especially when you bend forward. You have swollen neck glands. You have pain while swallowing. You have white areas in the back of your throat. Get help right away if: You have shortness of breath that gets worse. You have severe or persistent: Headache. Ear pain. Sinus pain. Chest pain. You have chronic lung disease along with any of the following: Making high-pitched whistling sounds when you breathe, most often when you breathe out (wheezing). Prolonged cough (more than 14 days). Coughing up blood. A change in your usual mucus. You have a stiff neck. You have changes in your: Vision. Hearing. Thinking. Mood. These symptoms may be an emergency. Get help right away. Call 911. Do not wait to see if the symptoms will go away. Do not drive yourself to the hospital. Summary An upper respiratory infection (URI) is a common infection of the nose, throat, and upper air passages that lead to the lungs. A URI is caused by a virus. URIs usually get better on their own within 7-10 days. Medicines cannot cure URIs, but your health care provider may recommend certain medicines to help relieve symptoms. This information is not intended to replace advice given to you by your health care provider. Make sure you discuss any questions you have with your health care provider. Document Revised: 03/13/2021 Document Reviewed: 03/13/2021 Elsevier Patient Education  2024 [employer].

## 2023-05-08 NOTE — Addendum Note (Signed)
Addended by: Karma Ganja on: 05/08/2023 03:57 PM   Modules accepted: Orders

## 2023-05-11 ENCOUNTER — Encounter (INDEPENDENT_AMBULATORY_CARE_PROVIDER_SITE_OTHER): Payer: Medicaid Other | Admitting: Ophthalmology

## 2023-05-11 DIAGNOSIS — H34811 Central retinal vein occlusion, right eye, with macular edema: Secondary | ICD-10-CM

## 2023-05-11 DIAGNOSIS — I10 Essential (primary) hypertension: Secondary | ICD-10-CM | POA: Diagnosis not present

## 2023-05-11 DIAGNOSIS — H2513 Age-related nuclear cataract, bilateral: Secondary | ICD-10-CM

## 2023-05-11 DIAGNOSIS — H35033 Hypertensive retinopathy, bilateral: Secondary | ICD-10-CM

## 2023-05-11 DIAGNOSIS — H43813 Vitreous degeneration, bilateral: Secondary | ICD-10-CM

## 2023-05-14 ENCOUNTER — Other Ambulatory Visit (HOSPITAL_COMMUNITY): Payer: Self-pay

## 2023-05-14 DIAGNOSIS — M545 Low back pain, unspecified: Secondary | ICD-10-CM | POA: Diagnosis not present

## 2023-05-14 DIAGNOSIS — E559 Vitamin D deficiency, unspecified: Secondary | ICD-10-CM | POA: Diagnosis not present

## 2023-05-14 DIAGNOSIS — Z79899 Other long term (current) drug therapy: Secondary | ICD-10-CM | POA: Diagnosis not present

## 2023-05-14 DIAGNOSIS — Z6837 Body mass index (BMI) 37.0-37.9, adult: Secondary | ICD-10-CM | POA: Diagnosis not present

## 2023-05-14 DIAGNOSIS — Z131 Encounter for screening for diabetes mellitus: Secondary | ICD-10-CM | POA: Diagnosis not present

## 2023-05-14 DIAGNOSIS — E7800 Pure hypercholesterolemia, unspecified: Secondary | ICD-10-CM | POA: Diagnosis not present

## 2023-05-14 DIAGNOSIS — E6609 Other obesity due to excess calories: Secondary | ICD-10-CM | POA: Diagnosis not present

## 2023-05-14 MED ORDER — OXYCODONE-ACETAMINOPHEN 10-325 MG PO TABS
1.0000 | ORAL_TABLET | Freq: Every day | ORAL | 0 refills | Status: AC
  Filled 2023-05-14 – 2023-05-28 (×2): qty 150, 30d supply, fill #0

## 2023-05-20 ENCOUNTER — Other Ambulatory Visit: Payer: Self-pay | Admitting: Internal Medicine

## 2023-05-28 ENCOUNTER — Other Ambulatory Visit (HOSPITAL_COMMUNITY): Payer: Self-pay

## 2023-05-28 DIAGNOSIS — R399 Unspecified symptoms and signs involving the genitourinary system: Secondary | ICD-10-CM | POA: Diagnosis not present

## 2023-05-28 DIAGNOSIS — R300 Dysuria: Secondary | ICD-10-CM | POA: Diagnosis not present

## 2023-05-28 DIAGNOSIS — E6609 Other obesity due to excess calories: Secondary | ICD-10-CM | POA: Diagnosis not present

## 2023-05-28 DIAGNOSIS — M545 Low back pain, unspecified: Secondary | ICD-10-CM | POA: Diagnosis not present

## 2023-05-28 DIAGNOSIS — Z6837 Body mass index (BMI) 37.0-37.9, adult: Secondary | ICD-10-CM | POA: Diagnosis not present

## 2023-05-28 MED ORDER — SULFAMETHOXAZOLE-TRIMETHOPRIM 800-160 MG PO TABS
ORAL_TABLET | ORAL | 0 refills | Status: DC
  Filled 2023-05-28: qty 6, 3d supply, fill #0

## 2023-06-22 ENCOUNTER — Other Ambulatory Visit (HOSPITAL_COMMUNITY): Payer: Self-pay

## 2023-06-22 DIAGNOSIS — E6609 Other obesity due to excess calories: Secondary | ICD-10-CM | POA: Diagnosis not present

## 2023-06-22 DIAGNOSIS — Z79899 Other long term (current) drug therapy: Secondary | ICD-10-CM | POA: Diagnosis not present

## 2023-06-22 DIAGNOSIS — Z6837 Body mass index (BMI) 37.0-37.9, adult: Secondary | ICD-10-CM | POA: Diagnosis not present

## 2023-06-22 DIAGNOSIS — M545 Low back pain, unspecified: Secondary | ICD-10-CM | POA: Diagnosis not present

## 2023-06-22 MED ORDER — OXYCODONE-ACETAMINOPHEN 10-325 MG PO TABS
1.0000 | ORAL_TABLET | Freq: Every day | ORAL | 0 refills | Status: DC
  Filled 2023-06-22 – 2023-06-25 (×2): qty 150, 30d supply, fill #0

## 2023-06-24 DIAGNOSIS — Z79899 Other long term (current) drug therapy: Secondary | ICD-10-CM | POA: Diagnosis not present

## 2023-06-25 ENCOUNTER — Other Ambulatory Visit (HOSPITAL_COMMUNITY): Payer: Self-pay

## 2023-06-25 ENCOUNTER — Other Ambulatory Visit: Payer: Self-pay

## 2023-07-21 ENCOUNTER — Other Ambulatory Visit (HOSPITAL_COMMUNITY): Payer: Self-pay

## 2023-07-21 DIAGNOSIS — Z6838 Body mass index (BMI) 38.0-38.9, adult: Secondary | ICD-10-CM | POA: Diagnosis not present

## 2023-07-21 DIAGNOSIS — Z79899 Other long term (current) drug therapy: Secondary | ICD-10-CM | POA: Diagnosis not present

## 2023-07-21 DIAGNOSIS — M545 Low back pain, unspecified: Secondary | ICD-10-CM | POA: Diagnosis not present

## 2023-07-21 DIAGNOSIS — E6609 Other obesity due to excess calories: Secondary | ICD-10-CM | POA: Diagnosis not present

## 2023-07-21 MED ORDER — OXYCODONE-ACETAMINOPHEN 10-325 MG PO TABS
1.0000 | ORAL_TABLET | Freq: Every day | ORAL | 0 refills | Status: DC
  Filled 2023-07-21: qty 150, 30d supply, fill #0

## 2023-07-21 MED ORDER — NALOXONE HCL 4 MG/0.1ML NA LIQD
NASAL | 0 refills | Status: AC
Start: 2023-07-21 — End: ?
  Filled 2023-07-21: qty 2, 1d supply, fill #0

## 2023-07-22 ENCOUNTER — Other Ambulatory Visit (HOSPITAL_COMMUNITY): Payer: Self-pay

## 2023-08-10 ENCOUNTER — Encounter (INDEPENDENT_AMBULATORY_CARE_PROVIDER_SITE_OTHER): Payer: Medicaid Other | Admitting: Ophthalmology

## 2023-08-10 DIAGNOSIS — H34811 Central retinal vein occlusion, right eye, with macular edema: Secondary | ICD-10-CM | POA: Diagnosis not present

## 2023-08-10 DIAGNOSIS — H43813 Vitreous degeneration, bilateral: Secondary | ICD-10-CM

## 2023-08-10 DIAGNOSIS — H2513 Age-related nuclear cataract, bilateral: Secondary | ICD-10-CM | POA: Diagnosis not present

## 2023-08-10 DIAGNOSIS — H35033 Hypertensive retinopathy, bilateral: Secondary | ICD-10-CM

## 2023-08-10 DIAGNOSIS — H35372 Puckering of macula, left eye: Secondary | ICD-10-CM

## 2023-08-21 ENCOUNTER — Ambulatory Visit (INDEPENDENT_AMBULATORY_CARE_PROVIDER_SITE_OTHER): Payer: Medicaid Other | Admitting: Internal Medicine

## 2023-08-21 ENCOUNTER — Encounter: Payer: Self-pay | Admitting: Internal Medicine

## 2023-08-21 VITALS — BP 124/72 | HR 67 | Temp 98.2°F | Ht 65.0 in | Wt 230.0 lb

## 2023-08-21 DIAGNOSIS — M19042 Primary osteoarthritis, left hand: Secondary | ICD-10-CM

## 2023-08-21 DIAGNOSIS — E7800 Pure hypercholesterolemia, unspecified: Secondary | ICD-10-CM

## 2023-08-21 DIAGNOSIS — M19041 Primary osteoarthritis, right hand: Secondary | ICD-10-CM | POA: Diagnosis not present

## 2023-08-21 DIAGNOSIS — E538 Deficiency of other specified B group vitamins: Secondary | ICD-10-CM | POA: Diagnosis not present

## 2023-08-21 DIAGNOSIS — I10 Essential (primary) hypertension: Secondary | ICD-10-CM | POA: Diagnosis not present

## 2023-08-21 DIAGNOSIS — R739 Hyperglycemia, unspecified: Secondary | ICD-10-CM

## 2023-08-21 DIAGNOSIS — E559 Vitamin D deficiency, unspecified: Secondary | ICD-10-CM | POA: Diagnosis not present

## 2023-08-21 LAB — BASIC METABOLIC PANEL
BUN: 15 mg/dL (ref 6–23)
CO2: 29 meq/L (ref 19–32)
Calcium: 9.7 mg/dL (ref 8.4–10.5)
Chloride: 103 meq/L (ref 96–112)
Creatinine, Ser: 0.91 mg/dL (ref 0.40–1.20)
GFR: 69.68 mL/min (ref >60.00)
Glucose, Bld: 97 mg/dL (ref 70–99)
Potassium: 4.2 meq/L (ref 3.5–5.1)
Sodium: 139 meq/L (ref 135–145)

## 2023-08-21 LAB — HEPATIC FUNCTION PANEL
ALT: 24 U/L (ref 0–35)
AST: 25 U/L (ref 0–37)
Albumin: 4.3 g/dL (ref 3.5–5.2)
Alkaline Phosphatase: 54 U/L (ref 39–117)
Bilirubin, Direct: 0.1 mg/dL (ref 0.0–0.3)
Total Bilirubin: 0.6 mg/dL (ref 0.2–1.2)
Total Protein: 7.1 g/dL (ref 6.0–8.3)

## 2023-08-21 LAB — VITAMIN D 25 HYDROXY (VIT D DEFICIENCY, FRACTURES): VITD: 49.55 ng/mL (ref 30.00–100.00)

## 2023-08-21 LAB — LIPID PANEL
Cholesterol: 171 mg/dL (ref 0–200)
HDL: 53.2 mg/dL (ref >39.00)
LDL Cholesterol: 98 mg/dL (ref 0–99)
NonHDL: 117.84
Total CHOL/HDL Ratio: 3
Triglycerides: 97 mg/dL (ref 0.0–149.0)
VLDL: 19.4 mg/dL (ref 0.0–40.0)

## 2023-08-21 LAB — VITAMIN B12: Vitamin B-12: 548 pg/mL (ref 211–911)

## 2023-08-21 LAB — HEMOGLOBIN A1C: Hgb A1c MFr Bld: 5.9 % (ref 4.6–6.5)

## 2023-08-21 NOTE — Progress Notes (Unsigned)
Patient ID: Pamela Gibson, female   DOB: [birth year], 57 y.o.   MRN: [id number]        Chief Complaint: follow up HTN, HLD and hyperglycemia ***       HPI:  Pamela Gibson is a 57 y.o. female here with c/o        Wt Readings from Last 3 Encounters:  08/21/23 230 lb (104.3 kg)  05/08/23 224 lb (101.6 kg)  02/19/23 225 lb (102.1 kg)   BP Readings from Last 3 Encounters:  08/21/23 124/72  05/08/23 132/78  02/19/23 124/78         Past Medical History:  Diagnosis Date   Anxiety    BACK PAIN 07/23/2007   Barrett's esophagus    CHEST PAIN 09/04/2010   Colon adenoma    Cyst of right ovary 07/03/2015   DEGENERATIVE JOINT DISEASE, LUMBAR SPINE 07/23/2007   Depression    Gastric polyp    x3   GERD 07/22/2007   H/O total hysterectomy    HYPERLIPIDEMIA 07/22/2007   IBS 07/23/2007   MITRAL VALVE PROLAPSE 07/22/2007   Pinched nerve    LEFT SIDE, DISC 3,4,5   Pneumonia    YEARS AGO   SINUSITIS- ACUTE-NOS 09/09/2007   TACHYCARDIA 08/20/2010   Tuberculosis     MOTHER HAD YEARS AGO   Unspecified Vaginitis and Vulvovaginitis 12/17/2007   Past Surgical History:  Procedure Laterality Date   ABDOMINAL HYSTERECTOMY     BIOPSY  04/17/2022   Procedure: BIOPSY;  Surgeon: Imogene Burn, MD;  Location: [facility];  Service: Gastroenterology;;   CHOLECYSTECTOMY     COLONOSCOPY WITH PROPOFOL N/A 04/17/2022   Procedure: COLONOSCOPY WITH PROPOFOL;  Surgeon: Imogene Burn, MD;  Location: [facility];  Service: Gastroenterology;  Laterality: N/A;   CYSTOSCOPY  11/11/2017   Procedure: CYSTOSCOPY;  Surgeon: Myna Hidalgo, DO;  Location: [facility];  Service: Gynecology;;   ESOPHAGOGASTRODUODENOSCOPY (EGD) WITH PROPOFOL N/A 04/17/2022   Procedure: ESOPHAGOGASTRODUODENOSCOPY (EGD) WITH PROPOFOL;  Surgeon: Imogene Burn, MD;  Location: [facility];  Service: Gastroenterology;  Laterality: N/A;   LAPAROSCOPIC BILATERAL SALPINGO OOPHERECTOMY Bilateral 11/11/2017   Procedure: LAPAROSCOPIC BILATERAL  SALPINGO OOPHORECTOMY;  Surgeon: Myna Hidalgo, DO;  Location: [facility];  Service: Gynecology;  Laterality: Bilateral;   POLYPECTOMY  04/17/2022   Procedure: POLYPECTOMY;  Surgeon: Imogene Burn, MD;  Location: [facility];  Service: Gastroenterology;;   RLE tib/fib fx/surgury     TONSILLECTOMY     TUBAL LIGATION      reports that she has never smoked. She has never used smokeless tobacco. She reports current alcohol use. She reports that she does not use drugs. family history includes Angina in her mother; Colon polyps in her mother; Coronary artery disease in her mother; Diabetes in an other family member; Heart disease in her mother; Hyperlipidemia in an other family member; Hypertension in an other family member; Stroke in her mother. Allergies  Allergen Reactions   Atorvastatin     REACTION: more forgetful   Ibuprofen     INSTRUCTED BY MD NOT TO TAKE DUE TO GI UPSET   Morphine Sulfate Er Other (See Comments)    Chest hurt   Penicillin G Itching   Penicillins     turns blue, childhood allergy Has patient had a PCN reaction causing immediate rash, facial/tongue/throat swelling, SOB or lightheadedness with hypotension: Yes Has patient had a PCN reaction causing severe rash involving mucus membranes or skin necrosis: No Has patient had a PCN reaction  that required hospitalization: Unknown  Has patient had a PCN reaction occurring within the last 10 years: No If all of the above answers are "NO", then may proceed with Cephalosporin use.    Simvastatin     REACTION: more forgetful   Tetracycline Itching   Tetracycline Hcl Itching   Current Outpatient Medications on File Prior to Visit  Medication Sig Dispense Refill   ALPRAZolam (XANAX) 1 MG tablet Take 1 tablet by mouth three times daily as needed 90 tablet 2   aspirin 81 MG EC tablet Take 81 mg by mouth every other day.     benzonatate (TESSALON) 200 MG capsule Take 1 capsule (200 mg total) by mouth 3 (three) times daily as  needed. 60 capsule 0   calcium carbonate (TUMS - DOSED IN MG ELEMENTAL CALCIUM) 500 MG chewable tablet Chew 1-2 tablets by mouth daily as needed for indigestion or heartburn.     celecoxib (CELEBREX) 200 MG capsule Take 200 mg by mouth daily as needed.     Cholecalciferol (VITAMIN D3) 25 MCG (1000 UT) CAPS Take 2,000 Units by mouth daily.     [employer] PURELAX 17 GM/SCOOP powder Take 0.5 Containers by mouth daily as needed for mild constipation or moderate constipation.     gabapentin (NEURONTIN) 100 MG capsule Take 1 capsule (100 mg total) by mouth 3 (three) times daily. 270 capsule 1   lidocaine (LIDODERM) 5 % Place 1-3 patches onto skin as directed. (Patient taking differently: Place 1 patch onto the skin daily as needed (pain).) 270 patch 0   lidocaine (LIDODERM) 5 % Place 1 - 3 patches onto the skin as directed. Patches may be worn for up to 12 hours and must be off for 12 hours between uses. 270 patch 0   losartan (COZAAR) 25 MG tablet TAKE 1 TABLET BY MOUTH ONCE DAILY. 90 tablet 3   Multiple Vitamins-Minerals (MULTIVITAMIN WITH MINERALS) tablet Take 1 tablet by mouth daily.     naloxone (NARCAN) nasal spray 4 mg/0.1 mL Use  1 spray every 3 minutes; spray 1 dose into ONE nostril; alternate nostrils w each dose until help arrives 2 each 0   naloxone (NARCAN) nasal spray 4 mg/0.1 mL Spray 1 spray every 3 minutes; spray into ONE nostril; alternate nostrils w each dose until help arrives. 2 each 0   naloxone (NARCAN) nasal spray 4 mg/0.1 mL Spray 1 dose into ONE nostril every 3 minutes; alternate nostrils with each dose until help arrives 2 each 0   NARCAN 4 MG/0.1ML LIQD nasal spray kit Place 1 spray into the nose once.     oxyCODONE-acetaminophen (PERCOCET) 10-325 MG tablet Take 1 tablet by mouth every four hours as needed for pain 180 tablet 0   oxyCODONE-acetaminophen (PERCOCET) 10-325 MG tablet Take 1 tablet by mouth every 4 (four) hours as needed for pain. 180 tablet 0   oxyCODONE-acetaminophen  (PERCOCET) 10-325 MG tablet Take 1 tablet by mouth every 4 (four) hours as needed. 42 tablet 0   oxyCODONE-acetaminophen (PERCOCET) 10-325 MG tablet Take 1 tablet by mouth every 4 hours as needed 180 tablet 0   oxyCODONE-acetaminophen (PERCOCET) 10-325 MG tablet Take 1 tablet by mouth every 4 hours as needed. 180 tablet 0   oxyCODONE-acetaminophen (PERCOCET) 10-325 MG tablet Take 1 tablet by mouth five times a day. 150 tablet 0   oxyCODONE-acetaminophen (PERCOCET) 10-325 MG tablet Take 1 tablet by mouth 5 times daily. 150 tablet 0   oxyCODONE-acetaminophen (PERCOCET) 10-325 MG tablet Take 1  tablet by mouth 5 (five) times daily. 150 tablet 0   oxyCODONE-acetaminophen (PERCOCET) 10-325 MG tablet Take 1 tablet by mouth 5 (five) times daily. 150 tablet 0   oxyCODONE-acetaminophen (PERCOCET) 10-325 MG tablet Take 1 tablet by mouth 5 (five) times daily. 150 tablet 0   pantoprazole (PROTONIX) 40 MG tablet Take 1 tablet (40 mg total) by mouth daily. 90 tablet 3   pravastatin (PRAVACHOL) 40 MG tablet Take 1 tablet by mouth once daily 90 tablet 3   promethazine-dextromethorphan (PROMETHAZINE-DM) 6.25-15 MG/5ML syrup Take 5 mLs by mouth 4 (four) times daily as needed. 118 mL 0   sulfamethoxazole-trimethoprim (BACTRIM DS) 800-160 MG tablet Take 1 tablet by mouth 2 times daily 6 tablet 0   traZODone (DESYREL) 50 MG tablet Take 0.5-1 tablets (25-50 mg total) by mouth at bedtime as needed for sleep. 90 tablet 1   triamcinolone cream (KENALOG) 0.1 % Apply to affected area  2 (two) times daily. 100 g 0   vitamin B-12 (CYANOCOBALAMIN) 1000 MCG tablet Take 1 tablet (1,000 mcg total) by mouth daily. (Patient taking differently: Take 1,000 mcg by mouth 3 (three) times a week.) 90 tablet 3   No current facility-administered medications on file prior to visit.        ROS:  All others reviewed and negative.  Objective        PE:  BP 124/72 (BP Location: Right Arm, Patient Position: Sitting, Cuff Size: Normal)   Pulse  67   Temp 98.2 F (36.8 C) (Oral)   Ht 5\' 5"  (1.651 m)   Wt 230 lb (104.3 kg)   SpO2 96%   BMI 38.27 kg/m                 Constitutional: Pt appears in NAD               HENT: Head: NCAT.                Right Ear: External ear normal.                 Left Ear: External ear normal.                Eyes: . Pupils are equal, round, and reactive to light. Conjunctivae and EOM are normal               Nose: without d/c or deformity               Neck: Neck supple. Gross normal ROM               Cardiovascular: Normal rate and regular rhythm.                 Pulmonary/Chest: Effort normal and breath sounds without rales or wheezing.                Abd:  Soft, NT, ND, + BS, no organomegaly               Neurological: Pt is alert. At baseline orientation, motor grossly intact               Skin: Skin is warm. No rashes, no other new lesions, LE edema - ***               Psychiatric: Pt behavior is normal without agitation   Micro: none  Cardiac tracings I have personally interpreted today:  none  Pertinent Radiological findings (summarize): none   Lab Results  Component Value Date   WBC 7.5 02/19/2023   HGB 15.4 (H) 02/19/2023   HCT 46.9 (H) 02/19/2023   PLT 257.0 02/19/2023   GLUCOSE 92 02/19/2023   CHOL 171 02/19/2023   TRIG 111.0 02/19/2023   HDL 50.70 02/19/2023   LDLDIRECT 128.8 07/18/2008   LDLCALC 98 02/19/2023   ALT 21 02/19/2023   AST 21 02/19/2023   NA 139 02/19/2023   K 4.2 02/19/2023   CL 101 02/19/2023   CREATININE 0.98 02/19/2023   BUN 16 02/19/2023   CO2 29 02/19/2023   TSH 1.76 02/19/2023   HGBA1C 5.7 02/19/2023   MICROALBUR <0.7 02/19/2023   Assessment/Plan:  Pamela Gibson is a 57 y.o. American [place] or [place] Native [3] female with  has a past medical history of Anxiety, BACK PAIN (07/23/2007), Barrett's esophagus, CHEST PAIN (09/04/2010), Colon adenoma, Cyst of right ovary (07/03/2015), DEGENERATIVE JOINT DISEASE, LUMBAR SPINE (07/23/2007),  Depression, Gastric polyp, GERD (07/22/2007), H/O total hysterectomy, HYPERLIPIDEMIA (07/22/2007), IBS (07/23/2007), MITRAL VALVE PROLAPSE (07/22/2007), Pinched nerve, Pneumonia, SINUSITIS- ACUTE-NOS (09/09/2007), TACHYCARDIA (08/20/2010), Tuberculosis, and Unspecified Vaginitis and Vulvovaginitis (12/17/2007).  No problem-specific Assessment & Plan notes found for this encounter.  Followup: No follow-ups on file.  Oliver Barre, MD 08/21/2023 8:33 AM [facility]

## 2023-08-21 NOTE — Progress Notes (Signed)
The test results show that your current treatment is OK, as the tests are stable.  Please continue the same plan.  There is no other need for change of treatment or further evaluation based on these results, at this time.  thanks 

## 2023-08-21 NOTE — Patient Instructions (Signed)

## 2023-08-24 ENCOUNTER — Other Ambulatory Visit (HOSPITAL_COMMUNITY): Payer: Self-pay

## 2023-08-24 DIAGNOSIS — Z79899 Other long term (current) drug therapy: Secondary | ICD-10-CM | POA: Diagnosis not present

## 2023-08-24 DIAGNOSIS — M545 Low back pain, unspecified: Secondary | ICD-10-CM | POA: Diagnosis not present

## 2023-08-24 DIAGNOSIS — R030 Elevated blood-pressure reading, without diagnosis of hypertension: Secondary | ICD-10-CM | POA: Diagnosis not present

## 2023-08-24 DIAGNOSIS — E6609 Other obesity due to excess calories: Secondary | ICD-10-CM | POA: Diagnosis not present

## 2023-08-24 DIAGNOSIS — Z6838 Body mass index (BMI) 38.0-38.9, adult: Secondary | ICD-10-CM | POA: Diagnosis not present

## 2023-08-24 MED ORDER — OXYCODONE-ACETAMINOPHEN 10-325 MG PO TABS
1.0000 | ORAL_TABLET | Freq: Every day | ORAL | 0 refills | Status: DC
  Filled 2023-08-24: qty 150, 30d supply, fill #0

## 2023-08-24 MED ORDER — NALOXONE HCL 4 MG/0.1ML NA LIQD
NASAL | 0 refills | Status: AC
  Filled 2023-08-24: qty 2, 1d supply, fill #0

## 2023-08-25 ENCOUNTER — Other Ambulatory Visit (HOSPITAL_COMMUNITY): Payer: Self-pay

## 2023-08-25 ENCOUNTER — Encounter: Payer: Self-pay | Admitting: Internal Medicine

## 2023-08-25 NOTE — Assessment & Plan Note (Signed)
 Lab Results  Component Value Date   VITAMINB12 548 08/21/2023   Stable, cont oral replacement - b12 1000 mcg qd

## 2023-08-25 NOTE — Assessment & Plan Note (Signed)
 Lab Results  Component Value Date   HGBA1C 5.9 08/21/2023   Stable, pt to continue current medical treatment  - diet, wt control

## 2023-08-25 NOTE — Assessment & Plan Note (Signed)
 BP Readings from Last 3 Encounters:  08/21/23 124/72  05/08/23 132/78  02/19/23 124/78   Stable, pt to continue medical treatment losartan 25 qd

## 2023-08-25 NOTE — Assessment & Plan Note (Signed)
D/w pt - for otc volt gel prn

## 2023-08-25 NOTE — Assessment & Plan Note (Signed)
 Lab Results  Component Value Date   LDLCALC 98 08/21/2023   Uncontrolled, goal ldl < 70,, pt to continue current statin pravachll 40 mg every day, and lower chol diet, declines other change today

## 2023-08-25 NOTE — Assessment & Plan Note (Signed)
 Last vitamin D Lab Results  Component Value Date   VD25OH 49.55 08/21/2023   Stable, cont oral replacement

## 2023-09-04 ENCOUNTER — Ambulatory Visit: Payer: Self-pay | Admitting: Internal Medicine

## 2023-09-04 ENCOUNTER — Ambulatory Visit (HOSPITAL_COMMUNITY): Payer: Self-pay

## 2023-09-04 ENCOUNTER — Other Ambulatory Visit: Payer: Self-pay

## 2023-09-04 ENCOUNTER — Other Ambulatory Visit: Payer: Self-pay | Admitting: Internal Medicine

## 2023-09-04 ENCOUNTER — Other Ambulatory Visit (HOSPITAL_COMMUNITY): Payer: Self-pay

## 2023-09-04 MED ORDER — TRIAMCINOLONE ACETONIDE 0.1 % EX CREA
1.0000 | TOPICAL_CREAM | Freq: Two times a day (BID) | CUTANEOUS | 0 refills | Status: AC
  Filled 2023-09-04: qty 90, 28d supply, fill #0
  Filled 2023-09-23: qty 80, 28d supply, fill #0
  Filled 2024-01-15: qty 15, 8d supply, fill #1

## 2023-09-04 NOTE — Telephone Encounter (Signed)
 Copied from CRM [id number]. Topic: Clinical - Red Word Triage >> Sep 04, 2023 11:09 AM Robinson H wrote: [facility] that prompted transfer to Nurse Triage: Patient states she's has a cough, runny nose, sore throat and chest hurts when she coughs  Chief Complaint: Cold/flu symptoms Symptoms: Cough, ear ache, sore throat, congestion Frequency: 2 days Pertinent Negatives: Patient denies fever and SOB Disposition: [] ED /[x] Urgent Care (no appt availability in office) / [] Appointment(In office/virtual)/ []  [facility]/ [] Home Care/ [] Refused Recommended Disposition /[] [facility]/ []  Follow-up with PCP Additional Notes: Patient called in to report a variety of cold/flu symptoms. Patient reported dry cough, ear ache, congestion, sore throat, chest pain when coughing, and swollen glands. Patient denied fever and SOB at this time. Advised patient to see provider within 24 hours. No availability at current PCP office. No availability at another office within reasonable driving distance. Patient also reported that she is taking her husband and grand baby to their own appointments today, so her availability is limited. This RN scheduled an UC visit for this evening, in order for patient to be seen by a provider today. Advised patient to continue home remedies in the meantime and to call back if symptoms worsen. Patient complied.   Reason for Disposition  Earache is present  Answer Assessment - Initial Assessment Questions 1. ONSET: When did the cough begin?      2 days ago  2. SEVERITY: How bad is the cough today?      Patient states cough is not terrible, but she has been trying to suppress cough while taking care of her grand baby  3. SPUTUM: Describe the color of your sputum (none, dry cough; clear, white, yellow, green)     Denies  4. HEMOPTYSIS: Are you coughing up any blood? If so ask: How much? (flecks, streaks, tablespoons, etc.)     Denies  5. DIFFICULTY  BREATHING: Are you having difficulty breathing? If Yes, ask: How bad is it? (e.g., mild, moderate, severe)    - MILD: No SOB at rest, mild SOB with walking, speaks normally in sentences, can lie down, no retractions, pulse < 100.    - MODERATE: SOB at rest, SOB with minimal exertion and prefers to sit, cannot lie down flat, speaks in phrases, mild retractions, audible wheezing, pulse 100-120.    - SEVERE: Very SOB at rest, speaks in single words, struggling to breathe, sitting hunched forward, retractions, pulse > 120      Denies  6. FEVER: Do you have a fever? If Yes, ask: What is your temperature, how was it measured, and when did it start?     Denies   7. CARDIAC HISTORY: Do you have any history of heart disease? (e.g., heart attack, congestive heart failure)      Denies  8. LUNG HISTORY: Do you have any history of lung disease?  (e.g., pulmonary embolus, asthma, emphysema)     Denies  10. OTHER SYMPTOMS: Do you have any other symptoms? (e.g., runny nose, wheezing, chest pain)       Nasal drainage, swollen glands, ear pain, sore throat, congestion, chest pain when coughing, denies sinus pain, denies SOB, but chest feels heavy  Protocols used: Cough - Acute Non-Productive-A-AH

## 2023-09-06 ENCOUNTER — Ambulatory Visit
Admission: EM | Admit: 2023-09-06 | Discharge: 2023-09-06 | Disposition: A | Discharge status: Home / Self Care | Payer: Medicaid Other | Attending: Physician Assistant | Admitting: Physician Assistant

## 2023-09-06 ENCOUNTER — Encounter: Payer: Self-pay | Admitting: *Deleted

## 2023-09-06 ENCOUNTER — Other Ambulatory Visit: Payer: Self-pay

## 2023-09-06 DIAGNOSIS — R051 Acute cough: Secondary | ICD-10-CM | POA: Diagnosis not present

## 2023-09-06 DIAGNOSIS — J019 Acute sinusitis, unspecified: Secondary | ICD-10-CM | POA: Diagnosis not present

## 2023-09-06 LAB — POCT RAPID STREP A (OFFICE): Rapid Strep A Screen: NEGATIVE

## 2023-09-06 MED ORDER — ACETAMINOPHEN 325 MG PO TABS
650.0000 mg | ORAL_TABLET | Freq: Once | ORAL | Status: AC
Start: 2023-09-06 — End: 2023-09-06
  Administered 2023-09-06: 650 mg via ORAL

## 2023-09-06 MED ORDER — AZITHROMYCIN 250 MG PO TABS: ORAL_TABLET | ORAL | 0 refills | Status: DC

## 2023-09-06 MED ORDER — BENZONATATE 100 MG PO CAPS: 100.0000 mg | ORAL_CAPSULE | Freq: Three times a day (TID) | ORAL | 0 refills | Status: DC

## 2023-09-06 NOTE — ED Provider Notes (Signed)
 [name]    CSN: [id number] Arrival date & time: 09/06/23  1040      History   Chief Complaint Chief Complaint  Patient presents with   Otalgia   Cough    HPI Pamela Gibson is a 58 y.o. female.   Pt complains of cough, congestion, ear pain/pressure, facial pressure.  Reports feeling some scratchy throat about 1 week ago but symptoms of cough congestion ear pain and pressure started 5 days ago.  Reports fever of 101 last night reduction of fever with Tylenol ..  She has tried robitussin with some relief of cough.  She denies wheezing or shortness of breath.  Husband is sick with similar sx.  Reports cough gets worse at night.     Past Medical History:  Diagnosis Date   Anxiety    BACK PAIN 07/23/2007   Barrett's esophagus    CHEST PAIN 09/04/2010   Colon adenoma    Cyst of right ovary 07/03/2015   DEGENERATIVE JOINT DISEASE, LUMBAR SPINE 07/23/2007   Depression    Gastric polyp    x3   GERD 07/22/2007   H/O total hysterectomy    HYPERLIPIDEMIA 07/22/2007   IBS 07/23/2007   MITRAL VALVE PROLAPSE 07/22/2007   Pinched nerve    LEFT SIDE, DISC 3,4,5   Pneumonia    YEARS AGO   SINUSITIS- ACUTE-NOS 09/09/2007   TACHYCARDIA 08/20/2010   Tuberculosis     MOTHER HAD YEARS AGO   Unspecified Vaginitis and Vulvovaginitis 12/17/2007    Patient Active Problem List   Diagnosis Date Noted   Skin tag 02/19/2023   Headache 09/07/2021   HTN (hypertension) 09/07/2021   Acute cystitis with hematuria 07/17/2021   Bursitis of right hip 06/15/2021   Urinary frequency 06/14/2021   Paresthesias in right hand 04/24/2021   Chronic pain syndrome 02/13/2021   History of hysterectomy 02/13/2021   Menopausal flushing 02/13/2021   Mass of uterine adnexa 02/13/2021   Obesity 02/13/2021   Pain in rectum 02/13/2021   Vasomotor symptoms due to menopause 02/13/2021   Aortic atherosclerosis (HCC) 02/13/2021   Vitamin B12 deficiency 08/10/2019   Vitamin D  deficiency  08/10/2019   Chest pain 04/12/2019   Hypersomnolence 02/08/2019   Hyperglycemia 02/08/2019   Cloudy urine 08/05/2018   Right flank pain 05/04/2018   Dysuria 05/04/2018   Polycythemia 02/05/2018   Hot flashes 02/05/2018   Microhematuria 01/09/2017   Insomnia 01/09/2017   Disorder of function of stomach 09/06/2016   Arthritis of hand, degenerative 07/03/2016   Cyst of right ovary 07/03/2015   Abnormal urine 12/27/2014   AKI (acute kidney injury) (HCC) 12/27/2014   Hand pain 06/15/2013   Chronic low back pain 06/15/2013   Lumbar disc disease 06/15/2013   Abnormal liver function test 10/27/2012   Allergic rhinitis 02/13/2012   Vertigo 02/13/2012   Anxiety 12/08/2011   Multiple gastric polyps 12/11/2010   Adenomatous colon polyp 12/11/2010   Encounter for well adult exam with abnormal findings 12/08/2010   IBS 07/23/2007   DEGENERATIVE JOINT DISEASE, LUMBAR SPINE 07/23/2007   Hyperlipidemia 07/22/2007   MITRAL VALVE PROLAPSE 07/22/2007   GERD 07/22/2007   Mitral valve disease 07/22/2007    Past Surgical History:  Procedure Laterality Date   ABDOMINAL HYSTERECTOMY     BIOPSY  04/17/2022   Procedure: BIOPSY;  Surgeon: Federico Rosario BROCKS, MD;  Location: [facility];  Service: Gastroenterology;;   CHOLECYSTECTOMY     COLONOSCOPY WITH PROPOFOL  N/A 04/17/2022   Procedure: COLONOSCOPY WITH PROPOFOL ;  Surgeon: Federico Rosario BROCKS, MD;  Location: [facility];  Service: Gastroenterology;  Laterality: N/A;   CYSTOSCOPY  11/11/2017   Procedure: CYSTOSCOPY;  Surgeon: Marilynn Nest, DO;  Location: [facility];  Service: Gynecology;;   ESOPHAGOGASTRODUODENOSCOPY (EGD) WITH PROPOFOL  N/A 04/17/2022   Procedure: ESOPHAGOGASTRODUODENOSCOPY (EGD) WITH PROPOFOL ;  Surgeon: Federico Rosario BROCKS, MD;  Location: [facility];  Service: Gastroenterology;  Laterality: N/A;   LAPAROSCOPIC BILATERAL SALPINGO OOPHERECTOMY Bilateral 11/11/2017   Procedure: LAPAROSCOPIC BILATERAL SALPINGO OOPHORECTOMY;  Surgeon: Ozan, Jennifer,  DO;  Location: [facility];  Service: Gynecology;  Laterality: Bilateral;   POLYPECTOMY  04/17/2022   Procedure: POLYPECTOMY;  Surgeon: Federico Rosario BROCKS, MD;  Location: [facility];  Service: Gastroenterology;;   RLE tib/fib fx/surgury     TONSILLECTOMY     TUBAL LIGATION      OB History   No obstetric history on file.      Home Medications    Prior to Admission medications   Medication Sig Start Date End Date Taking? Authorizing Provider  ALPRAZolam  (XANAX ) 1 MG tablet Take 1 tablet by mouth three times daily as needed 05/21/23  Yes Norleen Lynwood ORN, MD  aspirin 81 MG EC tablet Take 81 mg by mouth every other day.   Yes [provider]  azithromycin  (ZITHROMAX  Z-PAK) 250 MG tablet Take 2 tablets by mouth on the first day and then 1 tablet by mouth for remaining days. 09/06/23  Yes Ward, Harlene PEDLAR, PA-C  benzonatate  (TESSALON ) 100 MG capsule Take 1 capsule (100 mg total) by mouth every 8 (eight) hours. 09/06/23  Yes Ward, Haydon Kalmar Z, PA-C  calcium carbonate (TUMS - DOSED IN MG ELEMENTAL CALCIUM) 500 MG chewable tablet Chew 1-2 tablets by mouth daily as needed for indigestion or heartburn.   Yes [provider]  Cholecalciferol (VITAMIN D3) 25 MCG (1000 UT) CAPS Take 2,000 Units by mouth daily.   Yes [provider]  losartan  (COZAAR ) 25 MG tablet TAKE 1 TABLET BY MOUTH ONCE DAILY. 11/10/22  Yes Norleen Lynwood ORN, MD  Multiple Vitamins-Minerals (MULTIVITAMIN WITH MINERALS) tablet Take 1 tablet by mouth daily.   Yes [provider]  naloxone  (NARCAN ) nasal spray 4 mg/0.1 mL 1 spray every 3 minutes; spray 1 dose into ONE nostril; alternate nostrils w each dose until help arrives 08/24/23  Yes   oxyCODONE -acetaminophen  (PERCOCET) 10-325 MG tablet Take 1 tablet by mouth 5 (five) times daily. 08/24/23  Yes   pantoprazole  (PROTONIX ) 40 MG tablet Take 1 tablet (40 mg total) by mouth daily. 02/19/23  Yes Norleen Lynwood ORN, MD  pravastatin  (PRAVACHOL ) 40 MG tablet Take 1 tablet by  mouth once daily 04/24/23  Yes Norleen Lynwood ORN, MD  triamcinolone  cream (KENALOG ) 0.1 % Apply to affected area  2 (two) times daily. 09/04/23  Yes Rollene Almarie LABOR, MD  vitamin B-12 (CYANOCOBALAMIN ) 1000 MCG tablet Take 1 tablet (1,000 mcg total) by mouth daily. Patient taking differently: Take 1,000 mcg by mouth 3 (three) times a week. 08/10/19  Yes Norleen Lynwood ORN, MD  celecoxib (CELEBREX) 200 MG capsule Take 200 mg by mouth daily as needed. Patient not taking: Reported on 09/06/2023 04/16/23   [provider]  [employer] PURELAX 17 GM/SCOOP powder Take 0.5 Containers by mouth daily as needed for mild constipation or moderate constipation. 05/20/20   [provider]  gabapentin  (NEURONTIN ) 100 MG capsule Take 1 capsule (100 mg total) by mouth 3 (three) times daily. Patient not taking: Reported on 09/06/2023 09/04/21   Norleen Lynwood ORN,  MD  lidocaine  (LIDODERM ) 5 % Place 1-3 patches onto skin as directed. Patient not taking: Reported on 09/06/2023 04/02/22     lidocaine  (LIDODERM ) 5 % Place 1 - 3 patches onto the skin as directed. Patches may be worn for up to 12 hours and must be off for 12 hours between uses. Patient not taking: Reported on 09/06/2023 07/25/22     naloxone  (NARCAN ) nasal spray 4 mg/0.1 mL Use  1 spray every 3 minutes; spray 1 dose into ONE nostril; alternate nostrils w each dose until help arrives 11/25/22     naloxone  (NARCAN ) nasal spray 4 mg/0.1 mL Spray 1 spray every 3 minutes; spray into ONE nostril; alternate nostrils w each dose until help arrives. 12/23/22     naloxone  (NARCAN ) nasal spray 4 mg/0.1 mL Spray 1 dose into ONE nostril every 3 minutes; alternate nostrils with each dose until help arrives 07/21/23     NARCAN  4 MG/0.1ML LIQD nasal spray kit Place 1 spray into the nose once. 10/10/20   [provider]  oxyCODONE -acetaminophen  (PERCOCET) 10-325 MG tablet Take 1 tablet by mouth every four hours as needed for pain 04/02/22     oxyCODONE -acetaminophen  (PERCOCET)  10-325 MG tablet Take 1 tablet by mouth every 4 (four) hours as needed for pain. 07/01/22     oxyCODONE -acetaminophen  (PERCOCET) 10-325 MG tablet Take 1 tablet by mouth every 4 (four) hours as needed. 09/18/22     oxyCODONE -acetaminophen  (PERCOCET) 10-325 MG tablet Take 1 tablet by mouth every 4 hours as needed 09/25/22     oxyCODONE -acetaminophen  (PERCOCET) 10-325 MG tablet Take 1 tablet by mouth every 4 hours as needed. 01/20/23     oxyCODONE -acetaminophen  (PERCOCET) 10-325 MG tablet Take 1 tablet by mouth five times a day. 02/20/23     oxyCODONE -acetaminophen  (PERCOCET) 10-325 MG tablet Take 1 tablet by mouth 5 times daily. 03/20/23     oxyCODONE -acetaminophen  (PERCOCET) 10-325 MG tablet Take 1 tablet by mouth 5 (five) times daily. 04/17/23     oxyCODONE -acetaminophen  (PERCOCET) 10-325 MG tablet Take 1 tablet by mouth 5 (five) times daily. 05/14/23     promethazine -dextromethorphan (PROMETHAZINE -DM) 6.25-15 MG/5ML syrup Take 5 mLs by mouth 4 (four) times daily as needed. Patient not taking: Reported on 09/06/2023 05/08/23   Geofm Glade PARAS, MD  sulfamethoxazole -trimethoprim  (BACTRIM  DS) 800-160 MG tablet Take 1 tablet by mouth 2 times daily Patient not taking: Reported on 09/06/2023 05/28/23     traZODone  (DESYREL ) 50 MG tablet Take 0.5-1 tablets (25-50 mg total) by mouth at bedtime as needed for sleep. Patient not taking: Reported on 09/06/2023 09/04/21   Norleen Lynwood ORN, MD    Family History Family History  Problem Relation Age of Onset   Colon polyps Mother    Angina Mother    Heart disease Mother        CVA   Coronary artery disease Mother    Stroke Mother    Hypertension Other    Diabetes Other    Hyperlipidemia Other    Breast cancer Neg Hx    Esophageal cancer Neg Hx    Colon cancer Neg Hx    Pancreatic cancer Neg Hx    Stomach cancer Neg Hx    Liver disease Neg Hx     Social History Social History   Tobacco Use   Smoking status: Never   Smokeless tobacco: Never  Vaping Use    Vaping status: Never Used  Substance Use Topics   Alcohol use: Yes    Comment: occasional  Drug use: No     Allergies   Atorvastatin, Ibuprofen, Morphine  sulfate er, Penicillin g, Penicillins, Simvastatin, Tetracycline, and Tetracycline hcl   Review of Systems Review of Systems  Constitutional:  Positive for fever. Negative for chills.  HENT:  Positive for congestion and sore throat. Negative for ear pain.   Eyes:  Positive for pain. Negative for visual disturbance.  Respiratory:  Positive for cough. Negative for shortness of breath.   Cardiovascular:  Negative for chest pain and palpitations.  Gastrointestinal:  Negative for abdominal pain and vomiting.  Genitourinary:  Negative for dysuria and hematuria.  Musculoskeletal:  Negative for arthralgias and back pain.  Skin:  Negative for color change and rash.  Neurological:  Negative for seizures and syncope.  All other systems reviewed and are negative.    Physical Exam Triage Vital Signs ED Triage Vitals  Encounter Vitals Group     BP 09/06/23 1134 126/85     Systolic BP Percentile --      Diastolic BP Percentile --      Pulse Rate 09/06/23 1134 (!) 111     Resp 09/06/23 1134 12     Temp 09/06/23 1134 100.1 F (37.8 C)     Temp Source 09/06/23 1134 Oral     SpO2 09/06/23 1134 94 %     Weight --      Height --      Head Circumference --      Peak Flow --      Pain Score 09/06/23 1128 6     Pain Loc --      Pain Education --      Exclude from Growth Chart --    No data found.  Updated Vital Signs BP 126/85 (BP Location: Left Arm)   Pulse (!) 111   Temp 100.1 F (37.8 C) (Oral)   Resp 12   SpO2 94%   Visual Acuity Right Eye Distance:   Left Eye Distance:   Bilateral Distance:    Right Eye Near:   Left Eye Near:    Bilateral Near:     Physical Exam Vitals and nursing note reviewed.  Constitutional:      General: She is not in acute distress.    Appearance: She is well-developed.  HENT:     Head:  Normocephalic and atraumatic.  Eyes:     Conjunctiva/sclera: Conjunctivae normal.  Cardiovascular:     Rate and Rhythm: Normal rate and regular rhythm.     Heart sounds: No murmur heard. Pulmonary:     Effort: Pulmonary effort is normal. No respiratory distress.     Breath sounds: Normal breath sounds.  Abdominal:     Palpations: Abdomen is soft.     Tenderness: There is no abdominal tenderness.  Musculoskeletal:        General: No swelling.     Cervical back: Neck supple.  Skin:    General: Skin is warm and dry.     Capillary Refill: Capillary refill takes less than 2 seconds.  Neurological:     Mental Status: She is alert.  Psychiatric:        Mood and Affect: Mood normal.      UC Treatments / Results  Labs (all labs ordered are listed, but only abnormal results are displayed) Labs Reviewed  POCT RAPID STREP A (OFFICE) - Normal  SARS CORONAVIRUS 2 (TAT 6-24 HRS)    EKG   Radiology No results found.  Procedures Procedures (including critical care time)  Medications Ordered in UC Medications  acetaminophen  (TYLENOL ) tablet 650 mg (650 mg Oral Given 09/06/23 1144)    Initial Impression / Assessment and Plan / UC Course  I have reviewed the triage vital signs and the nursing notes.  Pertinent labs & imaging results that were available during my care of the patient were reviewed by me and considered in my medical decision making (see chart for details).     Given facial pain and pressure and ear pain will treat with antibiotic for acute sinusitis.  Lungs are clear to auscultation on exam.  Tessalon  prescribed.  Supportive care discussed.  Return precautions discussed. Final Clinical Impressions(s) / UC Diagnoses   Final diagnoses:  Acute cough  Acute non-recurrent sinusitis, unspecified location     Discharge Instructions      Take antibiotic as prescribed Can take tessalon  as needed for cough Recommend Flonase and Mucinex Drink plenty of fluids,  rest.  If no improvement or symptoms become worse return for evaluation.      ED Prescriptions     Medication Sig Dispense Auth. Provider   azithromycin  (ZITHROMAX  Z-PAK) 250 MG tablet Take 2 tablets by mouth on the first day and then 1 tablet by mouth for remaining days. 6 each Ward, Alon Mazor Z, PA-C   benzonatate  (TESSALON ) 100 MG capsule Take 1 capsule (100 mg total) by mouth every 8 (eight) hours. 21 capsule Ward, Johntavius Shepard Z, PA-C      PDMP not reviewed this encounter.   Ward, Harlene PEDLAR, PA-C 09/06/23 1214

## 2023-09-06 NOTE — Discharge Instructions (Addendum)
 Take antibiotic as prescribed Can take tessalon as needed for cough Recommend Flonase and Mucinex Drink plenty of fluids, rest.  If no improvement or symptoms become worse return for evaluation.

## 2023-09-06 NOTE — ED Triage Notes (Addendum)
 Cough, congestion, sore throat , bilateral ear pain x 4 days. Reports temp 101 last night

## 2023-09-07 LAB — SARS CORONAVIRUS 2 (TAT 6-24 HRS): SARS Coronavirus 2: NEGATIVE

## 2023-09-15 ENCOUNTER — Other Ambulatory Visit (HOSPITAL_COMMUNITY): Payer: Self-pay

## 2023-09-18 ENCOUNTER — Other Ambulatory Visit (HOSPITAL_COMMUNITY): Payer: Self-pay

## 2023-09-18 DIAGNOSIS — Z6838 Body mass index (BMI) 38.0-38.9, adult: Secondary | ICD-10-CM | POA: Diagnosis not present

## 2023-09-18 DIAGNOSIS — Z79899 Other long term (current) drug therapy: Secondary | ICD-10-CM | POA: Diagnosis not present

## 2023-09-18 DIAGNOSIS — M545 Low back pain, unspecified: Secondary | ICD-10-CM | POA: Diagnosis not present

## 2023-09-18 MED ORDER — OXYCODONE HCL 10 MG PO TABS
10.0000 mg | ORAL_TABLET | Freq: Every day | ORAL | 0 refills | Status: AC | PRN
  Filled 2023-09-18 – 2023-09-21 (×2): qty 150, 30d supply, fill #0

## 2023-09-21 ENCOUNTER — Other Ambulatory Visit (HOSPITAL_COMMUNITY): Payer: Self-pay

## 2023-09-22 DIAGNOSIS — Z79899 Other long term (current) drug therapy: Secondary | ICD-10-CM | POA: Diagnosis not present

## 2023-09-23 ENCOUNTER — Other Ambulatory Visit (HOSPITAL_COMMUNITY): Payer: Self-pay

## 2023-09-25 ENCOUNTER — Other Ambulatory Visit (HOSPITAL_COMMUNITY): Payer: Self-pay

## 2023-10-19 ENCOUNTER — Other Ambulatory Visit (HOSPITAL_COMMUNITY): Payer: Self-pay

## 2023-10-19 DIAGNOSIS — Z79899 Other long term (current) drug therapy: Secondary | ICD-10-CM | POA: Diagnosis not present

## 2023-10-19 DIAGNOSIS — M545 Low back pain, unspecified: Secondary | ICD-10-CM | POA: Diagnosis not present

## 2023-10-19 DIAGNOSIS — Z6839 Body mass index (BMI) 39.0-39.9, adult: Secondary | ICD-10-CM | POA: Diagnosis not present

## 2023-10-19 DIAGNOSIS — R030 Elevated blood-pressure reading, without diagnosis of hypertension: Secondary | ICD-10-CM | POA: Diagnosis not present

## 2023-10-19 MED ORDER — OXYCODONE-ACETAMINOPHEN 10-325 MG PO TABS
1.0000 | ORAL_TABLET | Freq: Every day | ORAL | 0 refills | Status: DC
  Filled 2023-10-19: qty 150, 30d supply, fill #0

## 2023-10-20 ENCOUNTER — Other Ambulatory Visit (HOSPITAL_COMMUNITY): Payer: Self-pay

## 2023-10-20 MED ORDER — OXYCODONE HCL 10 MG PO TABS
10.0000 mg | ORAL_TABLET | Freq: Every day | ORAL | 0 refills | Status: AC
  Filled 2023-10-20: qty 150, 30d supply, fill #0

## 2023-11-07 ENCOUNTER — Other Ambulatory Visit: Payer: Self-pay | Admitting: Internal Medicine

## 2023-11-09 ENCOUNTER — Other Ambulatory Visit: Payer: Self-pay

## 2023-11-16 ENCOUNTER — Other Ambulatory Visit (HOSPITAL_COMMUNITY): Payer: Self-pay

## 2023-11-16 DIAGNOSIS — M5416 Radiculopathy, lumbar region: Secondary | ICD-10-CM | POA: Diagnosis not present

## 2023-11-16 DIAGNOSIS — M129 Arthropathy, unspecified: Secondary | ICD-10-CM | POA: Diagnosis not present

## 2023-11-16 DIAGNOSIS — Z780 Asymptomatic menopausal state: Secondary | ICD-10-CM | POA: Diagnosis not present

## 2023-11-16 DIAGNOSIS — E6609 Other obesity due to excess calories: Secondary | ICD-10-CM | POA: Diagnosis not present

## 2023-11-16 DIAGNOSIS — Z79899 Other long term (current) drug therapy: Secondary | ICD-10-CM | POA: Diagnosis not present

## 2023-11-16 DIAGNOSIS — M25551 Pain in right hip: Secondary | ICD-10-CM | POA: Diagnosis not present

## 2023-11-16 DIAGNOSIS — M25552 Pain in left hip: Secondary | ICD-10-CM | POA: Diagnosis not present

## 2023-11-16 MED ORDER — OXYCODONE-ACETAMINOPHEN 10-325 MG PO TABS
1.0000 | ORAL_TABLET | Freq: Every day | ORAL | 0 refills | Status: DC
  Filled 2023-11-16: qty 150, 30d supply, fill #0

## 2023-11-16 MED ORDER — OXYCODONE HCL 10 MG PO TABS
10.0000 mg | ORAL_TABLET | Freq: Every day | ORAL | 0 refills | Status: AC
  Filled 2023-11-16 – 2023-11-17 (×2): qty 150, 30d supply, fill #0

## 2023-11-17 ENCOUNTER — Other Ambulatory Visit (HOSPITAL_COMMUNITY): Payer: Self-pay

## 2023-11-20 DIAGNOSIS — Z79899 Other long term (current) drug therapy: Secondary | ICD-10-CM | POA: Diagnosis not present

## 2023-11-23 ENCOUNTER — Encounter (INDEPENDENT_AMBULATORY_CARE_PROVIDER_SITE_OTHER): Payer: Medicaid Other | Admitting: Ophthalmology

## 2023-11-25 DIAGNOSIS — Z780 Asymptomatic menopausal state: Secondary | ICD-10-CM | POA: Diagnosis not present

## 2023-12-01 ENCOUNTER — Encounter (INDEPENDENT_AMBULATORY_CARE_PROVIDER_SITE_OTHER): Admitting: Ophthalmology

## 2023-12-01 DIAGNOSIS — H35033 Hypertensive retinopathy, bilateral: Secondary | ICD-10-CM

## 2023-12-01 DIAGNOSIS — H34811 Central retinal vein occlusion, right eye, with macular edema: Secondary | ICD-10-CM | POA: Diagnosis not present

## 2023-12-01 DIAGNOSIS — H35371 Puckering of macula, right eye: Secondary | ICD-10-CM

## 2023-12-01 DIAGNOSIS — H43813 Vitreous degeneration, bilateral: Secondary | ICD-10-CM

## 2023-12-01 DIAGNOSIS — I10 Essential (primary) hypertension: Secondary | ICD-10-CM | POA: Diagnosis not present

## 2023-12-17 ENCOUNTER — Other Ambulatory Visit: Payer: Self-pay

## 2023-12-17 ENCOUNTER — Other Ambulatory Visit (HOSPITAL_COMMUNITY): Payer: Self-pay

## 2023-12-17 DIAGNOSIS — M48061 Spinal stenosis, lumbar region without neurogenic claudication: Secondary | ICD-10-CM | POA: Diagnosis not present

## 2023-12-17 DIAGNOSIS — R29818 Other symptoms and signs involving the nervous system: Secondary | ICD-10-CM | POA: Diagnosis not present

## 2023-12-17 DIAGNOSIS — Z79899 Other long term (current) drug therapy: Secondary | ICD-10-CM | POA: Diagnosis not present

## 2023-12-17 DIAGNOSIS — R7303 Prediabetes: Secondary | ICD-10-CM | POA: Diagnosis not present

## 2023-12-17 DIAGNOSIS — E6609 Other obesity due to excess calories: Secondary | ICD-10-CM | POA: Diagnosis not present

## 2023-12-17 DIAGNOSIS — M5416 Radiculopathy, lumbar region: Secondary | ICD-10-CM | POA: Diagnosis not present

## 2023-12-17 DIAGNOSIS — Z6838 Body mass index (BMI) 38.0-38.9, adult: Secondary | ICD-10-CM | POA: Diagnosis not present

## 2023-12-17 MED ORDER — OXYCODONE HCL 10 MG PO TABS
10.0000 mg | ORAL_TABLET | Freq: Every day | ORAL | 0 refills | Status: DC | PRN
  Filled 2023-12-17 (×3): qty 150, 30d supply, fill #0

## 2023-12-17 MED ORDER — NALOXONE HCL 4 MG/0.1ML NA LIQD
NASAL | 0 refills | Status: DC
Start: 2023-12-17 — End: 2024-01-15
  Filled 2023-12-17 (×2): qty 2, 1d supply, fill #0

## 2023-12-20 ENCOUNTER — Other Ambulatory Visit: Payer: Self-pay | Admitting: Internal Medicine

## 2023-12-21 ENCOUNTER — Telehealth: Payer: Self-pay | Admitting: Internal Medicine

## 2023-12-21 DIAGNOSIS — F411 Generalized anxiety disorder: Secondary | ICD-10-CM | POA: Insufficient documentation

## 2023-12-21 NOTE — Telephone Encounter (Signed)
 Copied from CRM [id number]. Topic: Clinical - Prescription Issue >> Dec 21, 2023 12:34 PM Adaysia C wrote: Reason for CRM: Patient called to inform PCP that her pharmacy said they need a diagnoses for the RX:ALPRAZolam  (XANAX ) 1 MG tablet before they can release the medication to the patient; please follow up with patients preferred pharmacy with requested information  [facility] [address] [place], [place] - [address] [place], [place] [address] Phone: [phone]  Fax: [phone]

## 2023-12-21 NOTE — Telephone Encounter (Signed)
 Ok fro generalized anxiety disorder - F41.1

## 2023-12-22 NOTE — Telephone Encounter (Signed)
 Copied from CRM [id number]. Topic: Clinical - Prescription Issue >> Dec 22, 2023 10:32 AM Artemio Larry wrote: Reason for CRM: Patient called [facility] pharmacy needs call/fax today with patient diagnosis for XANAX  per Dr. Autry Legions 12/21/23 diagnosis generalized anxiety disorder - F41.1

## 2024-01-10 ENCOUNTER — Ambulatory Visit: Admission: EM | Admit: 2024-01-10 | Discharge: 2024-01-10 | Disposition: A | Discharge status: Home / Self Care

## 2024-01-10 ENCOUNTER — Inpatient Hospital Stay
Admission: RE | Admit: 2024-01-10 | Discharge: 2024-01-10 | Disposition: A | Discharge status: Home / Self Care | Payer: Self-pay | Source: Ambulatory Visit

## 2024-01-10 ENCOUNTER — Ambulatory Visit (INDEPENDENT_AMBULATORY_CARE_PROVIDER_SITE_OTHER)

## 2024-01-10 ENCOUNTER — Encounter: Payer: Self-pay | Admitting: Emergency Medicine

## 2024-01-10 DIAGNOSIS — M25562 Pain in left knee: Secondary | ICD-10-CM | POA: Diagnosis not present

## 2024-01-10 MED ORDER — PREDNISONE 20 MG PO TABS
40.0000 mg | ORAL_TABLET | Freq: Every day | ORAL | 0 refills | Status: AC
Start: 2024-01-10 — End: 2024-01-15

## 2024-01-10 NOTE — ED Provider Notes (Addendum)
 [name]    CSN: [id number] Arrival date & time: 01/10/24  1234      History   Chief Complaint Chief Complaint  Patient presents with   Knee Pain    HPI Pamela Gibson is a 58 y.o. female.   58 year old female who presents urgent care with complaints of left knee pain.  She reports that 2 weeks ago she was getting out of her husband's truck and stepped out and turned and felt some tension and pulling along the medial aspect of her knee.  She is continue to have pain in the area especially along the medial aspect.  She is not having any giving out.  She has a small amount of swelling.  She has never had any problems with her knees in the past.  She has been using oxycodone .  She has not been using a brace or ice.  She has not had to see an orthopedist in a while but has seen an orthopedist for pain and swelling in her hands in the past at [place].   Knee Pain Associated symptoms: no back pain and no fever     Past Medical History:  Diagnosis Date   Anxiety    BACK PAIN 07/23/2007   Barrett's esophagus    CHEST PAIN 09/04/2010   Colon adenoma    Cyst of right ovary 07/03/2015   DEGENERATIVE JOINT DISEASE, LUMBAR SPINE 07/23/2007   Depression    Gastric polyp    x3   GERD 07/22/2007   H/O total hysterectomy    HYPERLIPIDEMIA 07/22/2007   IBS 07/23/2007   MITRAL VALVE PROLAPSE 07/22/2007   Pinched nerve    LEFT SIDE, DISC 3,4,5   Pneumonia    YEARS AGO   SINUSITIS- ACUTE-NOS 09/09/2007   TACHYCARDIA 08/20/2010   Tuberculosis     MOTHER HAD YEARS AGO   Unspecified Vaginitis and Vulvovaginitis 12/17/2007    Patient Active Problem List   Diagnosis Date Noted   GAD (generalized anxiety disorder) 12/21/2023   Skin tag 02/19/2023   Headache 09/07/2021   HTN (hypertension) 09/07/2021   Acute cystitis with hematuria 07/17/2021   Bursitis of right hip 06/15/2021   Urinary frequency 06/14/2021   Paresthesias in right hand 04/24/2021    Chronic pain syndrome 02/13/2021   History of hysterectomy 02/13/2021   Menopausal flushing 02/13/2021   Mass of uterine adnexa 02/13/2021   Obesity 02/13/2021   Pain in rectum 02/13/2021   Vasomotor symptoms due to menopause 02/13/2021   Aortic atherosclerosis (HCC) 02/13/2021   Vitamin B12 deficiency 08/10/2019   Vitamin D  deficiency 08/10/2019   Chest pain 04/12/2019   Hypersomnolence 02/08/2019   Hyperglycemia 02/08/2019   Cloudy urine 08/05/2018   Right flank pain 05/04/2018   Dysuria 05/04/2018   Polycythemia 02/05/2018   Hot flashes 02/05/2018   Microhematuria 01/09/2017   Insomnia 01/09/2017   Disorder of function of stomach 09/06/2016   Arthritis of hand, degenerative 07/03/2016   Cyst of right ovary 07/03/2015   Abnormal urine 12/27/2014   AKI (acute kidney injury) (HCC) 12/27/2014   Hand pain 06/15/2013   Chronic low back pain 06/15/2013   Lumbar disc disease 06/15/2013   Abnormal liver function test 10/27/2012   Allergic rhinitis 02/13/2012   Vertigo 02/13/2012   Anxiety 12/08/2011   Multiple gastric polyps 12/11/2010   Adenomatous colon polyp 12/11/2010   Encounter for well adult exam with abnormal findings 12/08/2010   IBS 07/23/2007   DEGENERATIVE JOINT DISEASE, LUMBAR SPINE  07/23/2007   Hyperlipidemia 07/22/2007   MITRAL VALVE PROLAPSE 07/22/2007   GERD 07/22/2007   Mitral valve disease 07/22/2007    Past Surgical History:  Procedure Laterality Date   ABDOMINAL HYSTERECTOMY     BIOPSY  04/17/2022   Procedure: BIOPSY;  Surgeon: Daina Drum, MD;  Location: [facility];  Service: Gastroenterology;;   CHOLECYSTECTOMY     COLONOSCOPY WITH PROPOFOL  N/A 04/17/2022   Procedure: COLONOSCOPY WITH PROPOFOL ;  Surgeon: Daina Drum, MD;  Location: [facility];  Service: Gastroenterology;  Laterality: N/A;   CYSTOSCOPY  11/11/2017   Procedure: CYSTOSCOPY;  Surgeon: Keene Pastures, DO;  Location: [facility];  Service: Gynecology;;   ESOPHAGOGASTRODUODENOSCOPY  (EGD) WITH PROPOFOL  N/A 04/17/2022   Procedure: ESOPHAGOGASTRODUODENOSCOPY (EGD) WITH PROPOFOL ;  Surgeon: Daina Drum, MD;  Location: [facility];  Service: Gastroenterology;  Laterality: N/A;   LAPAROSCOPIC BILATERAL SALPINGO OOPHERECTOMY Bilateral 11/11/2017   Procedure: LAPAROSCOPIC BILATERAL SALPINGO OOPHORECTOMY;  Surgeon: Ozan, Jennifer, DO;  Location: [facility];  Service: Gynecology;  Laterality: Bilateral;   POLYPECTOMY  04/17/2022   Procedure: POLYPECTOMY;  Surgeon: Daina Drum, MD;  Location: [facility];  Service: Gastroenterology;;   RLE tib/fib fx/surgury     TONSILLECTOMY     TUBAL LIGATION      OB History   No obstetric history on file.      Home Medications    Prior to Admission medications   Medication Sig Start Date End Date Taking? Authorizing Provider  CAPVAXIVE 0.5 ML injection  11/10/23  Yes [provider]  diclofenac  Sodium (VOLTAREN ) 1 % GEL Apply 4 g topically. 07/03/16  Yes [provider]  nystatin  (MYCOSTATIN /NYSTOP ) powder Apply topically. 09/06/16  Yes [provider]  ALPRAZolam  (XANAX ) 1 MG tablet Take 1 tablet by mouth three times daily as needed 12/21/23   Roslyn Coombe, MD  aspirin 81 MG EC tablet Take 81 mg by mouth every other day.    [provider]  azithromycin  (ZITHROMAX  Z-PAK) 250 MG tablet Take 2 tablets by mouth on the first day and then 1 tablet by mouth for remaining days. 09/06/23   Ward, Char Common, PA-C  benzonatate  (TESSALON ) 100 MG capsule Take 1 capsule (100 mg total) by mouth every 8 (eight) hours. 09/06/23   Ward, Jessica Z, PA-C  calcium carbonate (TUMS - DOSED IN MG ELEMENTAL CALCIUM) 500 MG chewable tablet Chew 1-2 tablets by mouth daily as needed for indigestion or heartburn.    [provider]  celecoxib (CELEBREX) 200 MG capsule Take 200 mg by mouth daily as needed. Patient not taking: Reported on 09/06/2023 04/16/23   [provider]  Cholecalciferol (VITAMIN D3) 25 MCG (1000  UT) CAPS Take 2,000 Units by mouth daily.    [provider]  [employer] PURELAX 17 GM/SCOOP powder Take 0.5 Containers by mouth daily as needed for mild constipation or moderate constipation. 05/20/20   [provider]  gabapentin  (NEURONTIN ) 100 MG capsule Take 1 capsule (100 mg total) by mouth 3 (three) times daily. Patient not taking: Reported on 09/06/2023 09/04/21   Roslyn Coombe, MD  lidocaine  (LIDODERM ) 5 % Place 1-3 patches onto skin as directed. Patient not taking: Reported on 09/06/2023 04/02/22     lidocaine  (LIDODERM ) 5 % Place 1 - 3 patches onto the skin as directed. Patches may be worn for up to 12 hours and must be off for 12 hours between uses. Patient not taking: Reported on 09/06/2023 07/25/22     losartan  (COZAAR ) 25  MG tablet Take 1 tablet by mouth once daily 11/09/23   Roslyn Coombe, MD  Multiple Vitamins-Minerals (MULTIVITAMIN WITH MINERALS) tablet Take 1 tablet by mouth daily.    [provider]  naloxone  (NARCAN ) nasal spray 4 mg/0.1 mL Use  1 spray every 3 minutes; spray 1 dose into ONE nostril; alternate nostrils w each dose until help arrives 11/25/22     naloxone  (NARCAN ) nasal spray 4 mg/0.1 mL Spray 1 spray every 3 minutes; spray into ONE nostril; alternate nostrils w each dose until help arrives. 12/23/22     naloxone  (NARCAN ) nasal spray 4 mg/0.1 mL Spray 1 dose into ONE nostril every 3 minutes; alternate nostrils with each dose until help arrives 07/21/23     naloxone  (NARCAN ) nasal spray 4 mg/0.1 mL 1 spray every 3 minutes; spray 1 dose into ONE nostril; alternate nostrils w each dose until help arrives 08/24/23     naloxone  (NARCAN ) nasal spray 4 mg/0.1 mL Use 1 spray every 3 minutes; spray 1 dose into ONE nostril; alternate nostrils with each dose until help arrives 12/17/23     NARCAN  4 MG/0.1ML LIQD nasal spray kit Place 1 spray into the nose once. 10/10/20   [provider]  Oxycodone  HCl 10 MG TABS Take 1 tablet (10 mg total) by mouth 5 (five)  times daily. 09/18/23     Oxycodone  HCl 10 MG TABS Take 1 tablet (10 mg total) by mouth 5 (five) times daily. 10/20/23     Oxycodone  HCl 10 MG TABS Take 1 tablet (10 mg total) by mouth 5 (five) times daily. 11/16/23     Oxycodone  HCl 10 MG TABS Take 1 tablet (10 mg total) by mouth 5 (five) times daily as needed. 12/17/23     oxyCODONE -acetaminophen  (PERCOCET) 10-325 MG tablet Take 1 tablet by mouth every four hours as needed for pain 04/02/22     oxyCODONE -acetaminophen  (PERCOCET) 10-325 MG tablet Take 1 tablet by mouth every 4 (four) hours as needed for pain. 07/01/22     oxyCODONE -acetaminophen  (PERCOCET) 10-325 MG tablet Take 1 tablet by mouth every 4 (four) hours as needed. 09/18/22     oxyCODONE -acetaminophen  (PERCOCET) 10-325 MG tablet Take 1 tablet by mouth every 4 hours as needed 09/25/22     oxyCODONE -acetaminophen  (PERCOCET) 10-325 MG tablet Take 1 tablet by mouth every 4 hours as needed. 01/20/23     oxyCODONE -acetaminophen  (PERCOCET) 10-325 MG tablet Take 1 tablet by mouth five times a day. 02/20/23     oxyCODONE -acetaminophen  (PERCOCET) 10-325 MG tablet Take 1 tablet by mouth 5 times daily. 03/20/23     oxyCODONE -acetaminophen  (PERCOCET) 10-325 MG tablet Take 1 tablet by mouth 5 (five) times daily. 04/17/23     oxyCODONE -acetaminophen  (PERCOCET) 10-325 MG tablet Take 1 tablet by mouth 5 (five) times daily. 05/14/23     pantoprazole  (PROTONIX ) 40 MG tablet Take 1 tablet (40 mg total) by mouth daily. 02/19/23   Roslyn Coombe, MD  pravastatin  (PRAVACHOL ) 40 MG tablet Take 1 tablet by mouth once daily 04/24/23   Roslyn Coombe, MD  predniSONE  (DELTASONE ) 20 MG tablet Take 2 tablets (40 mg total) by mouth daily with breakfast for 5 days. 01/10/24 01/15/24 Yes Vyctoria Dickman A, PA-C  promethazine -dextromethorphan (PROMETHAZINE -DM) 6.25-15 MG/5ML syrup Take 5 mLs by mouth 4 (four) times daily as needed. Patient not taking: Reported on 09/06/2023 05/08/23   Colene Dauphin, MD  sulfamethoxazole -trimethoprim   (BACTRIM  DS) 800-160 MG tablet Take 1 tablet by mouth 2 times daily Patient not  taking: Reported on 09/06/2023 05/28/23     traZODone  (DESYREL ) 50 MG tablet Take 0.5-1 tablets (25-50 mg total) by mouth at bedtime as needed for sleep. Patient not taking: Reported on 09/06/2023 09/04/21   Roslyn Coombe, MD  triamcinolone  cream (KENALOG ) 0.1 % Apply to affected area  2 (two) times daily. 09/04/23   Adelia Homestead, MD  vitamin B-12 (CYANOCOBALAMIN ) 1000 MCG tablet Take 1 tablet (1,000 mcg total) by mouth daily. Patient taking differently: Take 1,000 mcg by mouth 3 (three) times a week. 08/10/19   Roslyn Coombe, MD    Family History Family History  Problem Relation Age of Onset   Colon polyps Mother    Angina Mother    Heart disease Mother        CVA   Coronary artery disease Mother    Stroke Mother    Hypertension Other    Diabetes Other    Hyperlipidemia Other    Breast cancer Neg Hx    Esophageal cancer Neg Hx    Colon cancer Neg Hx    Pancreatic cancer Neg Hx    Stomach cancer Neg Hx    Liver disease Neg Hx     Social History Social History   Tobacco Use   Smoking status: Never    Passive exposure: Never   Smokeless tobacco: Never  Vaping Use   Vaping status: Never Used  Substance Use Topics   Alcohol use: Yes    Comment: occasional   Drug use: No     Allergies   Atorvastatin, Ibuprofen, Morphine  sulfate er, Penicillin g, Penicillins, Simvastatin, Tetracycline, and Tetracycline hcl   Review of Systems Review of Systems  Constitutional:  Negative for chills and fever.  HENT:  Negative for ear pain and sore throat.   Eyes:  Negative for pain and visual disturbance.  Respiratory:  Negative for cough and shortness of breath.   Cardiovascular:  Negative for chest pain and palpitations.  Gastrointestinal:  Negative for abdominal pain and vomiting.  Genitourinary:  Negative for dysuria and hematuria.  Musculoskeletal:  Negative for arthralgias and back pain.        Left knee pain  Skin:  Negative for color change and rash.  Neurological:  Negative for seizures and syncope.  All other systems reviewed and are negative.    Physical Exam Triage Vital Signs ED Triage Vitals  Encounter Vitals Group     BP 01/10/24 1300 124/77     Systolic BP Percentile --      Diastolic BP Percentile --      Pulse Rate 01/10/24 1300 83     Resp 01/10/24 1300 18     Temp 01/10/24 1300 98.7 F (37.1 C)     Temp Source 01/10/24 1300 Oral     SpO2 01/10/24 1300 95 %     Weight 01/10/24 1259 229 lb 15 oz (104.3 kg)     Height --      Head Circumference --      Peak Flow --      Pain Score 01/10/24 1257 4     Pain Loc --      Pain Education --      Exclude from Growth Chart --    No data found.  Updated Vital Signs BP 124/77 (BP Location: Left Arm)   Pulse 83   Temp 98.7 F (37.1 C) (Oral)   Resp 18   Wt 229 lb 15 oz (104.3 kg)   SpO2 95%  BMI 38.26 kg/m   Visual Acuity Right Eye Distance:   Left Eye Distance:   Bilateral Distance:    Right Eye Near:   Left Eye Near:    Bilateral Near:     Physical Exam Vitals and nursing note reviewed.  Constitutional:      General: She is not in acute distress.    Appearance: She is well-developed.  HENT:     Head: Normocephalic and atraumatic.  Eyes:     Conjunctiva/sclera: Conjunctivae normal.  Cardiovascular:     Rate and Rhythm: Normal rate and regular rhythm.     Heart sounds: No murmur heard. Pulmonary:     Effort: Pulmonary effort is normal. No respiratory distress.     Breath sounds: Normal breath sounds.  Abdominal:     Palpations: Abdomen is soft.     Tenderness: There is no abdominal tenderness.  Musculoskeletal:        General: No swelling.     Cervical back: Neck supple.     Left knee: Swelling (Trace) present. No bony tenderness. Tenderness present over the medial joint line. No LCL laxity, MCL laxity or ACL laxity.Normal patellar mobility. Normal pulse.     Instability Tests:  Anterior drawer test negative. Posterior drawer test negative. Medial McMurray test negative and lateral McMurray test negative.     Comments: Mild swelling in both lower extremities  Skin:    General: Skin is warm and dry.     Capillary Refill: Capillary refill takes less than 2 seconds.  Neurological:     Mental Status: She is alert.  Psychiatric:        Mood and Affect: Mood normal.      UC Treatments / Results  Labs (all labs ordered are listed, but only abnormal results are displayed) Labs Reviewed - No data to display  EKG   Radiology DG Knee Complete 4 Views Left Result Date: 01/10/2024 CLINICAL DATA:  Left knee pain.  Acute pain of left knee. EXAM: LEFT KNEE - COMPLETE 4+ VIEW COMPARISON:  None Available. FINDINGS: No evidence of fracture, dislocation, or joint effusion. The alignment and joint spaces are preserved. There is trace peripheral spurring in the medial tibiofemoral compartment. No erosive change or focal bone abnormality. Soft tissues are unremarkable. IMPRESSION: Trace peripheral spurring in the medial tibiofemoral compartment. Electronically Signed   By: Chadwick Colonel M.D.   On: 01/10/2024 13:31    Procedures Procedures (including critical care time)  Medications Ordered in UC Medications - No data to display  Initial Impression / Assessment and Plan / UC Course  I have reviewed the triage vital signs and the nursing notes.  Pertinent labs & imaging results that were available during my care of the patient were reviewed by me and considered in my medical decision making (see chart for details).     Acute pain of left knee - Plan: DG Knee Complete 4 Views Left, DG Knee Complete 4 Views Left   X-ray shows mild spurring which suggest some aspect of arthritis but we can not rule out a meniscus tear or less likely a ligament injury. Will try stronger anti-inflammatories to help with the inflammation. Still will need to follow up with orthopedics for  further evaluation. We will treat with the following:  Prednisone  40 mg (2 tablets) once daily for 5 days.   Ice the area 2-3 times daily for 10-15 minutes to help with pain and swelling. Do not apply ice directly to the skin.  Compression wrap  or knee sleeve while up walking or standing. May remove at night.  Recommend wearing shoes with good support.  Recommend following with orthopedics to evaluation the knee pain  Final Clinical Impressions(s) / UC Diagnoses   Final diagnoses:  Acute pain of left knee     Discharge Instructions      X-ray shows mild spurring which suggest some aspect of arthritis but we can not rule out a meniscus tear or less likely a ligament injury. Will try stronger anti-inflammatories to help with the inflammation. Still will need to follow up with orthopedics for further evaluation. We will treat with the following:  Prednisone  40 mg (2 tablets) once daily for 5 days. Take this in the morning.  This is a steroid to help with inflammation.  Ice the area 2-3 times daily for 10-15 minutes to help with pain and swelling. Do not apply ice directly to the skin.  Compression wrap or knee sleeve while up walking or standing. May remove at night.  Recommend wearing shoes with good support.  Recommend following with orthopedics to evaluation the knee pain  ED Prescriptions     Medication Sig Dispense Auth. Provider   predniSONE  (DELTASONE ) 20 MG tablet Take 2 tablets (40 mg total) by mouth daily with breakfast for 5 days. 10 tablet Kreg Pesa, [place]      PDMP not reviewed this encounter.   Kreg Pesa, PA-C 01/10/24 1402    Kreg Pesa, PA-C 01/10/24 1404

## 2024-01-10 NOTE — Discharge Instructions (Addendum)
 X-ray shows mild spurring which suggest some aspect of arthritis but we can not rule out a meniscus tear or less likely a ligament injury. Will try stronger anti-inflammatories to help with the inflammation. Still will need to follow up with orthopedics for further evaluation. We will treat with the following:  Prednisone  40 mg (2 tablets) once daily for 5 days. Take this in the morning.  This is a steroid to help with inflammation.  Ice the area 2-3 times daily for 10-15 minutes to help with pain and swelling. Do not apply ice directly to the skin.  Compression wrap or knee sleeve while up walking or standing. May remove at night.  Recommend wearing shoes with good support.  Recommend following with orthopedics to evaluation the knee pain

## 2024-01-10 NOTE — ED Triage Notes (Signed)
 Pt c/o knee pain of left knee. Pt says she felt a little pull in the knee area when climbing out of her husband's truck. Now she is having a hard time walking on it.

## 2024-01-12 DIAGNOSIS — S83412A Sprain of medial collateral ligament of left knee, initial encounter: Secondary | ICD-10-CM | POA: Diagnosis not present

## 2024-01-12 DIAGNOSIS — M1712 Unilateral primary osteoarthritis, left knee: Secondary | ICD-10-CM | POA: Diagnosis not present

## 2024-01-15 ENCOUNTER — Other Ambulatory Visit (HOSPITAL_COMMUNITY): Payer: Self-pay

## 2024-01-15 DIAGNOSIS — I10 Essential (primary) hypertension: Secondary | ICD-10-CM | POA: Diagnosis not present

## 2024-01-15 DIAGNOSIS — E6609 Other obesity due to excess calories: Secondary | ICD-10-CM | POA: Diagnosis not present

## 2024-01-15 DIAGNOSIS — Z9181 History of falling: Secondary | ICD-10-CM | POA: Diagnosis not present

## 2024-01-15 DIAGNOSIS — Z79899 Other long term (current) drug therapy: Secondary | ICD-10-CM | POA: Diagnosis not present

## 2024-01-15 DIAGNOSIS — M5416 Radiculopathy, lumbar region: Secondary | ICD-10-CM | POA: Diagnosis not present

## 2024-01-15 DIAGNOSIS — R7303 Prediabetes: Secondary | ICD-10-CM | POA: Diagnosis not present

## 2024-01-15 DIAGNOSIS — M48061 Spinal stenosis, lumbar region without neurogenic claudication: Secondary | ICD-10-CM | POA: Diagnosis not present

## 2024-01-15 MED ORDER — NALOXONE HCL 4 MG/0.1ML NA LIQD
1.0000 | NASAL | 0 refills | Status: AC
  Filled 2024-01-15: qty 2, 1d supply, fill #0

## 2024-01-15 MED ORDER — OXYCODONE HCL 10 MG PO TABS
10.0000 mg | ORAL_TABLET | Freq: Every day | ORAL | 0 refills | Status: AC | PRN
  Filled 2024-01-15: qty 150, 30d supply, fill #0

## 2024-01-21 ENCOUNTER — Encounter: Payer: Self-pay | Admitting: Emergency Medicine

## 2024-01-21 ENCOUNTER — Ambulatory Visit
Admission: EM | Admit: 2024-01-21 | Discharge: 2024-01-21 | Disposition: A | Discharge status: Home / Self Care | Attending: Physician Assistant | Admitting: Physician Assistant

## 2024-01-21 ENCOUNTER — Ambulatory Visit: Payer: Self-pay

## 2024-01-21 DIAGNOSIS — M545 Low back pain, unspecified: Secondary | ICD-10-CM | POA: Diagnosis not present

## 2024-01-21 LAB — POCT URINALYSIS DIP (MANUAL ENTRY)
Bilirubin, UA: NEGATIVE
Glucose, UA: NEGATIVE mg/dL
Ketones, POC UA: NEGATIVE mg/dL
Leukocytes, UA: NEGATIVE
Nitrite, UA: NEGATIVE
Protein Ur, POC: NEGATIVE mg/dL
Spec Grav, UA: 1.02 (ref 1.010–1.025)
Urobilinogen, UA: 2 U/dL — AB
pH, UA: 7 (ref 5.0–8.0)

## 2024-01-21 NOTE — Discharge Instructions (Signed)
 Return if any problems.

## 2024-01-21 NOTE — ED Triage Notes (Signed)
 Pt reports dysuria and frequency x2 days. Tried self-treating with cranberry juice at home with no relief.

## 2024-01-21 NOTE — ED Provider Notes (Signed)
 [name]    CSN: [id number] Arrival date & time: 01/21/24  1054      History   Chief Complaint Chief Complaint  Patient presents with   UTI Symptoms    HPI Pamela Gibson is a 58 y.o. female.   Patient complains of pain in her right low back.  Patient reports she noticed some increased urinary frequency and was concerned that she could have a urinary tract infection.  Patient denies any fever or chills she denies any nausea or vomiting she is not having any abdominal pain.  Patient reports that she drank some cranberry juice and the back pain resolved.  Patient reports she is here today just to get a urine check to make sure she does not have a urinary tract infection  The history is provided by the patient. No language interpreter was used.    Past Medical History:  Diagnosis Date   Anxiety    BACK PAIN 07/23/2007   Barrett's esophagus    CHEST PAIN 09/04/2010   Colon adenoma    Cyst of right ovary 07/03/2015   DEGENERATIVE JOINT DISEASE, LUMBAR SPINE 07/23/2007   Depression    Gastric polyp    x3   GERD 07/22/2007   H/O total hysterectomy    HYPERLIPIDEMIA 07/22/2007   IBS 07/23/2007   MITRAL VALVE PROLAPSE 07/22/2007   Pinched nerve    LEFT SIDE, DISC 3,4,5   Pneumonia    YEARS AGO   SINUSITIS- ACUTE-NOS 09/09/2007   TACHYCARDIA 08/20/2010   Tuberculosis     MOTHER HAD YEARS AGO   Unspecified Vaginitis and Vulvovaginitis 12/17/2007    Patient Active Problem List   Diagnosis Date Noted   GAD (generalized anxiety disorder) 12/21/2023   Skin tag 02/19/2023   Headache 09/07/2021   HTN (hypertension) 09/07/2021   Acute cystitis with hematuria 07/17/2021   Bursitis of right hip 06/15/2021   Urinary frequency 06/14/2021   Paresthesias in right hand 04/24/2021   Chronic pain syndrome 02/13/2021   History of hysterectomy 02/13/2021   Menopausal flushing 02/13/2021   Mass of uterine adnexa 02/13/2021   Obesity 02/13/2021   Pain in rectum  02/13/2021   Vasomotor symptoms due to menopause 02/13/2021   Aortic atherosclerosis (HCC) 02/13/2021   Vitamin B12 deficiency 08/10/2019   Vitamin D  deficiency 08/10/2019   Chest pain 04/12/2019   Hypersomnolence 02/08/2019   Hyperglycemia 02/08/2019   Cloudy urine 08/05/2018   Right flank pain 05/04/2018   Dysuria 05/04/2018   Polycythemia 02/05/2018   Hot flashes 02/05/2018   Microhematuria 01/09/2017   Insomnia 01/09/2017   Disorder of function of stomach 09/06/2016   Arthritis of hand, degenerative 07/03/2016   Cyst of right ovary 07/03/2015   Abnormal urine 12/27/2014   AKI (acute kidney injury) (HCC) 12/27/2014   Hand pain 06/15/2013   Chronic low back pain 06/15/2013   Lumbar disc disease 06/15/2013   Abnormal liver function test 10/27/2012   Allergic rhinitis 02/13/2012   Vertigo 02/13/2012   Anxiety 12/08/2011   Multiple gastric polyps 12/11/2010   Adenomatous colon polyp 12/11/2010   Encounter for well adult exam with abnormal findings 12/08/2010   IBS 07/23/2007   DEGENERATIVE JOINT DISEASE, LUMBAR SPINE 07/23/2007   Hyperlipidemia 07/22/2007   MITRAL VALVE PROLAPSE 07/22/2007   GERD 07/22/2007   Mitral valve disease 07/22/2007    Past Surgical History:  Procedure Laterality Date   ABDOMINAL HYSTERECTOMY     BIOPSY  04/17/2022   Procedure: BIOPSY;  Surgeon: Rosaline Coma,  Mai Schwalbe, MD;  Location: [facility];  Service: Gastroenterology;;   CHOLECYSTECTOMY     COLONOSCOPY WITH PROPOFOL  N/A 04/17/2022   Procedure: COLONOSCOPY WITH PROPOFOL ;  Surgeon: Daina Drum, MD;  Location: [facility];  Service: Gastroenterology;  Laterality: N/A;   CYSTOSCOPY  11/11/2017   Procedure: CYSTOSCOPY;  Surgeon: Keene Pastures, DO;  Location: [facility];  Service: Gynecology;;   ESOPHAGOGASTRODUODENOSCOPY (EGD) WITH PROPOFOL  N/A 04/17/2022   Procedure: ESOPHAGOGASTRODUODENOSCOPY (EGD) WITH PROPOFOL ;  Surgeon: Daina Drum, MD;  Location: [facility];  Service: Gastroenterology;   Laterality: N/A;   LAPAROSCOPIC BILATERAL SALPINGO OOPHERECTOMY Bilateral 11/11/2017   Procedure: LAPAROSCOPIC BILATERAL SALPINGO OOPHORECTOMY;  Surgeon: Ozan, Jennifer, DO;  Location: [facility];  Service: Gynecology;  Laterality: Bilateral;   POLYPECTOMY  04/17/2022   Procedure: POLYPECTOMY;  Surgeon: Daina Drum, MD;  Location: [facility];  Service: Gastroenterology;;   RLE tib/fib fx/surgury     TONSILLECTOMY     TUBAL LIGATION      OB History   No obstetric history on file.      Home Medications    Prior to Admission medications   Medication Sig Start Date End Date Taking? Authorizing Provider  ALPRAZolam  (XANAX ) 1 MG tablet Take 1 tablet by mouth three times daily as needed 12/21/23  Yes Roslyn Coombe, MD  aspirin 81 MG EC tablet Take 81 mg by mouth every other day.   Yes [provider]  calcium carbonate (TUMS - DOSED IN MG ELEMENTAL CALCIUM) 500 MG chewable tablet Chew 1-2 tablets by mouth daily as needed for indigestion or heartburn.   Yes [provider]  Cholecalciferol (VITAMIN D3) 25 MCG (1000 UT) CAPS Take 2,000 Units by mouth daily.   Yes [provider]  [employer] PURELAX 17 GM/SCOOP powder Take 0.5 Containers by mouth daily as needed for mild constipation or moderate constipation. 05/20/20  Yes [provider]  diclofenac  Sodium (VOLTAREN ) 1 % GEL Apply 4 g topically. 07/03/16  Yes [provider]  losartan  (COZAAR ) 25 MG tablet Take 1 tablet by mouth once daily 11/09/23  Yes Roslyn Coombe, MD  Multiple Vitamins-Minerals (MULTIVITAMIN WITH MINERALS) tablet Take 1 tablet by mouth daily.   Yes [provider]  nystatin  (MYCOSTATIN /NYSTOP ) powder Apply topically. 09/06/16  Yes [provider]  Oxycodone  HCl 10 MG TABS Take 1 tablet (10 mg total) by mouth 5 (five) times daily. 09/18/23  Yes   pantoprazole  (PROTONIX ) 40 MG tablet Take 1 tablet (40 mg total) by mouth daily. 02/19/23  Yes Roslyn Coombe, MD  pravastatin   (PRAVACHOL ) 40 MG tablet Take 1 tablet by mouth once daily 04/24/23  Yes Roslyn Coombe, MD  triamcinolone  cream (KENALOG ) 0.1 % Apply to affected area  2 (two) times daily. 09/04/23  Yes Adelia Homestead, MD  azithromycin  (ZITHROMAX  Z-PAK) 250 MG tablet Take 2 tablets by mouth on the first day and then 1 tablet by mouth for remaining days. Patient not taking: Reported on 01/21/2024 09/06/23   Ward, Char Common, PA-C  benzonatate  (TESSALON ) 100 MG capsule Take 1 capsule (100 mg total) by mouth every 8 (eight) hours. Patient not taking: Reported on 01/21/2024 09/06/23   Ward, Char Common, PA-C  CAPVAXIVE 0.5 ML injection  11/10/23   [provider]  celecoxib (CELEBREX) 200 MG capsule Take 200 mg by mouth daily as needed. Patient not taking: Reported on 09/06/2023 04/16/23   [provider]  gabapentin  (NEURONTIN ) 100 MG capsule Take 1 capsule (100 mg total)  by mouth 3 (three) times daily. Patient not taking: Reported on 09/06/2023 09/04/21   Roslyn Coombe, MD  lidocaine  (LIDODERM ) 5 % Place 1-3 patches onto skin as directed. Patient not taking: Reported on 09/06/2023 04/02/22     lidocaine  (LIDODERM ) 5 % Place 1 - 3 patches onto the skin as directed. Patches may be worn for up to 12 hours and must be off for 12 hours between uses. Patient not taking: Reported on 09/06/2023 07/25/22     naloxone  (NARCAN ) nasal spray 4 mg/0.1 mL Use  1 spray every 3 minutes; spray 1 dose into ONE nostril; alternate nostrils w each dose until help arrives 11/25/22     naloxone  (NARCAN ) nasal spray 4 mg/0.1 mL Spray 1 spray every 3 minutes; spray into ONE nostril; alternate nostrils w each dose until help arrives. 12/23/22     naloxone  (NARCAN ) nasal spray 4 mg/0.1 mL Spray 1 dose into ONE nostril every 3 minutes; alternate nostrils with each dose until help arrives 07/21/23     naloxone  (NARCAN ) nasal spray 4 mg/0.1 mL 1 spray every 3 minutes; spray 1 dose into ONE nostril; alternate nostrils w each dose until help  arrives 08/24/23     naloxone  (NARCAN ) nasal spray 4 mg/0.1 mL 1 spray every 3 minutes; spray 1 dose into ONE nostril; alternate nostrils w each dose until help arrives 01/15/24     NARCAN  4 MG/0.1ML LIQD nasal spray kit Place 1 spray into the nose once. 10/10/20   [provider]  Oxycodone  HCl 10 MG TABS Take 1 tablet (10 mg total) by mouth 5 (five) times daily. 10/20/23     Oxycodone  HCl 10 MG TABS Take 1 tablet (10 mg total) by mouth 5 (five) times daily. 11/16/23     Oxycodone  HCl 10 MG TABS Take 1 tablet (10 mg total) by mouth 5 (five) times daily as needed. 01/15/24     oxyCODONE -acetaminophen  (PERCOCET) 10-325 MG tablet Take 1 tablet by mouth every four hours as needed for pain 04/02/22     oxyCODONE -acetaminophen  (PERCOCET) 10-325 MG tablet Take 1 tablet by mouth every 4 (four) hours as needed for pain. Patient not taking: Reported on 01/21/2024 07/01/22     oxyCODONE -acetaminophen  (PERCOCET) 10-325 MG tablet Take 1 tablet by mouth every 4 (four) hours as needed. 09/18/22     oxyCODONE -acetaminophen  (PERCOCET) 10-325 MG tablet Take 1 tablet by mouth every 4 hours as needed 09/25/22     oxyCODONE -acetaminophen  (PERCOCET) 10-325 MG tablet Take 1 tablet by mouth every 4 hours as needed. 01/20/23     oxyCODONE -acetaminophen  (PERCOCET) 10-325 MG tablet Take 1 tablet by mouth five times a day. 02/20/23     oxyCODONE -acetaminophen  (PERCOCET) 10-325 MG tablet Take 1 tablet by mouth 5 times daily. 03/20/23     oxyCODONE -acetaminophen  (PERCOCET) 10-325 MG tablet Take 1 tablet by mouth 5 (five) times daily. 04/17/23     oxyCODONE -acetaminophen  (PERCOCET) 10-325 MG tablet Take 1 tablet by mouth 5 (five) times daily. 05/14/23     promethazine -dextromethorphan (PROMETHAZINE -DM) 6.25-15 MG/5ML syrup Take 5 mLs by mouth 4 (four) times daily as needed. Patient not taking: Reported on 09/06/2023 05/08/23   Colene Dauphin, MD  sulfamethoxazole -trimethoprim  (BACTRIM  DS) 800-160 MG tablet Take 1 tablet by mouth 2 times  daily Patient not taking: Reported on 09/06/2023 05/28/23     traZODone  (DESYREL ) 50 MG tablet Take 0.5-1 tablets (25-50 mg total) by mouth at bedtime as needed for sleep. Patient not taking: Reported on 09/06/2023 09/04/21  Roslyn Coombe, MD  vitamin B-12 (CYANOCOBALAMIN ) 1000 MCG tablet Take 1 tablet (1,000 mcg total) by mouth daily. Patient taking differently: Take 1,000 mcg by mouth 3 (three) times a week. 08/10/19   Roslyn Coombe, MD    Family History Family History  Problem Relation Age of Onset   Colon polyps Mother    Angina Mother    Heart disease Mother        CVA   Coronary artery disease Mother    Stroke Mother    Hypertension Other    Diabetes Other    Hyperlipidemia Other    Breast cancer Neg Hx    Esophageal cancer Neg Hx    Colon cancer Neg Hx    Pancreatic cancer Neg Hx    Stomach cancer Neg Hx    Liver disease Neg Hx     Social History Social History   Tobacco Use   Smoking status: Never    Passive exposure: Never   Smokeless tobacco: Never  Vaping Use   Vaping status: Never Used  Substance Use Topics   Alcohol use: Yes    Comment: occasional   Drug use: No     Allergies   Atorvastatin, Ibuprofen, Morphine  sulfate er, Penicillin g, Penicillins, Simvastatin, Tetracycline, and Tetracycline hcl   Review of Systems Review of Systems  All other systems reviewed and are negative.    Physical Exam Triage Vital Signs ED Triage Vitals [01/21/24 1121]  Encounter Vitals Group     BP 117/72     Systolic BP Percentile      Diastolic BP Percentile      Pulse Rate 81     Resp 18     Temp 97.8 F (36.6 C)     Temp Source Oral     SpO2 95 %     Weight      Height      Head Circumference      Peak Flow      Pain Score 0     Pain Loc      Pain Education      Exclude from Growth Chart    No data found.  Updated Vital Signs BP 117/72 (BP Location: Right Arm)   Pulse 81   Temp 97.8 F (36.6 C) (Oral)   Resp 18   SpO2 95%   Visual  Acuity Right Eye Distance:   Left Eye Distance:   Bilateral Distance:    Right Eye Near:   Left Eye Near:    Bilateral Near:     Physical Exam Vitals and nursing note reviewed.  Constitutional:      Appearance: She is well-developed.  HENT:     Head: Normocephalic.  Cardiovascular:     Rate and Rhythm: Normal rate.  Pulmonary:     Effort: Pulmonary effort is normal.  Abdominal:     General: There is no distension.  Musculoskeletal:        General: Normal range of motion.     Cervical back: Normal range of motion.  Skin:    General: Skin is warm.  Neurological:     General: No focal deficit present.     Mental Status: She is alert and oriented to person, place, and time.      UC Treatments / Results  Labs (all labs ordered are listed, but only abnormal results are displayed) Labs Reviewed  POCT URINALYSIS DIP (MANUAL ENTRY) - Abnormal; Notable for the following components:  Result Value   Color, UA other (*)    Blood, UA trace-intact (*)    Urobilinogen, UA 2.0 (*)    All other components within normal limits    EKG   Radiology No results found.  Procedures Procedures (including critical care time)  Medications Ordered in UC Medications - No data to display  Initial Impression / Assessment and Plan / UC Course  I have reviewed the triage vital signs and the nursing notes.  Pertinent labs & imaging results that were available during my care of the patient were reviewed by me and considered in my medical decision making (see chart for details).     UA negative nitrates negative leukocytes.  Patient has a normal exam.  She is advised to continue over-the-counter medications for discomfort return if any problems Final Clinical Impressions(s) / UC Diagnoses   Final diagnoses:  Acute right-sided low back pain without sciatica   Discharge Instructions      Return if any problems.   ED Prescriptions   None    PDMP not reviewed this  encounter. An After Visit Summary was printed and given to the patient.       Sandi Crosby, PA-C 01/21/24 0981

## 2024-02-02 ENCOUNTER — Other Ambulatory Visit: Payer: Self-pay | Admitting: Internal Medicine

## 2024-02-02 ENCOUNTER — Other Ambulatory Visit: Payer: Self-pay

## 2024-02-04 ENCOUNTER — Ambulatory Visit
Admission: RE | Admit: 2024-02-04 | Discharge: 2024-02-04 | Disposition: A | Discharge status: Home / Self Care | Source: Ambulatory Visit

## 2024-02-04 ENCOUNTER — Encounter: Payer: Self-pay | Admitting: Emergency Medicine

## 2024-02-04 DIAGNOSIS — L551 Sunburn of second degree: Secondary | ICD-10-CM

## 2024-02-04 DIAGNOSIS — S80861A Insect bite (nonvenomous), right lower leg, initial encounter: Secondary | ICD-10-CM | POA: Diagnosis not present

## 2024-02-04 DIAGNOSIS — W57XXXA Bitten or stung by nonvenomous insect and other nonvenomous arthropods, initial encounter: Secondary | ICD-10-CM

## 2024-02-04 MED ORDER — DOXYCYCLINE HYCLATE 100 MG PO CAPS: 200.0000 mg | ORAL_CAPSULE | Freq: Once | ORAL | 0 refills | Status: AC

## 2024-02-04 NOTE — ED Provider Notes (Signed)
 [facility]  Note:  This document was prepared using Conservation officer, historic buildings and may include unintentional dictation errors.  MRN: [id number] DOB: 1966-07-28  Subjective:   Pamela Gibson is a 58 y.o. female presenting for evaluation of blistering sunburn to her shoulders patient concerned for possible sun poisoning secondary to sunburn.  Patient denies any fatigue, fever, severe pain.  Patient also reports that she had a tick removed from her right lower leg by her husband and was concern for whether or not the entire tick was removed and if she might get into secondary infection.  Patient denies any pain or inflammation to the area, no erythema migrans, no fever, no drainage from wound.  Patient does not know how long tick was attached but states it is very small and did not think that it had been attached for very long.  No current facility-administered medications for this encounter.  Current Outpatient Medications:    doxycycline (VIBRAMYCIN) 100 MG capsule, Take 2 capsules (200 mg total) by mouth once for 1 dose., Disp: 2 capsule, Rfl: 0   ALPRAZolam  (XANAX ) 1 MG tablet, Take 1 tablet by mouth three times daily as needed, Disp: 90 tablet, Rfl: 2   aspirin 81 MG EC tablet, Take 81 mg by mouth every other day., Disp: , Rfl:    azithromycin  (ZITHROMAX  Z-PAK) 250 MG tablet, Take 2 tablets by mouth on the first day and then 1 tablet by mouth for remaining days. (Patient not taking: Reported on 01/21/2024), Disp: 6 each, Rfl: 0   benzonatate  (TESSALON ) 100 MG capsule, Take 1 capsule (100 mg total) by mouth every 8 (eight) hours. (Patient not taking: Reported on 01/21/2024), Disp: 21 capsule, Rfl: 0   calcium carbonate (TUMS - DOSED IN MG ELEMENTAL CALCIUM) 500 MG chewable tablet, Chew 1-2 tablets by mouth daily as needed for indigestion or heartburn., Disp: , Rfl:    CAPVAXIVE 0.5 ML injection, , Disp: , Rfl:    celecoxib (CELEBREX) 200 MG capsule, Take 200 mg by mouth daily  as needed. (Patient not taking: Reported on 09/06/2023), Disp: , Rfl:    Cholecalciferol (VITAMIN D3) 25 MCG (1000 UT) CAPS, Take 2,000 Units by mouth daily., Disp: , Rfl:    [employer] PURELAX 17 GM/SCOOP powder, Take 0.5 Containers by mouth daily as needed for mild constipation or moderate constipation., Disp: , Rfl:    diclofenac  Sodium (VOLTAREN ) 1 % GEL, Apply 4 g topically., Disp: , Rfl:    gabapentin  (NEURONTIN ) 100 MG capsule, Take 1 capsule (100 mg total) by mouth 3 (three) times daily. (Patient not taking: Reported on 09/06/2023), Disp: 270 capsule, Rfl: 1   lidocaine  (LIDODERM ) 5 %, Place 1-3 patches onto skin as directed. (Patient not taking: Reported on 09/06/2023), Disp: 270 patch, Rfl: 0   lidocaine  (LIDODERM ) 5 %, Place 1 - 3 patches onto the skin as directed. Patches may be worn for up to 12 hours and must be off for 12 hours between uses. (Patient not taking: Reported on 09/06/2023), Disp: 270 patch, Rfl: 0   losartan  (COZAAR ) 25 MG tablet, Take 1 tablet by mouth once daily, Disp: 90 tablet, Rfl: 0   Multiple Vitamins-Minerals (MULTIVITAMIN WITH MINERALS) tablet, Take 1 tablet by mouth daily., Disp: , Rfl:    naloxone  (NARCAN ) nasal spray 4 mg/0.1 mL, Use  1 spray every 3 minutes; spray 1 dose into ONE nostril; alternate nostrils w each dose until help arrives, Disp: 2 each, Rfl: 0   naloxone  (NARCAN ) nasal spray  4 mg/0.1 mL, Spray 1 spray every 3 minutes; spray into ONE nostril; alternate nostrils w each dose until help arrives., Disp: 2 each, Rfl: 0   naloxone  (NARCAN ) nasal spray 4 mg/0.1 mL, Spray 1 dose into ONE nostril every 3 minutes; alternate nostrils with each dose until help arrives, Disp: 2 each, Rfl: 0   naloxone  (NARCAN ) nasal spray 4 mg/0.1 mL, 1 spray every 3 minutes; spray 1 dose into ONE nostril; alternate nostrils w each dose until help arrives, Disp: 2 each, Rfl: 0   naloxone  (NARCAN ) nasal spray 4 mg/0.1 mL, 1 spray every 3 minutes; spray 1 dose into ONE nostril; alternate  nostrils w each dose until help arrives, Disp: 2 each, Rfl: 0   NARCAN  4 MG/0.1ML LIQD nasal spray kit, Place 1 spray into the nose once., Disp: , Rfl:    nystatin  (MYCOSTATIN /NYSTOP ) powder, Apply topically., Disp: , Rfl:    Oxycodone  HCl 10 MG TABS, Take 1 tablet (10 mg total) by mouth 5 (five) times daily., Disp: 150 tablet, Rfl: 0   Oxycodone  HCl 10 MG TABS, Take 1 tablet (10 mg total) by mouth 5 (five) times daily., Disp: 150 tablet, Rfl: 0   Oxycodone  HCl 10 MG TABS, Take 1 tablet (10 mg total) by mouth 5 (five) times daily., Disp: 150 tablet, Rfl: 0   Oxycodone  HCl 10 MG TABS, Take 1 tablet (10 mg total) by mouth 5 (five) times daily as needed., Disp: 150 tablet, Rfl: 0   oxyCODONE -acetaminophen  (PERCOCET) 10-325 MG tablet, Take 1 tablet by mouth every four hours as needed for pain, Disp: 180 tablet, Rfl: 0   oxyCODONE -acetaminophen  (PERCOCET) 10-325 MG tablet, Take 1 tablet by mouth every 4 (four) hours as needed for pain. (Patient not taking: Reported on 01/21/2024), Disp: 180 tablet, Rfl: 0   oxyCODONE -acetaminophen  (PERCOCET) 10-325 MG tablet, Take 1 tablet by mouth every 4 (four) hours as needed., Disp: 42 tablet, Rfl: 0   oxyCODONE -acetaminophen  (PERCOCET) 10-325 MG tablet, Take 1 tablet by mouth every 4 hours as needed, Disp: 180 tablet, Rfl: 0   oxyCODONE -acetaminophen  (PERCOCET) 10-325 MG tablet, Take 1 tablet by mouth every 4 hours as needed., Disp: 180 tablet, Rfl: 0   oxyCODONE -acetaminophen  (PERCOCET) 10-325 MG tablet, Take 1 tablet by mouth five times a day., Disp: 150 tablet, Rfl: 0   oxyCODONE -acetaminophen  (PERCOCET) 10-325 MG tablet, Take 1 tablet by mouth 5 times daily., Disp: 150 tablet, Rfl: 0   oxyCODONE -acetaminophen  (PERCOCET) 10-325 MG tablet, Take 1 tablet by mouth 5 (five) times daily., Disp: 150 tablet, Rfl: 0   oxyCODONE -acetaminophen  (PERCOCET) 10-325 MG tablet, Take 1 tablet by mouth 5 (five) times daily., Disp: 150 tablet, Rfl: 0   pantoprazole  (PROTONIX ) 40 MG  tablet, Take 1 tablet by mouth once daily, Disp: 90 tablet, Rfl: 3   pravastatin  (PRAVACHOL ) 40 MG tablet, Take 1 tablet by mouth once daily, Disp: 90 tablet, Rfl: 3   promethazine -dextromethorphan (PROMETHAZINE -DM) 6.25-15 MG/5ML syrup, Take 5 mLs by mouth 4 (four) times daily as needed. (Patient not taking: Reported on 09/06/2023), Disp: 118 mL, Rfl: 0   sulfamethoxazole -trimethoprim  (BACTRIM  DS) 800-160 MG tablet, Take 1 tablet by mouth 2 times daily (Patient not taking: Reported on 09/06/2023), Disp: 6 tablet, Rfl: 0   traZODone  (DESYREL ) 50 MG tablet, Take 0.5-1 tablets (25-50 mg total) by mouth at bedtime as needed for sleep. (Patient not taking: Reported on 09/06/2023), Disp: 90 tablet, Rfl: 1   triamcinolone  cream (KENALOG ) 0.1 %, Apply to affected area  2 (two) times  daily., Disp: 100 g, Rfl: 0   vitamin B-12 (CYANOCOBALAMIN ) 1000 MCG tablet, Take 1 tablet (1,000 mcg total) by mouth daily. (Patient taking differently: Take 1,000 mcg by mouth 3 (three) times a week.), Disp: 90 tablet, Rfl: 3   Allergies  Allergen Reactions   Atorvastatin     REACTION: more forgetful   Ibuprofen     INSTRUCTED BY MD NOT TO TAKE DUE TO GI UPSET   Morphine  Sulfate Er Other (See Comments)    Chest hurt   Penicillin G Itching   Penicillins     turns blue, childhood allergy Has patient had a PCN reaction causing immediate rash, facial/tongue/throat swelling, SOB or lightheadedness with hypotension: Yes Has patient had a PCN reaction causing severe rash involving mucus membranes or skin necrosis: No Has patient had a PCN reaction that required hospitalization: Unknown  Has patient had a PCN reaction occurring within the last 10 years: No If all of the above answers are NO, then may proceed with Cephalosporin use.    Simvastatin     REACTION: more forgetful   Tetracycline Itching   Tetracycline Hcl Itching    Past Medical History:  Diagnosis Date   Anxiety    BACK PAIN 07/23/2007   Barrett's  esophagus    CHEST PAIN 09/04/2010   Colon adenoma    Cyst of right ovary 07/03/2015   DEGENERATIVE JOINT DISEASE, LUMBAR SPINE 07/23/2007   Depression    Gastric polyp    x3   GERD 07/22/2007   H/O total hysterectomy    HYPERLIPIDEMIA 07/22/2007   IBS 07/23/2007   MITRAL VALVE PROLAPSE 07/22/2007   Pinched nerve    LEFT SIDE, DISC 3,4,5   Pneumonia    YEARS AGO   SINUSITIS- ACUTE-NOS 09/09/2007   TACHYCARDIA 08/20/2010   Tuberculosis     MOTHER HAD YEARS AGO   Unspecified Vaginitis and Vulvovaginitis 12/17/2007     Past Surgical History:  Procedure Laterality Date   ABDOMINAL HYSTERECTOMY     BIOPSY  04/17/2022   Procedure: BIOPSY;  Surgeon: Daina Drum, MD;  Location: [facility];  Service: Gastroenterology;;   CHOLECYSTECTOMY     COLONOSCOPY WITH PROPOFOL  N/A 04/17/2022   Procedure: COLONOSCOPY WITH PROPOFOL ;  Surgeon: Daina Drum, MD;  Location: [facility];  Service: Gastroenterology;  Laterality: N/A;   CYSTOSCOPY  11/11/2017   Procedure: CYSTOSCOPY;  Surgeon: Keene Pastures, DO;  Location: [facility];  Service: Gynecology;;   ESOPHAGOGASTRODUODENOSCOPY (EGD) WITH PROPOFOL  N/A 04/17/2022   Procedure: ESOPHAGOGASTRODUODENOSCOPY (EGD) WITH PROPOFOL ;  Surgeon: Daina Drum, MD;  Location: [facility];  Service: Gastroenterology;  Laterality: N/A;   LAPAROSCOPIC BILATERAL SALPINGO OOPHERECTOMY Bilateral 11/11/2017   Procedure: LAPAROSCOPIC BILATERAL SALPINGO OOPHORECTOMY;  Surgeon: Ozan, Jennifer, DO;  Location: [facility];  Service: Gynecology;  Laterality: Bilateral;   POLYPECTOMY  04/17/2022   Procedure: POLYPECTOMY;  Surgeon: Daina Drum, MD;  Location: [facility];  Service: Gastroenterology;;   RLE tib/fib fx/surgury     TONSILLECTOMY     TUBAL LIGATION      Family History  Problem Relation Age of Onset   Colon polyps Mother    Angina Mother    Heart disease Mother        CVA   Coronary artery disease Mother    Stroke Mother    Hypertension Other     Diabetes Other    Hyperlipidemia Other    Breast cancer Neg Hx    Esophageal cancer Neg Hx    Colon  cancer Neg Hx    Pancreatic cancer Neg Hx    Stomach cancer Neg Hx    Liver disease Neg Hx     Social History   Tobacco Use   Smoking status: Never    Passive exposure: Never   Smokeless tobacco: Never  Vaping Use   Vaping status: Never Used  Substance Use Topics   Alcohol use: Yes    Comment: occasional   Drug use: No    ROS Refer to HPI for ROS details.  Objective:   Vitals: BP (!) 144/73 (BP Location: Left Arm)   Pulse 82   Temp 98.2 F (36.8 C) (Oral)   Resp 18   Wt 229 lb 15 oz (104.3 kg)   SpO2 97%   BMI 38.26 kg/m   Physical Exam Vitals and nursing note reviewed.  Constitutional:      General: She is not in acute distress.    Appearance: Normal appearance. She is well-developed. She is not ill-appearing or toxic-appearing.  HENT:     Head: Normocephalic and atraumatic.   Cardiovascular:     Rate and Rhythm: Normal rate.  Pulmonary:     Effort: Pulmonary effort is normal. No respiratory distress.   Skin:    General: Skin is warm and dry.     Capillary Refill: Capillary refill takes less than 2 seconds.     Findings: Burn and wound present. No bruising, erythema or rash.   Neurological:     General: No focal deficit present.     Mental Status: She is alert and oriented to person, place, and time.   Psychiatric:        Mood and Affect: Mood normal.        Behavior: Behavior normal.     Procedures  No results found for this or any previous visit (from the past 24 hours).  No results found.   Assessment and Plan :     Discharge Instructions       1. Sunburn, blistering (Primary) 2. Tick bite of right lower leg, initial encounter - doxycycline (VIBRAMYCIN) 100 MG capsule; Take 2 capsules (200 mg total) by mouth once for 1 dose.  Dispense: 2 capsule; Refill: 0 - Continue to monitor for any change in severity such as increased pain to  the area, swelling and pain to the joints, fatigue, fever, bull's-eye rash to the back of the leg where tick bite occurred. -Continue to monitor symptoms for any change in severity if there is any escalation of current symptoms or development of new symptoms follow-up in ER for further evaluation and management.       Imanni Burdine B Klohe [place]   Taimur Fier, [place], [place] 02/04/24 1336

## 2024-02-04 NOTE — Discharge Instructions (Addendum)
  1. Sunburn, blistering (Primary) 2. Tick bite of right lower leg, initial encounter - doxycycline (VIBRAMYCIN) 100 MG capsule; Take 2 capsules (200 mg total) by mouth once for 1 dose.  Dispense: 2 capsule; Refill: 0 - Continue to monitor for any change in severity such as increased pain to the area, swelling and pain to the joints, fatigue, fever, bull's-eye rash to the back of the leg where tick bite occurred. -Continue to monitor symptoms for any change in severity if there is any escalation of current symptoms or development of new symptoms follow-up in ER for further evaluation and management.

## 2024-02-04 NOTE — ED Triage Notes (Addendum)
 Pt presents c/o tick bite on back of her right calf. Husband removed the tick but pt wants to be sure all of it was removed. Pt also c/o sunburn blisters that were on both her arms. She bursted the blisters and has no pain but wants to be sure everything is okay. Lastly, pt is wondering if we can remove a skin tag on that is bothering on her back.

## 2024-02-15 ENCOUNTER — Other Ambulatory Visit (HOSPITAL_COMMUNITY): Payer: Self-pay

## 2024-02-15 DIAGNOSIS — M5416 Radiculopathy, lumbar region: Secondary | ICD-10-CM | POA: Diagnosis not present

## 2024-02-15 DIAGNOSIS — I10 Essential (primary) hypertension: Secondary | ICD-10-CM | POA: Diagnosis not present

## 2024-02-15 DIAGNOSIS — M48061 Spinal stenosis, lumbar region without neurogenic claudication: Secondary | ICD-10-CM | POA: Diagnosis not present

## 2024-02-15 DIAGNOSIS — Z79899 Other long term (current) drug therapy: Secondary | ICD-10-CM | POA: Diagnosis not present

## 2024-02-15 DIAGNOSIS — Z9181 History of falling: Secondary | ICD-10-CM | POA: Diagnosis not present

## 2024-02-15 DIAGNOSIS — E6609 Other obesity due to excess calories: Secondary | ICD-10-CM | POA: Diagnosis not present

## 2024-02-15 MED ORDER — OXYCODONE HCL 10 MG PO TABS
10.0000 mg | ORAL_TABLET | Freq: Every day | ORAL | 0 refills | Status: DC
  Filled 2024-02-15: qty 150, 30d supply, fill #0

## 2024-02-19 ENCOUNTER — Encounter: Payer: Self-pay | Admitting: Internal Medicine

## 2024-02-19 ENCOUNTER — Ambulatory Visit: Payer: Self-pay | Admitting: Internal Medicine

## 2024-02-19 ENCOUNTER — Ambulatory Visit (INDEPENDENT_AMBULATORY_CARE_PROVIDER_SITE_OTHER): Payer: Medicaid Other | Admitting: Internal Medicine

## 2024-02-19 VITALS — BP 122/78 | HR 70 | Temp 98.4°F | Ht 65.0 in | Wt 232.0 lb

## 2024-02-19 DIAGNOSIS — E559 Vitamin D deficiency, unspecified: Secondary | ICD-10-CM | POA: Diagnosis not present

## 2024-02-19 DIAGNOSIS — E538 Deficiency of other specified B group vitamins: Secondary | ICD-10-CM | POA: Diagnosis not present

## 2024-02-19 DIAGNOSIS — R739 Hyperglycemia, unspecified: Secondary | ICD-10-CM | POA: Diagnosis not present

## 2024-02-19 DIAGNOSIS — I10 Essential (primary) hypertension: Secondary | ICD-10-CM

## 2024-02-19 DIAGNOSIS — E7800 Pure hypercholesterolemia, unspecified: Secondary | ICD-10-CM

## 2024-02-19 LAB — LIPID PANEL
Cholesterol: 165 mg/dL (ref 0–200)
HDL: 48.9 mg/dL (ref >39.00)
LDL Cholesterol: 91 mg/dL (ref 0–99)
NonHDL: 116.36
Total CHOL/HDL Ratio: 3
Triglycerides: 126 mg/dL (ref 0.0–149.0)
VLDL: 25.2 mg/dL (ref 0.0–40.0)

## 2024-02-19 LAB — URINALYSIS, ROUTINE W REFLEX MICROSCOPIC
Bilirubin Urine: NEGATIVE
Ketones, ur: NEGATIVE
Nitrite: NEGATIVE
Specific Gravity, Urine: 1.01 (ref 1.000–1.030)
Total Protein, Urine: NEGATIVE
Urine Glucose: NEGATIVE
Urobilinogen, UA: 1 (ref 0.0–1.0)
pH: 6 (ref 5.0–8.0)

## 2024-02-19 LAB — CBC WITH DIFFERENTIAL/PLATELET
Basophils Absolute: 0.1 10*3/uL (ref 0.0–0.1)
Basophils Relative: 0.7 % (ref 0.0–3.0)
Eosinophils Absolute: 0.3 10*3/uL (ref 0.0–0.7)
Eosinophils Relative: 3.6 % (ref 0.0–5.0)
HCT: 46.7 % — ABNORMAL HIGH (ref 36.0–46.0)
Hemoglobin: 15.8 g/dL — ABNORMAL HIGH (ref 12.0–15.0)
Lymphocytes Relative: 28.8 % (ref 12.0–46.0)
Lymphs Abs: 2.1 10*3/uL (ref 0.7–4.0)
MCHC: 33.8 g/dL (ref 30.0–36.0)
MCV: 87.3 fl (ref 78.0–100.0)
Monocytes Absolute: 1 10*3/uL (ref 0.1–1.0)
Monocytes Relative: 13.5 % — ABNORMAL HIGH (ref 3.0–12.0)
Neutro Abs: 3.9 10*3/uL (ref 1.4–7.7)
Neutrophils Relative %: 53.4 % (ref 43.0–77.0)
Platelets: 259 10*3/uL (ref 150.0–400.0)
RBC: 5.34 Mil/uL — ABNORMAL HIGH (ref 3.87–5.11)
RDW: 12.9 % (ref 11.5–15.5)
WBC: 7.4 10*3/uL (ref 4.0–10.5)

## 2024-02-19 LAB — BASIC METABOLIC PANEL WITH GFR
BUN: 15 mg/dL (ref 6–23)
CO2: 29 meq/L (ref 19–32)
Calcium: 9.8 mg/dL (ref 8.4–10.5)
Chloride: 102 meq/L (ref 96–112)
Creatinine, Ser: 0.96 mg/dL (ref 0.40–1.20)
GFR: 65.12 mL/min (ref >60.00)
Glucose, Bld: 100 mg/dL — ABNORMAL HIGH (ref 70–99)
Potassium: 4.1 meq/L (ref 3.5–5.1)
Sodium: 139 meq/L (ref 135–145)

## 2024-02-19 LAB — VITAMIN B12: Vitamin B-12: 687 pg/mL (ref 211–911)

## 2024-02-19 LAB — HEPATIC FUNCTION PANEL
ALT: 33 U/L (ref 0–35)
AST: 27 U/L (ref 0–37)
Albumin: 4.3 g/dL (ref 3.5–5.2)
Alkaline Phosphatase: 55 U/L (ref 39–117)
Bilirubin, Direct: 0.2 mg/dL (ref 0.0–0.3)
Total Bilirubin: 0.7 mg/dL (ref 0.2–1.2)
Total Protein: 7.3 g/dL (ref 6.0–8.3)

## 2024-02-19 LAB — VITAMIN D 25 HYDROXY (VIT D DEFICIENCY, FRACTURES): VITD: 53.19 ng/mL (ref 30.00–100.00)

## 2024-02-19 LAB — HEMOGLOBIN A1C: Hgb A1c MFr Bld: 5.9 % (ref 4.6–6.5)

## 2024-02-19 LAB — TSH: TSH: 1.65 u[IU]/mL (ref 0.35–5.50)

## 2024-02-19 NOTE — Patient Instructions (Signed)

## 2024-02-19 NOTE — Assessment & Plan Note (Signed)
 Last vitamin D  Lab Results  Component Value Date   VD25OH 53.19 02/19/2024   Stable, cont oral replacement

## 2024-02-19 NOTE — Assessment & Plan Note (Signed)
 BP Readings from Last 3 Encounters:  02/19/24 122/78  02/04/24 (!) 144/73  01/21/24 117/72   Stable, pt to continue medical treatment losartan  25 mg qd

## 2024-02-19 NOTE — Assessment & Plan Note (Signed)
 Lab Results  Component Value Date   HGBA1C 5.9 02/19/2024   Stable, pt to continue current medical treatment  - diet, wt control

## 2024-02-19 NOTE — Progress Notes (Signed)
 The test results show that your current treatment is OK, as the tests are stable.  Please continue the same plan.  There is no other need for change of treatment or further evaluation based on these results, at this time.  thanks

## 2024-02-19 NOTE — Assessment & Plan Note (Signed)
 Lab Results  Component Value Date   VITAMINB12 687 02/19/2024   Stable, cont oral replacement - b12 1000 mcg qd

## 2024-02-19 NOTE — Assessment & Plan Note (Signed)
 Lab Results  Component Value Date   LDLCALC 91 02/19/2024   uncontrolled, pt to continue current statin pravachol  40 mg every day as declines other change

## 2024-02-19 NOTE — Progress Notes (Signed)
 Patient ID: Pamela Gibson, female   DOB: 1965-09-06, 58 y.o.   MRN: [id number]         Chief Complaint:: yearly exam and Medical Management of Chronic Issues (6 month follow up, coughing up phlegm the past couple of days )  , low vit d, low b12, hld, hyperglycemia, htn       HPI:  Pamela Gibson is a 58 y.o. female here overall doing ok; husband has been ill x 2 wks with asthmatic bronchitis, and now she herself seems have scant prod cough x 2 days but Pt denies chest pain, increased sob or doe, wheezing, orthopnea, PND, increased LE swelling, palpitations, dizziness or syncope.    Pt denies fever, wt loss, night sweats, loss of appetite, or other constitutional symptoms   Pt denies polydipsia, polyuria, or new focal neuro s/s.     Wt Readings from Last 3 Encounters:  02/19/24 232 lb (105.2 kg)  02/04/24 229 lb 15 oz (104.3 kg)  01/10/24 229 lb 15 oz (104.3 kg)   BP Readings from Last 3 Encounters:  02/19/24 122/78  02/04/24 (!) 144/73  01/21/24 117/72   Immunization History  Administered Date(s) Administered   Influenza Inj Mdck Quad Pf 04/18/2018   Influenza Whole 07/23/2007, 08/02/2009   Influenza, Seasonal, Injecte, Preservative Fre 05/06/2023   Influenza,inj,Quad PF,6+ Mos 04/17/2017, 04/20/2019, 05/05/2020, 05/05/2021   Influenza-Unspecified 05/31/2015, 05/28/2016, 05/17/2017, 04/20/2019, 05/05/2020, 05/15/2022   PFIZER(Purple Top)SARS-COV-2 Vaccination 12/03/2019, 12/26/2019, 08/10/2020   PNEUMOCOCCAL CONJUGATE-20 01/04/2024   PPD Test 03/21/2013, 03/21/2014   Pfizer Covid-19 Vaccine Bivalent Booster 40yrs & up 06/03/2021   Td 08/26/2003   Tdap 12/02/2013   Zoster Recombinant(Shingrix) 02/13/2021, 07/09/2021   Health Maintenance Due  Topic Date Due   Hepatitis B Vaccines (1 of 3 - 19+ 3-dose series) Never done   DTaP/Tdap/Td (3 - Td or Tdap) 12/03/2023      Past Medical History:  Diagnosis Date   Anxiety    BACK PAIN 07/23/2007   Barrett's esophagus    CHEST PAIN  09/04/2010   Colon adenoma    Cyst of right ovary 07/03/2015   DEGENERATIVE JOINT DISEASE, LUMBAR SPINE 07/23/2007   Depression    Gastric polyp    x3   GERD 07/22/2007   H/O total hysterectomy    HYPERLIPIDEMIA 07/22/2007   IBS 07/23/2007   MITRAL VALVE PROLAPSE 07/22/2007   Pinched nerve    LEFT SIDE, DISC 3,4,5   Pneumonia    YEARS AGO   SINUSITIS- ACUTE-NOS 09/09/2007   TACHYCARDIA 08/20/2010   Tuberculosis     MOTHER HAD YEARS AGO   Unspecified Vaginitis and Vulvovaginitis 12/17/2007   Past Surgical History:  Procedure Laterality Date   ABDOMINAL HYSTERECTOMY     BIOPSY  04/17/2022   Procedure: BIOPSY;  Surgeon: Federico Rosario BROCKS, MD;  Location: [facility];  Service: Gastroenterology;;   CHOLECYSTECTOMY     COLONOSCOPY WITH PROPOFOL  N/A 04/17/2022   Procedure: COLONOSCOPY WITH PROPOFOL ;  Surgeon: Federico Rosario BROCKS, MD;  Location: [facility];  Service: Gastroenterology;  Laterality: N/A;   CYSTOSCOPY  11/11/2017   Procedure: CYSTOSCOPY;  Surgeon: Marilynn Nest, DO;  Location: [facility];  Service: Gynecology;;   ESOPHAGOGASTRODUODENOSCOPY (EGD) WITH PROPOFOL  N/A 04/17/2022   Procedure: ESOPHAGOGASTRODUODENOSCOPY (EGD) WITH PROPOFOL ;  Surgeon: Federico Rosario BROCKS, MD;  Location: [facility];  Service: Gastroenterology;  Laterality: N/A;   LAPAROSCOPIC BILATERAL SALPINGO OOPHERECTOMY Bilateral 11/11/2017   Procedure: LAPAROSCOPIC BILATERAL SALPINGO OOPHORECTOMY;  Surgeon: Ozan, Jennifer, DO;  Location: [facility];  Service: Gynecology;  Laterality: Bilateral;   POLYPECTOMY  04/17/2022   Procedure: POLYPECTOMY;  Surgeon: Federico Rosario BROCKS, MD;  Location: [facility];  Service: Gastroenterology;;   RLE tib/fib fx/surgury     TONSILLECTOMY     TUBAL LIGATION      reports that she has never smoked. She has never been exposed to tobacco smoke. She has never used smokeless tobacco. She reports current alcohol use. She reports that she does not use drugs. family history includes Angina in her  mother; Colon polyps in her mother; Coronary artery disease in her mother; Diabetes in an other family member; Heart disease in her mother; Hyperlipidemia in an other family member; Hypertension in an other family member; Stroke in her mother. Allergies  Allergen Reactions   Atorvastatin     REACTION: more forgetful   Ibuprofen     INSTRUCTED BY MD NOT TO TAKE DUE TO GI UPSET   Morphine  Sulfate Er Other (See Comments)    Chest hurt   Penicillin G Itching   Penicillins     turns blue, childhood allergy Has patient had a PCN reaction causing immediate rash, facial/tongue/throat swelling, SOB or lightheadedness with hypotension: Yes Has patient had a PCN reaction causing severe rash involving mucus membranes or skin necrosis: No Has patient had a PCN reaction that required hospitalization: Unknown  Has patient had a PCN reaction occurring within the last 10 years: No If all of the above answers are NO, then may proceed with Cephalosporin use.    Simvastatin     REACTION: more forgetful   Tetracycline Itching   Tetracycline Hcl Itching   Current Outpatient Medications on File Prior to Visit  Medication Sig Dispense Refill   ALPRAZolam  (XANAX ) 1 MG tablet Take 1 tablet by mouth three times daily as needed 90 tablet 2   aspirin 81 MG EC tablet Take 81 mg by mouth every other day.     azithromycin  (ZITHROMAX  Z-PAK) 250 MG tablet Take 2 tablets by mouth on the first day and then 1 tablet by mouth for remaining days. (Patient not taking: Reported on 01/21/2024) 6 each 0   benzonatate  (TESSALON ) 100 MG capsule Take 1 capsule (100 mg total) by mouth every 8 (eight) hours. (Patient not taking: Reported on 01/21/2024) 21 capsule 0   calcium carbonate (TUMS - DOSED IN MG ELEMENTAL CALCIUM) 500 MG chewable tablet Chew 1-2 tablets by mouth daily as needed for indigestion or heartburn.     CAPVAXIVE 0.5 ML injection      celecoxib (CELEBREX) 200 MG capsule Take 200 mg by mouth daily as needed. (Patient  not taking: Reported on 09/06/2023)     Cholecalciferol (VITAMIN D3) 25 MCG (1000 UT) CAPS Take 2,000 Units by mouth daily.     [employer] PURELAX 17 GM/SCOOP powder Take 0.5 Containers by mouth daily as needed for mild constipation or moderate constipation.     diclofenac  Sodium (VOLTAREN ) 1 % GEL Apply 4 g topically.     gabapentin  (NEURONTIN ) 100 MG capsule Take 1 capsule (100 mg total) by mouth 3 (three) times daily. (Patient not taking: Reported on 09/06/2023) 270 capsule 1   lidocaine  (LIDODERM ) 5 % Place 1-3 patches onto skin as directed. (Patient not taking: Reported on 09/06/2023) 270 patch 0   lidocaine  (LIDODERM ) 5 % Place 1 - 3 patches onto the skin as directed. Patches may be worn for up to 12 hours and must be off for 12 hours between uses. (Patient not taking: Reported on 09/06/2023)  270 patch 0   losartan  (COZAAR ) 25 MG tablet Take 1 tablet by mouth once daily 90 tablet 0   Multiple Vitamins-Minerals (MULTIVITAMIN WITH MINERALS) tablet Take 1 tablet by mouth daily.     naloxone  (NARCAN ) nasal spray 4 mg/0.1 mL Use  1 spray every 3 minutes; spray 1 dose into ONE nostril; alternate nostrils w each dose until help arrives 2 each 0   naloxone  (NARCAN ) nasal spray 4 mg/0.1 mL Spray 1 spray every 3 minutes; spray into ONE nostril; alternate nostrils w each dose until help arrives. 2 each 0   naloxone  (NARCAN ) nasal spray 4 mg/0.1 mL Spray 1 dose into ONE nostril every 3 minutes; alternate nostrils with each dose until help arrives 2 each 0   naloxone  (NARCAN ) nasal spray 4 mg/0.1 mL 1 spray every 3 minutes; spray 1 dose into ONE nostril; alternate nostrils w each dose until help arrives 2 each 0   naloxone  (NARCAN ) nasal spray 4 mg/0.1 mL 1 spray every 3 minutes; spray 1 dose into ONE nostril; alternate nostrils w each dose until help arrives 2 each 0   NARCAN  4 MG/0.1ML LIQD nasal spray kit Place 1 spray into the nose once.     nystatin  (MYCOSTATIN /NYSTOP ) powder Apply topically.     Oxycodone  HCl 10  MG TABS Take 1 tablet (10 mg total) by mouth 5 (five) times daily. 150 tablet 0   Oxycodone  HCl 10 MG TABS Take 1 tablet (10 mg total) by mouth 5 (five) times daily. 150 tablet 0   Oxycodone  HCl 10 MG TABS Take 1 tablet (10 mg total) by mouth 5 (five) times daily. 150 tablet 0   Oxycodone  HCl 10 MG TABS Take 1 tablet (10 mg total) by mouth 5 (five) times daily as needed. 150 tablet 0   Oxycodone  HCl 10 MG TABS Take 1 tablet (10 mg total) by mouth 5 (five) times daily. 150 tablet 0   oxyCODONE -acetaminophen  (PERCOCET) 10-325 MG tablet Take 1 tablet by mouth every four hours as needed for pain 180 tablet 0   oxyCODONE -acetaminophen  (PERCOCET) 10-325 MG tablet Take 1 tablet by mouth every 4 (four) hours as needed for pain. (Patient not taking: Reported on 01/21/2024) 180 tablet 0   oxyCODONE -acetaminophen  (PERCOCET) 10-325 MG tablet Take 1 tablet by mouth every 4 (four) hours as needed. 42 tablet 0   oxyCODONE -acetaminophen  (PERCOCET) 10-325 MG tablet Take 1 tablet by mouth every 4 hours as needed 180 tablet 0   oxyCODONE -acetaminophen  (PERCOCET) 10-325 MG tablet Take 1 tablet by mouth every 4 hours as needed. 180 tablet 0   oxyCODONE -acetaminophen  (PERCOCET) 10-325 MG tablet Take 1 tablet by mouth five times a day. 150 tablet 0   oxyCODONE -acetaminophen  (PERCOCET) 10-325 MG tablet Take 1 tablet by mouth 5 times daily. 150 tablet 0   oxyCODONE -acetaminophen  (PERCOCET) 10-325 MG tablet Take 1 tablet by mouth 5 (five) times daily. 150 tablet 0   oxyCODONE -acetaminophen  (PERCOCET) 10-325 MG tablet Take 1 tablet by mouth 5 (five) times daily. 150 tablet 0   pantoprazole  (PROTONIX ) 40 MG tablet Take 1 tablet by mouth once daily 90 tablet 3   pravastatin  (PRAVACHOL ) 40 MG tablet Take 1 tablet by mouth once daily 90 tablet 3   promethazine -dextromethorphan (PROMETHAZINE -DM) 6.25-15 MG/5ML syrup Take 5 mLs by mouth 4 (four) times daily as needed. (Patient not taking: Reported on 09/06/2023) 118 mL 0    sulfamethoxazole -trimethoprim  (BACTRIM  DS) 800-160 MG tablet Take 1 tablet by mouth 2 times daily (Patient not taking: Reported on  09/06/2023) 6 tablet 0   traZODone  (DESYREL ) 50 MG tablet Take 0.5-1 tablets (25-50 mg total) by mouth at bedtime as needed for sleep. (Patient not taking: Reported on 09/06/2023) 90 tablet 1   triamcinolone  cream (KENALOG ) 0.1 % Apply to affected area  2 (two) times daily. 100 g 0   vitamin B-12 (CYANOCOBALAMIN ) 1000 MCG tablet Take 1 tablet (1,000 mcg total) by mouth daily. (Patient taking differently: Take 1,000 mcg by mouth 3 (three) times a week.) 90 tablet 3   No current facility-administered medications on file prior to visit.        ROS:  All others reviewed and negative.  Objective        PE:  BP 122/78 (BP Location: Right Arm, Patient Position: Sitting, Cuff Size: Normal)   Pulse 70   Temp 98.4 F (36.9 C) (Oral)   Ht 5' 5 (1.651 m)   Wt 232 lb (105.2 kg)   SpO2 95%   BMI 38.61 kg/m                 Constitutional: Pt appears in NAD               HENT: Head: NCAT.                Right Ear: External ear normal.                 Left Ear: External ear normal.                Eyes: . Pupils are equal, round, and reactive to light. Conjunctivae and EOM are normal               Nose: without d/c or deformity               Neck: Neck supple. Gross normal ROM               Cardiovascular: Normal rate and regular rhythm.                 Pulmonary/Chest: Effort normal and breath sounds without rales or wheezing.                Abd:  Soft, NT, ND, + BS, no organomegaly               Neurological: Pt is alert. At baseline orientation, motor grossly intact               Skin: Skin is warm. No rashes, no other new lesions, LE edema - none               Psychiatric: Pt behavior is normal without agitation   Micro: none  Cardiac tracings I have personally interpreted today:  none  Pertinent Radiological findings (summarize): none   Lab Results  Component  Value Date   WBC 7.4 02/19/2024   HGB 15.8 (H) 02/19/2024   HCT 46.7 (H) 02/19/2024   PLT 259.0 02/19/2024   GLUCOSE 100 (H) 02/19/2024   CHOL 165 02/19/2024   TRIG 126.0 02/19/2024   HDL 48.90 02/19/2024   LDLDIRECT 128.8 07/18/2008   LDLCALC 91 02/19/2024   ALT 33 02/19/2024   AST 27 02/19/2024   NA 139 02/19/2024   K 4.1 02/19/2024   CL 102 02/19/2024   CREATININE 0.96 02/19/2024   BUN 15 02/19/2024   CO2 29 02/19/2024   TSH 1.65 02/19/2024   HGBA1C 5.9 02/19/2024   MICROALBUR <0.7 02/19/2023   Assessment/Plan:  Pamela Gibson is a 58 y.o. American [place] or [place]  Native [3] female with  has a past medical history of Anxiety, BACK PAIN (07/23/2007), Barrett's esophagus, CHEST PAIN (09/04/2010), Colon adenoma, Cyst of right ovary (07/03/2015), DEGENERATIVE JOINT DISEASE, LUMBAR SPINE (07/23/2007), Depression, Gastric polyp, GERD (07/22/2007), H/O total hysterectomy, HYPERLIPIDEMIA (07/22/2007), IBS (07/23/2007), MITRAL VALVE PROLAPSE (07/22/2007), Pinched nerve, Pneumonia, SINUSITIS- ACUTE-NOS (09/09/2007), TACHYCARDIA (08/20/2010), Tuberculosis, and Unspecified Vaginitis and Vulvovaginitis (12/17/2007).  Vitamin D  deficiency Last vitamin D  Lab Results  Component Value Date   VD25OH 53.19 02/19/2024   Stable, cont oral replacement   Vitamin B12 deficiency Lab Results  Component Value Date   VITAMINB12 687 02/19/2024   Stable, cont oral replacement - b12 1000 mcg qd   Hyperlipidemia Lab Results  Component Value Date   LDLCALC 91 02/19/2024   uncontrolled, pt to continue current statin pravachol  40 mg every day as declines other change   Hyperglycemia Lab Results  Component Value Date   HGBA1C 5.9 02/19/2024   Stable, pt to continue current medical treatment  - diet, wt control   HTN (hypertension) BP Readings from Last 3 Encounters:  02/19/24 122/78  02/04/24 (!) 144/73  01/21/24 117/72   Stable, pt to continue medical treatment losartan  25 mg  qd  Followup: Return in about 6 months (around 08/20/2024).  Lynwood Rush, MD 02/19/2024 1:17 PM [facility]

## 2024-02-24 DIAGNOSIS — K219 Gastro-esophageal reflux disease without esophagitis: Secondary | ICD-10-CM | POA: Diagnosis not present

## 2024-02-24 DIAGNOSIS — Z6839 Body mass index (BMI) 39.0-39.9, adult: Secondary | ICD-10-CM | POA: Diagnosis not present

## 2024-02-24 DIAGNOSIS — G4710 Hypersomnia, unspecified: Secondary | ICD-10-CM | POA: Diagnosis not present

## 2024-02-24 DIAGNOSIS — E6609 Other obesity due to excess calories: Secondary | ICD-10-CM | POA: Diagnosis not present

## 2024-02-24 DIAGNOSIS — M5416 Radiculopathy, lumbar region: Secondary | ICD-10-CM | POA: Diagnosis not present

## 2024-02-24 DIAGNOSIS — M48061 Spinal stenosis, lumbar region without neurogenic claudication: Secondary | ICD-10-CM | POA: Diagnosis not present

## 2024-02-24 DIAGNOSIS — I10 Essential (primary) hypertension: Secondary | ICD-10-CM | POA: Diagnosis not present

## 2024-03-16 ENCOUNTER — Other Ambulatory Visit (HOSPITAL_COMMUNITY): Payer: Self-pay

## 2024-03-16 DIAGNOSIS — R0602 Shortness of breath: Secondary | ICD-10-CM | POA: Diagnosis not present

## 2024-03-16 DIAGNOSIS — E6609 Other obesity due to excess calories: Secondary | ICD-10-CM | POA: Diagnosis not present

## 2024-03-16 DIAGNOSIS — Z1231 Encounter for screening mammogram for malignant neoplasm of breast: Secondary | ICD-10-CM | POA: Diagnosis not present

## 2024-03-16 DIAGNOSIS — I10 Essential (primary) hypertension: Secondary | ICD-10-CM | POA: Diagnosis not present

## 2024-03-16 DIAGNOSIS — Z79899 Other long term (current) drug therapy: Secondary | ICD-10-CM | POA: Diagnosis not present

## 2024-03-16 DIAGNOSIS — R7303 Prediabetes: Secondary | ICD-10-CM | POA: Diagnosis not present

## 2024-03-16 DIAGNOSIS — M5416 Radiculopathy, lumbar region: Secondary | ICD-10-CM | POA: Diagnosis not present

## 2024-03-16 DIAGNOSIS — M48061 Spinal stenosis, lumbar region without neurogenic claudication: Secondary | ICD-10-CM | POA: Diagnosis not present

## 2024-03-16 DIAGNOSIS — Z9181 History of falling: Secondary | ICD-10-CM | POA: Diagnosis not present

## 2024-03-16 MED ORDER — NALOXONE HCL 4 MG/0.1ML NA LIQD
NASAL | 0 refills | Status: AC
  Filled 2024-03-16: qty 2, 1d supply, fill #0

## 2024-03-16 MED ORDER — OXYCODONE HCL 10 MG PO TABS
10.0000 mg | ORAL_TABLET | Freq: Every day | ORAL | 0 refills | Status: DC
  Filled 2024-03-16: qty 150, 30d supply, fill #0

## 2024-03-20 ENCOUNTER — Other Ambulatory Visit: Payer: Self-pay | Admitting: Internal Medicine

## 2024-04-01 ENCOUNTER — Other Ambulatory Visit: Payer: Self-pay | Admitting: Internal Medicine

## 2024-04-01 DIAGNOSIS — Z0000 Encounter for general adult medical examination without abnormal findings: Secondary | ICD-10-CM

## 2024-04-05 ENCOUNTER — Encounter (INDEPENDENT_AMBULATORY_CARE_PROVIDER_SITE_OTHER): Admitting: Ophthalmology

## 2024-04-05 DIAGNOSIS — I10 Essential (primary) hypertension: Secondary | ICD-10-CM

## 2024-04-05 DIAGNOSIS — H34811 Central retinal vein occlusion, right eye, with macular edema: Secondary | ICD-10-CM | POA: Diagnosis not present

## 2024-04-05 DIAGNOSIS — H35033 Hypertensive retinopathy, bilateral: Secondary | ICD-10-CM

## 2024-04-05 DIAGNOSIS — H43813 Vitreous degeneration, bilateral: Secondary | ICD-10-CM

## 2024-04-15 ENCOUNTER — Other Ambulatory Visit (HOSPITAL_COMMUNITY): Payer: Self-pay

## 2024-04-15 DIAGNOSIS — R29818 Other symptoms and signs involving the nervous system: Secondary | ICD-10-CM | POA: Diagnosis not present

## 2024-04-15 DIAGNOSIS — E6609 Other obesity due to excess calories: Secondary | ICD-10-CM | POA: Diagnosis not present

## 2024-04-15 DIAGNOSIS — Z79899 Other long term (current) drug therapy: Secondary | ICD-10-CM | POA: Diagnosis not present

## 2024-04-15 DIAGNOSIS — M5416 Radiculopathy, lumbar region: Secondary | ICD-10-CM | POA: Diagnosis not present

## 2024-04-15 DIAGNOSIS — M48061 Spinal stenosis, lumbar region without neurogenic claudication: Secondary | ICD-10-CM | POA: Diagnosis not present

## 2024-04-15 DIAGNOSIS — Z9181 History of falling: Secondary | ICD-10-CM | POA: Diagnosis not present

## 2024-04-15 DIAGNOSIS — I10 Essential (primary) hypertension: Secondary | ICD-10-CM | POA: Diagnosis not present

## 2024-04-15 MED ORDER — OXYCODONE HCL 10 MG PO TABS
10.0000 mg | ORAL_TABLET | Freq: Every day | ORAL | 0 refills | Status: DC
  Filled 2024-04-15: qty 150, 30d supply, fill #0

## 2024-04-18 ENCOUNTER — Ambulatory Visit
Admission: RE | Admit: 2024-04-18 | Discharge: 2024-04-18 | Disposition: A | Discharge status: Home / Self Care | Source: Ambulatory Visit | Attending: Internal Medicine | Admitting: Internal Medicine

## 2024-04-18 DIAGNOSIS — Z0000 Encounter for general adult medical examination without abnormal findings: Secondary | ICD-10-CM

## 2024-04-18 DIAGNOSIS — Z1231 Encounter for screening mammogram for malignant neoplasm of breast: Secondary | ICD-10-CM | POA: Diagnosis not present

## 2024-04-19 ENCOUNTER — Other Ambulatory Visit: Payer: Self-pay | Admitting: Internal Medicine

## 2024-04-22 DIAGNOSIS — Z79899 Other long term (current) drug therapy: Secondary | ICD-10-CM | POA: Diagnosis not present

## 2024-04-28 ENCOUNTER — Other Ambulatory Visit: Payer: Self-pay | Admitting: Internal Medicine

## 2024-05-17 ENCOUNTER — Other Ambulatory Visit (HOSPITAL_COMMUNITY): Payer: Self-pay

## 2024-05-17 ENCOUNTER — Other Ambulatory Visit: Payer: Self-pay

## 2024-05-17 DIAGNOSIS — Z1231 Encounter for screening mammogram for malignant neoplasm of breast: Secondary | ICD-10-CM | POA: Diagnosis not present

## 2024-05-17 DIAGNOSIS — Z1159 Encounter for screening for other viral diseases: Secondary | ICD-10-CM | POA: Diagnosis not present

## 2024-05-17 DIAGNOSIS — R5383 Other fatigue: Secondary | ICD-10-CM | POA: Diagnosis not present

## 2024-05-17 DIAGNOSIS — Z9181 History of falling: Secondary | ICD-10-CM | POA: Diagnosis not present

## 2024-05-17 DIAGNOSIS — E559 Vitamin D deficiency, unspecified: Secondary | ICD-10-CM | POA: Diagnosis not present

## 2024-05-17 DIAGNOSIS — M129 Arthropathy, unspecified: Secondary | ICD-10-CM | POA: Diagnosis not present

## 2024-05-17 DIAGNOSIS — Z79899 Other long term (current) drug therapy: Secondary | ICD-10-CM | POA: Diagnosis not present

## 2024-05-17 DIAGNOSIS — I10 Essential (primary) hypertension: Secondary | ICD-10-CM | POA: Diagnosis not present

## 2024-05-17 DIAGNOSIS — E7800 Pure hypercholesterolemia, unspecified: Secondary | ICD-10-CM | POA: Diagnosis not present

## 2024-05-17 DIAGNOSIS — M48061 Spinal stenosis, lumbar region without neurogenic claudication: Secondary | ICD-10-CM | POA: Diagnosis not present

## 2024-05-17 DIAGNOSIS — E6609 Other obesity due to excess calories: Secondary | ICD-10-CM | POA: Diagnosis not present

## 2024-05-17 DIAGNOSIS — M5416 Radiculopathy, lumbar region: Secondary | ICD-10-CM | POA: Diagnosis not present

## 2024-05-17 DIAGNOSIS — R7303 Prediabetes: Secondary | ICD-10-CM | POA: Diagnosis not present

## 2024-05-17 DIAGNOSIS — D539 Nutritional anemia, unspecified: Secondary | ICD-10-CM | POA: Diagnosis not present

## 2024-05-17 MED ORDER — NALOXONE HCL 4 MG/0.1ML NA LIQD
1.0000 | NASAL | 0 refills | Status: AC
  Filled 2024-05-17: qty 2, 1d supply, fill #0

## 2024-05-17 MED ORDER — OXYCODONE HCL 10 MG PO TABS
10.0000 mg | ORAL_TABLET | Freq: Every day | ORAL | 0 refills | Status: AC
  Filled 2024-05-17: qty 150, 30d supply, fill #0

## 2024-05-24 DIAGNOSIS — Z79899 Other long term (current) drug therapy: Secondary | ICD-10-CM | POA: Diagnosis not present

## 2024-05-27 ENCOUNTER — Other Ambulatory Visit (HOSPITAL_COMMUNITY): Payer: Self-pay

## 2024-06-07 ENCOUNTER — Other Ambulatory Visit: Payer: Self-pay | Admitting: Internal Medicine

## 2024-06-15 DIAGNOSIS — Z79899 Other long term (current) drug therapy: Secondary | ICD-10-CM | POA: Diagnosis not present

## 2024-06-15 DIAGNOSIS — M48061 Spinal stenosis, lumbar region without neurogenic claudication: Secondary | ICD-10-CM | POA: Diagnosis not present

## 2024-06-15 DIAGNOSIS — R7303 Prediabetes: Secondary | ICD-10-CM | POA: Diagnosis not present

## 2024-06-15 DIAGNOSIS — E6609 Other obesity due to excess calories: Secondary | ICD-10-CM | POA: Diagnosis not present

## 2024-06-15 DIAGNOSIS — Z9181 History of falling: Secondary | ICD-10-CM | POA: Diagnosis not present

## 2024-06-15 DIAGNOSIS — I10 Essential (primary) hypertension: Secondary | ICD-10-CM | POA: Diagnosis not present

## 2024-06-15 DIAGNOSIS — M5416 Radiculopathy, lumbar region: Secondary | ICD-10-CM | POA: Diagnosis not present

## 2024-06-20 ENCOUNTER — Other Ambulatory Visit: Payer: Self-pay | Admitting: Internal Medicine

## 2024-06-20 DIAGNOSIS — Z79899 Other long term (current) drug therapy: Secondary | ICD-10-CM | POA: Diagnosis not present

## 2024-06-21 ENCOUNTER — Other Ambulatory Visit: Payer: Self-pay | Admitting: Internal Medicine

## 2024-06-21 NOTE — Telephone Encounter (Unsigned)
 Copied from CRM [id number]. Topic: Clinical - Medication Refill >> Jun 21, 2024  2:47 PM Viola F wrote: Medication: ALPRAZolam  (XANAX ) 1 MG tablet [[id number]]  Has the patient contacted their pharmacy? Yes (Agent: If no, request that the patient contact the pharmacy for the refill. If patient does not wish to contact the pharmacy document the reason why and proceed with request.) (Agent: If yes, when and what did the pharmacy advise?)  This is the patient's preferred pharmacy:    [facility] [address] [place], [place] - [address] [place] [address] Phone: [phone] Fax: [phone]  Is this the correct pharmacy for this prescription? Yes If no, delete pharmacy and type the correct one.   Has the prescription been filled recently? Yes  Is the patient out of the medication? Yes, completely out since yesterday   Has the patient been seen for an appointment in the last year OR does the patient have an upcoming appointment? Yes  Can we respond through MyChart? Yes  Agent: Please be advised that Rx refills may take up to 3 business days. We ask that you follow-up with your pharmacy.

## 2024-06-22 MED ORDER — ALPRAZOLAM 1 MG PO TABS: 1.0000 mg | ORAL_TABLET | Freq: Three times a day (TID) | ORAL | 1 refills | Status: DC | PRN

## 2024-07-14 DIAGNOSIS — M5416 Radiculopathy, lumbar region: Secondary | ICD-10-CM | POA: Diagnosis not present

## 2024-07-14 DIAGNOSIS — Z9181 History of falling: Secondary | ICD-10-CM | POA: Diagnosis not present

## 2024-07-14 DIAGNOSIS — Z6839 Body mass index (BMI) 39.0-39.9, adult: Secondary | ICD-10-CM | POA: Diagnosis not present

## 2024-07-14 DIAGNOSIS — M48061 Spinal stenosis, lumbar region without neurogenic claudication: Secondary | ICD-10-CM | POA: Diagnosis not present

## 2024-07-14 DIAGNOSIS — E6609 Other obesity due to excess calories: Secondary | ICD-10-CM | POA: Diagnosis not present

## 2024-07-14 DIAGNOSIS — Z79899 Other long term (current) drug therapy: Secondary | ICD-10-CM | POA: Diagnosis not present

## 2024-07-25 ENCOUNTER — Other Ambulatory Visit: Payer: Self-pay | Admitting: Internal Medicine

## 2024-08-04 ENCOUNTER — Ambulatory Visit: Admitting: Internal Medicine

## 2024-08-04 ENCOUNTER — Encounter: Payer: Self-pay | Admitting: Internal Medicine

## 2024-08-04 ENCOUNTER — Ambulatory Visit: Payer: Self-pay | Admitting: Internal Medicine

## 2024-08-04 VITALS — BP 132/84 | HR 68 | Temp 97.8°F | Ht 65.0 in | Wt 233.0 lb

## 2024-08-04 DIAGNOSIS — E538 Deficiency of other specified B group vitamins: Secondary | ICD-10-CM

## 2024-08-04 DIAGNOSIS — E559 Vitamin D deficiency, unspecified: Secondary | ICD-10-CM

## 2024-08-04 DIAGNOSIS — I10 Essential (primary) hypertension: Secondary | ICD-10-CM | POA: Diagnosis not present

## 2024-08-04 DIAGNOSIS — E7800 Pure hypercholesterolemia, unspecified: Secondary | ICD-10-CM

## 2024-08-04 DIAGNOSIS — R739 Hyperglycemia, unspecified: Secondary | ICD-10-CM | POA: Diagnosis not present

## 2024-08-04 DIAGNOSIS — M5432 Sciatica, left side: Secondary | ICD-10-CM | POA: Diagnosis not present

## 2024-08-04 LAB — BASIC METABOLIC PANEL WITH GFR
BUN: 14 mg/dL (ref 6–23)
CO2: 29 meq/L (ref 19–32)
Calcium: 9.4 mg/dL (ref 8.4–10.5)
Chloride: 103 meq/L (ref 96–112)
Creatinine, Ser: 0.88 mg/dL (ref 0.40–1.20)
GFR: 72.05 mL/min (ref >60.00)
Glucose, Bld: 98 mg/dL (ref 70–99)
Potassium: 4.3 meq/L (ref 3.5–5.1)
Sodium: 139 meq/L (ref 135–145)

## 2024-08-04 LAB — HEPATIC FUNCTION PANEL
ALT: 26 U/L (ref 0–35)
AST: 22 U/L (ref 0–37)
Albumin: 4.3 g/dL (ref 3.5–5.2)
Alkaline Phosphatase: 58 U/L (ref 39–117)
Bilirubin, Direct: 0.2 mg/dL (ref 0.0–0.3)
Total Bilirubin: 0.8 mg/dL (ref 0.2–1.2)
Total Protein: 7.2 g/dL (ref 6.0–8.3)

## 2024-08-04 LAB — LIPID PANEL
Cholesterol: 163 mg/dL (ref 0–200)
HDL: 48.1 mg/dL (ref >39.00)
LDL Cholesterol: 91 mg/dL (ref 0–99)
NonHDL: 114.98
Total CHOL/HDL Ratio: 3
Triglycerides: 120 mg/dL (ref 0.0–149.0)
VLDL: 24 mg/dL (ref 0.0–40.0)

## 2024-08-04 LAB — HEMOGLOBIN A1C: Hgb A1c MFr Bld: 5.6 % (ref 4.6–6.5)

## 2024-08-04 MED ORDER — PANTOPRAZOLE SODIUM 40 MG PO TBEC: 40.0000 mg | DELAYED_RELEASE_TABLET | Freq: Every day | ORAL | 3 refills | Status: AC

## 2024-08-04 MED ORDER — PREDNISONE 10 MG PO TABS: ORAL_TABLET | ORAL | 0 refills | Status: AC

## 2024-08-04 MED ORDER — LOSARTAN POTASSIUM 25 MG PO TABS: 25.0000 mg | ORAL_TABLET | Freq: Every day | ORAL | 3 refills | Status: AC

## 2024-08-04 MED ORDER — ALPRAZOLAM 1 MG PO TABS: 1.0000 mg | ORAL_TABLET | Freq: Three times a day (TID) | ORAL | 5 refills | Status: DC | PRN

## 2024-08-04 MED ORDER — KETOROLAC TROMETHAMINE 30 MG/ML IJ SOLN
30.0000 mg | Freq: Once | INTRAMUSCULAR | Status: AC
  Administered 2024-08-04: 30 mg via INTRAMUSCULAR

## 2024-08-04 MED ORDER — PRAVASTATIN SODIUM 40 MG PO TABS: 40.0000 mg | ORAL_TABLET | Freq: Every day | ORAL | 3 refills | Status: AC

## 2024-08-04 NOTE — Patient Instructions (Addendum)
 You had the Toradol  30 mg shot today  Please take all new medication as prescribed - the prednisone  for the prolonged left leg sciatica  Please have your Tdap tetanus shot done at the pharmacy  Please continue all other medications as before, and pain management  Please have the pharmacy call with any other refills you may need.  Please continue your efforts at being more active, low cholesterol diet, and weight control.  You are otherwise up to date with prevention measures today.  You are given the letter as you mentioned  Please keep your appointments with your specialists as you may have planned - pain management  Please go to the LAB at the blood drawing area for the tests to be done  You will be contacted by phone if any changes need to be made immediately.  Otherwise, you will receive a letter about your results with an explanation, but please check with MyChart first.  Please make an Appointment to return in 6 months, or sooner if needed

## 2024-08-04 NOTE — Progress Notes (Signed)
 Patient ID: Pamela Gibson, female   DOB: Aug 07, 1966, 58 y.o.   MRN: [id number]        Chief Complaint: follow up low vit d and b12,  hld, hyperglycemia, htn, left sciatica flare       HPI:  Pamela Gibson is a 58 y.o. female here with c/o 3 wks osnet worsening left sciatica 9/10 with limping gait but no falls;  Pt continues to have recurring LBP without change in severity, bowel or bladder change, fever, wt loss,  worsening LE numbness/weakness, gait change or falls. Cannot sleep at night.  Has chronic narcotic per pain management.  Pt denies chest pain, increased sob or doe, wheezing, orthopnea, PND, increased LE swelling, palpitations, dizziness or syncope.   Pt denies polydipsia, polyuria, or new focal neuro s/s.  For tdap at pharmacy Wt Readings from Last 3 Encounters:  08/04/24 233 lb (105.7 kg)  02/19/24 232 lb (105.2 kg)  02/04/24 229 lb 15 oz (104.3 kg)   BP Readings from Last 3 Encounters:  08/04/24 132/84  02/19/24 122/78  02/04/24 (!) 144/73         Past Medical History:  Diagnosis Date   Anxiety    BACK PAIN 07/23/2007   Barrett's esophagus    CHEST PAIN 09/04/2010   Colon adenoma    Cyst of right ovary 07/03/2015   DEGENERATIVE JOINT DISEASE, LUMBAR SPINE 07/23/2007   Depression    Gastric polyp    x3   GERD 07/22/2007   H/O total hysterectomy    HYPERLIPIDEMIA 07/22/2007   IBS 07/23/2007   MITRAL VALVE PROLAPSE 07/22/2007   Pinched nerve    LEFT SIDE, DISC 3,4,5   Pneumonia    YEARS AGO   SINUSITIS- ACUTE-NOS 09/09/2007   TACHYCARDIA 08/20/2010   Tuberculosis     MOTHER HAD YEARS AGO   Unspecified Vaginitis and Vulvovaginitis 12/17/2007   Past Surgical History:  Procedure Laterality Date   ABDOMINAL HYSTERECTOMY     BIOPSY  04/17/2022   Procedure: BIOPSY;  Surgeon: Federico Rosario BROCKS, MD;  Location: [facility];  Service: Gastroenterology;;   CHOLECYSTECTOMY     COLONOSCOPY WITH PROPOFOL  N/A 04/17/2022   Procedure: COLONOSCOPY WITH PROPOFOL ;  Surgeon:  Federico Rosario BROCKS, MD;  Location: [facility];  Service: Gastroenterology;  Laterality: N/A;   CYSTOSCOPY  11/11/2017   Procedure: CYSTOSCOPY;  Surgeon: Marilynn Nest, DO;  Location: [facility];  Service: Gynecology;;   ESOPHAGOGASTRODUODENOSCOPY (EGD) WITH PROPOFOL  N/A 04/17/2022   Procedure: ESOPHAGOGASTRODUODENOSCOPY (EGD) WITH PROPOFOL ;  Surgeon: Federico Rosario BROCKS, MD;  Location: [facility];  Service: Gastroenterology;  Laterality: N/A;   LAPAROSCOPIC BILATERAL SALPINGO OOPHERECTOMY Bilateral 11/11/2017   Procedure: LAPAROSCOPIC BILATERAL SALPINGO OOPHORECTOMY;  Surgeon: Ozan, Jennifer, DO;  Location: [facility];  Service: Gynecology;  Laterality: Bilateral;   POLYPECTOMY  04/17/2022   Procedure: POLYPECTOMY;  Surgeon: Federico Rosario BROCKS, MD;  Location: [facility];  Service: Gastroenterology;;   RLE tib/fib fx/surgury     TONSILLECTOMY     TUBAL LIGATION      reports that she has never smoked. She has never been exposed to tobacco smoke. She has never used smokeless tobacco. She reports current alcohol use. She reports that she does not use drugs. family history includes Angina in her mother; Colon polyps in her mother; Coronary artery disease in her mother; Diabetes in an other family member; Heart disease in her mother; Hyperlipidemia in an other family member; Hypertension in an other family member; Stroke in her mother. Allergies[1] Medications Ordered  Prior to Encounter[2]      ROS:  All others reviewed and negative.  Objective        PE:  BP 132/84 (BP Location: Right Arm, Patient Position: Sitting, Cuff Size: Normal)   Pulse 68   Temp 97.8 F (36.6 C) (Oral)   Ht 5' 5 (1.651 m)   Wt 233 lb (105.7 kg)   SpO2 98%   BMI 38.77 kg/m                 Constitutional: Pt appears in NAD               HENT: Head: NCAT.                Right Ear: External ear normal.                 Left Ear: External ear normal.                Eyes: . Pupils are equal, round, and reactive to light. Conjunctivae  and EOM are normal               Nose: without d/c or deformity               Neck: Neck supple. Gross normal ROM               Cardiovascular: Normal rate and regular rhythm.                 Pulmonary/Chest: Effort normal and breath sounds without rales or wheezing.                Abd:  Soft, NT, ND, + BS, no organomegaly               Neurological: Pt is alert. At baseline orientation, motor grossly intact               Skin: Skin is warm. No rashes, no other new lesions, LE edema - none               Psychiatric: Pt behavior is normal without agitation   Micro: none  Cardiac tracings I have personally interpreted today:  none  Pertinent Radiological findings (summarize): none   Lab Results  Component Value Date   WBC 7.4 02/19/2024   HGB 15.8 (H) 02/19/2024   HCT 46.7 (H) 02/19/2024   PLT 259.0 02/19/2024   GLUCOSE 98 08/04/2024   CHOL 163 08/04/2024   TRIG 120.0 08/04/2024   HDL 48.10 08/04/2024   LDLDIRECT 128.8 07/18/2008   LDLCALC 91 08/04/2024   ALT 26 08/04/2024   AST 22 08/04/2024   NA 139 08/04/2024   K 4.3 08/04/2024   CL 103 08/04/2024   CREATININE 0.88 08/04/2024   BUN 14 08/04/2024   CO2 29 08/04/2024   TSH 1.65 02/19/2024   HGBA1C 5.6 08/04/2024   Assessment/Plan:  Pamela Gibson is a 58 y.o. American Indian or [place]  Native [3] female with  has a past medical history of Anxiety, BACK PAIN (07/23/2007), Barrett's esophagus, CHEST PAIN (09/04/2010), Colon adenoma, Cyst of right ovary (07/03/2015), DEGENERATIVE JOINT DISEASE, LUMBAR SPINE (07/23/2007), Depression, Gastric polyp, GERD (07/22/2007), H/O total hysterectomy, HYPERLIPIDEMIA (07/22/2007), IBS (07/23/2007), MITRAL VALVE PROLAPSE (07/22/2007), Pinched nerve, Pneumonia, SINUSITIS- ACUTE-NOS (09/09/2007), TACHYCARDIA (08/20/2010), Tuberculosis, and Unspecified Vaginitis and Vulvovaginitis (12/17/2007).  Vitamin D  deficiency Last vitamin D  Lab Results  Component Value Date   VD25OH 53.19 02/19/2024    Stable, cont oral replacement  Vitamin B12 deficiency Lab Results  Component Value Date   VITAMINB12 687 02/19/2024   Stable, cont oral replacement - b12 1000 mcg qd   Left sided sciatica With severe flare x 3 wks, to coninue chronic narcotic no change, but add prednisone  taper, and toradol  30 mg IM now  Hyperlipidemia Lab Results  Component Value Date   LDLCALC 91 08/04/2024   Stable, pt to continue current statin pravachol  40 mg qd   Hyperglycemia Lab Results  Component Value Date   HGBA1C 5.6 08/04/2024   Stable, pt to continue current medical treatment  - diet,wt control   HTN (hypertension) BP Readings from Last 3 Encounters:  08/04/24 132/84  02/19/24 122/78  02/04/24 (!) 144/73   Stable, pt to continue medical treatment losartan  25 mg qd  Followup: Return in about 6 months (around 02/02/2025).  Lynwood Rush, MD 08/06/2024 7:18 PM [facility]     [1]  Allergies Allergen Reactions   Atorvastatin     REACTION: more forgetful   Ibuprofen     INSTRUCTED BY MD NOT TO TAKE DUE TO GI UPSET   Morphine  Sulfate Er Other (See Comments)    Chest hurt   Penicillin G Itching   Penicillins     turns blue, childhood allergy Has patient had a PCN reaction causing immediate rash, facial/tongue/throat swelling, SOB or lightheadedness with hypotension: Yes Has patient had a PCN reaction causing severe rash involving mucus membranes or skin necrosis: No Has patient had a PCN reaction that required hospitalization: Unknown  Has patient had a PCN reaction occurring within the last 10 years: No If all of the above answers are NO, then may proceed with Cephalosporin use.    Simvastatin     REACTION: more forgetful   Tetracycline Itching   Tetracycline Hcl Itching  [2]  Current Outpatient Medications on File Prior to Visit  Medication Sig Dispense Refill   aspirin 81 MG EC tablet Take 81 mg by  mouth every other day.     calcium carbonate (TUMS - DOSED IN MG ELEMENTAL CALCIUM) 500 MG chewable tablet Chew 1-2 tablets by mouth daily as needed for indigestion or heartburn.     CAPVAXIVE 0.5 ML injection      Cholecalciferol (VITAMIN D3) 25 MCG (1000 UT) CAPS Take 2,000 Units by mouth daily.     [employer] PURELAX 17 GM/SCOOP powder Take 0.5 Containers by mouth daily as needed for mild constipation or moderate constipation.     diclofenac  Sodium (VOLTAREN ) 1 % GEL Apply 4 g topically.     Multiple Vitamins-Minerals (MULTIVITAMIN WITH MINERALS) tablet Take 1 tablet by mouth daily.     naloxone  (NARCAN ) nasal spray 4 mg/0.1 mL Use  1 spray every 3 minutes; spray 1 dose into ONE nostril; alternate nostrils w each dose until help arrives 2 each 0   naloxone  (NARCAN ) nasal spray 4 mg/0.1 mL Spray 1 spray every 3 minutes; spray into ONE nostril; alternate nostrils w each dose until help arrives. 2 each 0   naloxone  (NARCAN ) nasal spray 4 mg/0.1 mL Spray 1 dose into ONE nostril every 3 minutes; alternate nostrils with each dose until help arrives 2 each 0   naloxone  (NARCAN ) nasal spray 4 mg/0.1 mL 1 spray every 3 minutes; spray 1 dose into ONE nostril; alternate nostrils w each dose until help arrives 2 each 0   naloxone  (NARCAN ) nasal spray 4 mg/0.1 mL 1 spray every 3 minutes;  spray 1 dose into ONE nostril; alternate nostrils w each dose until help arrives 2 each 0   naloxone  (NARCAN ) nasal spray 4 mg/0.1 mL Instill 1 spray every 3 minutes; spray 1 dose into ONE nostril; alternate nostrils with each dose until help arrives 2 each 0   naloxone  (NARCAN ) nasal spray 4 mg/0.1 mL Spray 1 dose into ONE nostril every 3 minutes; alternate nostrils with each dose until help arrives. 2 each 0   NARCAN  4 MG/0.1ML LIQD nasal spray kit Place 1 spray into the nose once.     nystatin  (MYCOSTATIN /NYSTOP ) powder Apply topically.     Oxycodone  HCl 10 MG TABS Take 1 tablet (10 mg total) by mouth 5 (five) times daily. 150  tablet 0   Oxycodone  HCl 10 MG TABS Take 1 tablet (10 mg total) by mouth 5 (five) times daily. 150 tablet 0   Oxycodone  HCl 10 MG TABS Take 1 tablet (10 mg total) by mouth 5 (five) times daily. 150 tablet 0   Oxycodone  HCl 10 MG TABS Take 1 tablet (10 mg total) by mouth 5 (five) times daily as needed. 150 tablet 0   Oxycodone  HCl 10 MG TABS Take 1 tablet (10 mg total) by mouth 5 (five) times daily. 150 tablet 0   oxyCODONE -acetaminophen  (PERCOCET) 10-325 MG tablet Take 1 tablet by mouth every four hours as needed for pain 180 tablet 0   oxyCODONE -acetaminophen  (PERCOCET) 10-325 MG tablet Take 1 tablet by mouth every 4 hours as needed 180 tablet 0   oxyCODONE -acetaminophen  (PERCOCET) 10-325 MG tablet Take 1 tablet by mouth every 4 hours as needed. 180 tablet 0   oxyCODONE -acetaminophen  (PERCOCET) 10-325 MG tablet Take 1 tablet by mouth five times a day. 150 tablet 0   oxyCODONE -acetaminophen  (PERCOCET) 10-325 MG tablet Take 1 tablet by mouth 5 times daily. 150 tablet 0   oxyCODONE -acetaminophen  (PERCOCET) 10-325 MG tablet Take 1 tablet by mouth 5 (five) times daily. 150 tablet 0   oxyCODONE -acetaminophen  (PERCOCET) 10-325 MG tablet Take 1 tablet by mouth 5 (five) times daily. 150 tablet 0   triamcinolone  cream (KENALOG ) 0.1 % Apply to affected area  2 (two) times daily. 100 g 0   vitamin B-12 (CYANOCOBALAMIN ) 1000 MCG tablet Take 1 tablet (1,000 mcg total) by mouth daily. (Patient taking differently: Take 1,000 mcg by mouth 3 (three) times a week.) 90 tablet 3   No current facility-administered medications on file prior to visit.

## 2024-08-04 NOTE — Progress Notes (Signed)
 The test results show that your current treatment is OK, as the tests are stable.  Please continue the same plan.  There is no other need for change of treatment or further evaluation based on these results, at this time.  thanks

## 2024-08-06 ENCOUNTER — Encounter: Payer: Self-pay | Admitting: Internal Medicine

## 2024-08-06 DIAGNOSIS — M5432 Sciatica, left side: Secondary | ICD-10-CM | POA: Insufficient documentation

## 2024-08-06 NOTE — Assessment & Plan Note (Signed)
 Lab Results  Component Value Date   LDLCALC 91 08/04/2024   Stable, pt to continue current statin pravachol  40 mg qd

## 2024-08-06 NOTE — Assessment & Plan Note (Signed)
 With severe flare x 3 wks, to coninue chronic narcotic no change, but add prednisone  taper, and toradol  30 mg IM now

## 2024-08-06 NOTE — Assessment & Plan Note (Signed)
 Last vitamin D  Lab Results  Component Value Date   VD25OH 53.19 02/19/2024   Stable, cont oral replacement

## 2024-08-06 NOTE — Assessment & Plan Note (Signed)
 Lab Results  Component Value Date   HGBA1C 5.6 08/04/2024   Stable, pt to continue current medical treatment  - diet,wt control

## 2024-08-06 NOTE — Assessment & Plan Note (Signed)
 BP Readings from Last 3 Encounters:  08/04/24 132/84  02/19/24 122/78  02/04/24 (!) 144/73   Stable, pt to continue medical treatment losartan  25 mg qd

## 2024-08-06 NOTE — Assessment & Plan Note (Signed)
 Lab Results  Component Value Date   VITAMINB12 687 02/19/2024   Stable, cont oral replacement - b12 1000 mcg qd

## 2024-08-11 ENCOUNTER — Other Ambulatory Visit (HOSPITAL_COMMUNITY): Payer: Self-pay

## 2024-08-11 ENCOUNTER — Telehealth: Payer: Self-pay | Admitting: Internal Medicine

## 2024-08-11 MED ORDER — ALPRAZOLAM 1 MG PO TABS
1.0000 mg | ORAL_TABLET | Freq: Three times a day (TID) | ORAL | 5 refills | Status: AC | PRN
  Filled 2024-08-11: qty 90, 30d supply, fill #0

## 2024-08-11 NOTE — Telephone Encounter (Signed)
 Copied from CRM #[id number]. Topic: Clinical - Medication Refill >> Aug 11, 2024  8:01 AM [facility] wrote: Medication: ALPRAZolam  (XANAX ) 1 MG tablet, losartan  (COZAAR ) 25 MG tablet, pantoprazole  (PROTONIX ) 40 MG tablet, pravastatin  (PRAVACHOL ) 40 MG tablet  Has the patient contacted their pharmacy? Yes (Agent: If no, request that the patient contact the pharmacy for the refill. If patient does not wish to contact the pharmacy document the reason why and proceed with request.) (Agent: If yes, when and what did the pharmacy advise?)  This is the patient's preferred pharmacy:   [facility] [address] [place], [place] - [address] [place] [address] Phone: [phone] Fax: [phone]  Is this the correct pharmacy for this prescription? Yes If no, delete pharmacy and type the correct one.   Has the prescription been filled recently? Yes  Is the patient out of the medication? No  Has the patient been seen for an appointment in the last year OR does the patient have an upcoming appointment? Yes  Can we respond through MyChart? Yes  Agent: Please be advised that Rx refills may take up to 3 business days. We ask that you follow-up with your pharmacy.

## 2024-08-11 NOTE — Telephone Encounter (Signed)
All done erx 

## 2024-08-12 ENCOUNTER — Other Ambulatory Visit (HOSPITAL_COMMUNITY): Payer: Self-pay

## 2024-08-15 DIAGNOSIS — M48061 Spinal stenosis, lumbar region without neurogenic claudication: Secondary | ICD-10-CM | POA: Diagnosis not present

## 2024-08-15 DIAGNOSIS — E6609 Other obesity due to excess calories: Secondary | ICD-10-CM | POA: Diagnosis not present

## 2024-08-15 DIAGNOSIS — Z79899 Other long term (current) drug therapy: Secondary | ICD-10-CM | POA: Diagnosis not present

## 2024-08-15 DIAGNOSIS — M5416 Radiculopathy, lumbar region: Secondary | ICD-10-CM | POA: Diagnosis not present

## 2024-08-15 DIAGNOSIS — I10 Essential (primary) hypertension: Secondary | ICD-10-CM | POA: Diagnosis not present

## 2024-08-15 DIAGNOSIS — Z9181 History of falling: Secondary | ICD-10-CM | POA: Diagnosis not present

## 2024-08-15 DIAGNOSIS — R7303 Prediabetes: Secondary | ICD-10-CM | POA: Diagnosis not present

## 2024-08-21 ENCOUNTER — Ambulatory Visit
Admission: RE | Admit: 2024-08-21 | Discharge: 2024-08-21 | Disposition: A | Discharge status: Home / Self Care | Payer: Self-pay | Source: Ambulatory Visit | Attending: Physician Assistant | Admitting: Physician Assistant

## 2024-08-21 ENCOUNTER — Other Ambulatory Visit: Payer: Self-pay

## 2024-08-21 VITALS — BP 130/82 | HR 69 | Temp 97.8°F | Resp 18 | Ht 65.0 in | Wt 235.0 lb

## 2024-08-21 DIAGNOSIS — R350 Frequency of micturition: Secondary | ICD-10-CM | POA: Insufficient documentation

## 2024-08-21 DIAGNOSIS — R319 Hematuria, unspecified: Secondary | ICD-10-CM | POA: Diagnosis present

## 2024-08-21 DIAGNOSIS — N390 Urinary tract infection, site not specified: Secondary | ICD-10-CM | POA: Insufficient documentation

## 2024-08-21 LAB — POCT URINE DIPSTICK
Bilirubin, UA: NEGATIVE
Glucose, UA: NEGATIVE mg/dL
Ketones, POC UA: NEGATIVE mg/dL
Nitrite, UA: NEGATIVE
POC PROTEIN,UA: NEGATIVE
Spec Grav, UA: 1.025
Urobilinogen, UA: 1 U/dL
pH, UA: 5.5

## 2024-08-21 MED ORDER — NITROFURANTOIN MONOHYD MACRO 100 MG PO CAPS: 100.0000 mg | ORAL_CAPSULE | Freq: Two times a day (BID) | ORAL | 0 refills | Status: AC

## 2024-08-21 NOTE — Discharge Instructions (Addendum)
 Based on your symptoms and results of the urinalysis I believe you have a UTI I recommend the following:  I have sent in a script for Macrobid 100 mg to be taken by mouth twice per day for 5 days Please finish the entire course of the antibiotic even if you are feeling better before it is completed unless you develop an allergic reaction or are told by a medical provider to stop taking it. We have sent a sample of your urine off for a urine culture.  This will help us  determine what bacteria is causing your symptoms as well as the most appropriate antibiotic to treat it.  If we need to make any adjustments to your medication regimen and new medication will be sent to the pharmacy on file and you will be updated via phone call in MyChart. Stay well hydrated (at least 75 oz of water per day) and avoid holding your urine for prolonged periods of time. If you have any of the following please return to urgent care or go to the emergency room: Persistent symptoms, fever, trouble urinating or inability to urinate, confusion, flank pain.

## 2024-08-21 NOTE — ED Triage Notes (Signed)
 Pt presents with c/o urinary frequency and right lower back pain x 1 week. States she has been applying heat to back + drinking cranberry juice. Currently rates overall pain a 5/10. No medications taken PTA for symptoms reported.

## 2024-08-21 NOTE — ED Provider Notes (Signed)
 [facility]    CSN: [id number] Arrival date & time: 08/21/24  0818      History   Chief Complaint Chief Complaint  Patient presents with   Urinary Frequency    Possible uti - Entered by patient    HPI Pamela Gibson is a 58 y.o. female.   HPI  Pt is here today with concerns for UTI. She states she is having increased urinary frequency and her right side/ lower back is hurting. She reports these symptoms have been ongoing since 08/15/24.,  She denies fever, chills, suprapubic pain, hematuria, vaginal symptoms.   Past Medical History:  Diagnosis Date   Anxiety    BACK PAIN 07/23/2007   Barrett's esophagus    CHEST PAIN 09/04/2010   Colon adenoma    Cyst of right ovary 07/03/2015   DEGENERATIVE JOINT DISEASE, LUMBAR SPINE 07/23/2007   Depression    Gastric polyp    x3   GERD 07/22/2007   H/O total hysterectomy    HYPERLIPIDEMIA 07/22/2007   IBS 07/23/2007   MITRAL VALVE PROLAPSE 07/22/2007   Pinched nerve    LEFT SIDE, DISC 3,4,5   Pneumonia    YEARS AGO   SINUSITIS- ACUTE-NOS 09/09/2007   TACHYCARDIA 08/20/2010   Tuberculosis     MOTHER HAD YEARS AGO   Unspecified Vaginitis and Vulvovaginitis 12/17/2007    Patient Active Problem List   Diagnosis Date Noted   Left sided sciatica 08/06/2024   GAD (generalized anxiety disorder) 12/21/2023   Skin tag 02/19/2023   Headache 09/07/2021   HTN (hypertension) 09/07/2021   Acute cystitis with hematuria 07/17/2021   Bursitis of right hip 06/15/2021   Urinary frequency 06/14/2021   Paresthesias in right hand 04/24/2021   Chronic pain syndrome 02/13/2021   History of hysterectomy 02/13/2021   Menopausal flushing 02/13/2021   Mass of uterine adnexa 02/13/2021   Obesity 02/13/2021   Pain in rectum 02/13/2021   Vasomotor symptoms due to menopause 02/13/2021   Aortic atherosclerosis 02/13/2021   Vitamin B12 deficiency 08/10/2019   Vitamin D  deficiency 08/10/2019   Chest pain 04/12/2019    Hypersomnolence 02/08/2019   Hyperglycemia 02/08/2019   Cloudy urine 08/05/2018   Right flank pain 05/04/2018   Dysuria 05/04/2018   Polycythemia 02/05/2018   Hot flashes 02/05/2018   Microhematuria 01/09/2017   Insomnia 01/09/2017   Disorder of function of stomach 09/06/2016   Arthritis of hand, degenerative 07/03/2016   Cyst of right ovary 07/03/2015   Abnormal urine 12/27/2014   AKI (acute kidney injury) 12/27/2014   Hand pain 06/15/2013   Chronic low back pain 06/15/2013   Lumbar disc disease 06/15/2013   Abnormal liver function test 10/27/2012   Allergic rhinitis 02/13/2012   Vertigo 02/13/2012   Anxiety 12/08/2011   Multiple gastric polyps 12/11/2010   Adenomatous colon polyp 12/11/2010   Encounter for well adult exam with abnormal findings 12/08/2010   IBS 07/23/2007   DEGENERATIVE JOINT DISEASE, LUMBAR SPINE 07/23/2007   Hyperlipidemia 07/22/2007   MITRAL VALVE PROLAPSE 07/22/2007   GERD 07/22/2007   Mitral valve disease 07/22/2007    Past Surgical History:  Procedure Laterality Date   ABDOMINAL HYSTERECTOMY     BIOPSY  04/17/2022   Procedure: BIOPSY;  Surgeon: Federico Rosario BROCKS, MD;  Location: [facility];  Service: Gastroenterology;;   CHOLECYSTECTOMY     COLONOSCOPY WITH PROPOFOL  N/A 04/17/2022   Procedure: COLONOSCOPY WITH PROPOFOL ;  Surgeon: Federico Rosario BROCKS, MD;  Location: [facility];  Service: Gastroenterology;  Laterality: N/A;   CYSTOSCOPY  11/11/2017   Procedure: CYSTOSCOPY;  Surgeon: Marilynn Nest, DO;  Location: [facility];  Service: Gynecology;;   ESOPHAGOGASTRODUODENOSCOPY (EGD) WITH PROPOFOL  N/A 04/17/2022   Procedure: ESOPHAGOGASTRODUODENOSCOPY (EGD) WITH PROPOFOL ;  Surgeon: Federico Rosario BROCKS, MD;  Location: [facility];  Service: Gastroenterology;  Laterality: N/A;   LAPAROSCOPIC BILATERAL SALPINGO OOPHERECTOMY Bilateral 11/11/2017   Procedure: LAPAROSCOPIC BILATERAL SALPINGO OOPHORECTOMY;  Surgeon: Ozan, Jennifer, DO;  Location: [facility];  Service: Gynecology;   Laterality: Bilateral;   POLYPECTOMY  04/17/2022   Procedure: POLYPECTOMY;  Surgeon: Federico Rosario BROCKS, MD;  Location: [facility];  Service: Gastroenterology;;   RLE tib/fib fx/surgury     TONSILLECTOMY     TUBAL LIGATION      OB History   No obstetric history on file.      Home Medications    Prior to Admission medications  Medication Sig Start Date End Date Taking? Authorizing Provider  nitrofurantoin , macrocrystal-monohydrate, (MACROBID ) 100 MG capsule Take 1 capsule (100 mg total) by mouth 2 (two) times daily. 08/21/24  Yes Shetara Launer E, PA-C  ALPRAZolam  (XANAX ) 1 MG tablet Take 1 tablet (1 mg total) by mouth 3 (three) times daily as needed. 08/11/24   Norleen Lynwood ORN, MD  aspirin 81 MG EC tablet Take 81 mg by mouth every other day.    [provider]  calcium carbonate (TUMS - DOSED IN MG ELEMENTAL CALCIUM) 500 MG chewable tablet Chew 1-2 tablets by mouth daily as needed for indigestion or heartburn.    [provider]  CAPVAXIVE 0.5 ML injection  11/10/23   [provider]  Cholecalciferol (VITAMIN D3) 25 MCG (1000 UT) CAPS Take 2,000 Units by mouth daily.    [provider]  [employer] PURELAX 17 GM/SCOOP powder Take 0.5 Containers by mouth daily as needed for mild constipation or moderate constipation. 05/20/20   [provider]  diclofenac  Sodium (VOLTAREN ) 1 % GEL Apply 4 g topically. 07/03/16   [provider]  losartan  (COZAAR ) 25 MG tablet Take 1 tablet (25 mg total) by mouth daily. 08/04/24   Norleen Lynwood ORN, MD  Multiple Vitamins-Minerals (MULTIVITAMIN WITH MINERALS) tablet Take 1 tablet by mouth daily.    [provider]  naloxone  (NARCAN ) nasal spray 4 mg/0.1 mL Use  1 spray every 3 minutes; spray 1 dose into ONE nostril; alternate nostrils w each dose until help arrives 11/25/22     naloxone  (NARCAN ) nasal spray 4 mg/0.1 mL Spray 1 spray every 3 minutes; spray into ONE nostril; alternate nostrils w each dose until help  arrives. 12/23/22     naloxone  (NARCAN ) nasal spray 4 mg/0.1 mL Spray 1 dose into ONE nostril every 3 minutes; alternate nostrils with each dose until help arrives 07/21/23     naloxone  (NARCAN ) nasal spray 4 mg/0.1 mL 1 spray every 3 minutes; spray 1 dose into ONE nostril; alternate nostrils w each dose until help arrives 08/24/23     naloxone  (NARCAN ) nasal spray 4 mg/0.1 mL 1 spray every 3 minutes; spray 1 dose into ONE nostril; alternate nostrils w each dose until help arrives 01/15/24     naloxone  (NARCAN ) nasal spray 4 mg/0.1 mL Instill 1 spray every 3 minutes; spray 1 dose into ONE nostril; alternate nostrils with each dose until help arrives 03/16/24     naloxone  (NARCAN ) nasal spray 4 mg/0.1 mL Spray 1 dose into ONE nostril every 3 minutes; alternate nostrils with each dose until help arrives. 05/17/24  NARCAN  4 MG/0.1ML LIQD nasal spray kit Place 1 spray into the nose once. 10/10/20   [provider]  nystatin  (MYCOSTATIN /NYSTOP ) powder Apply topically. 09/06/16   [provider]  Oxycodone  HCl 10 MG TABS Take 1 tablet (10 mg total) by mouth 5 (five) times daily. 09/18/23     Oxycodone  HCl 10 MG TABS Take 1 tablet (10 mg total) by mouth 5 (five) times daily. 10/20/23     Oxycodone  HCl 10 MG TABS Take 1 tablet (10 mg total) by mouth 5 (five) times daily. 11/16/23     Oxycodone  HCl 10 MG TABS Take 1 tablet (10 mg total) by mouth 5 (five) times daily as needed. 01/15/24     Oxycodone  HCl 10 MG TABS Take 1 tablet (10 mg total) by mouth 5 (five) times daily. 05/17/24     oxyCODONE -acetaminophen  (PERCOCET) 10-325 MG tablet Take 1 tablet by mouth every four hours as needed for pain 04/02/22     oxyCODONE -acetaminophen  (PERCOCET) 10-325 MG tablet Take 1 tablet by mouth every 4 hours as needed 09/25/22     oxyCODONE -acetaminophen  (PERCOCET) 10-325 MG tablet Take 1 tablet by mouth every 4 hours as needed. 01/20/23     oxyCODONE -acetaminophen  (PERCOCET) 10-325 MG tablet Take 1 tablet by mouth  five times a day. 02/20/23     oxyCODONE -acetaminophen  (PERCOCET) 10-325 MG tablet Take 1 tablet by mouth 5 times daily. 03/20/23     oxyCODONE -acetaminophen  (PERCOCET) 10-325 MG tablet Take 1 tablet by mouth 5 (five) times daily. 04/17/23     oxyCODONE -acetaminophen  (PERCOCET) 10-325 MG tablet Take 1 tablet by mouth 5 (five) times daily. 05/14/23     pantoprazole  (PROTONIX ) 40 MG tablet Take 1 tablet (40 mg total) by mouth daily. 08/04/24   Norleen Lynwood ORN, MD  pravastatin  (PRAVACHOL ) 40 MG tablet Take 1 tablet (40 mg total) by mouth daily. 08/04/24   Norleen Lynwood ORN, MD  predniSONE  (DELTASONE ) 10 MG tablet 3 tabs by mouth per day for 3 days,2tabs per day for 3 days,1tab per day for 3 days 08/04/24   Norleen Lynwood ORN, MD  triamcinolone  cream (KENALOG ) 0.1 % Apply to affected area  2 (two) times daily. 09/04/23   Rollene Almarie LABOR, MD  vitamin B-12 (CYANOCOBALAMIN ) 1000 MCG tablet Take 1 tablet (1,000 mcg total) by mouth daily. Patient taking differently: Take 1,000 mcg by mouth 3 (three) times a week. 08/10/19   Norleen Lynwood ORN, MD    Family History Family History  Problem Relation Age of Onset   Colon polyps Mother    Angina Mother    Heart disease Mother        CVA   Coronary artery disease Mother    Stroke Mother    Hypertension Other    Diabetes Other    Hyperlipidemia Other    Breast cancer Neg Hx    Esophageal cancer Neg Hx    Colon cancer Neg Hx    Pancreatic cancer Neg Hx    Stomach cancer Neg Hx    Liver disease Neg Hx     Social History Social History[1]   Allergies   Atorvastatin, Ibuprofen, Morphine  sulfate er, Penicillin g, Penicillins, Simvastatin, Tetracycline, and Tetracycline hcl   Review of Systems Review of Systems  Constitutional:  Negative for chills and fever.  Gastrointestinal:  Negative for abdominal pain.  Genitourinary:  Positive for dysuria, flank pain and frequency. Negative for difficulty urinating, hematuria, vaginal bleeding, vaginal discharge and  vaginal pain.     Physical Exam Triage  Vital Signs ED Triage Vitals  Encounter Vitals Group     BP 08/21/24 0832 130/82     Girls Systolic BP Percentile --      Girls Diastolic BP Percentile --      Boys Systolic BP Percentile --      Boys Diastolic BP Percentile --      Pulse Rate 08/21/24 0832 69     Resp 08/21/24 0832 18     Temp 08/21/24 0832 97.8 F (36.6 C)     Temp Source 08/21/24 0832 Oral     SpO2 08/21/24 0832 95 %     Weight 08/21/24 0832 235 lb (106.6 kg)     Height 08/21/24 0832 5' 5 (1.651 m)     Head Circumference --      Peak Flow --      Pain Score 08/21/24 0842 5     Pain Loc --      Pain Education --      Exclude from Growth Chart --    No data found.  Updated Vital Signs BP 130/82 (BP Location: Right Arm)   Pulse 69   Temp 97.8 F (36.6 C) (Oral)   Resp 18   Ht 5' 5 (1.651 m)   Wt 235 lb (106.6 kg)   SpO2 95%   BMI 39.11 kg/m   Visual Acuity Right Eye Distance:   Left Eye Distance:   Bilateral Distance:    Right Eye Near:   Left Eye Near:    Bilateral Near:     Physical Exam Vitals reviewed.  Constitutional:      General: She is awake.     Appearance: Normal appearance. She is well-developed and well-groomed.  HENT:     Head: Normocephalic and atraumatic.  Eyes:     General: Lids are normal. Gaze aligned appropriately.     Extraocular Movements: Extraocular movements intact.     Conjunctiva/sclera: Conjunctivae normal.  Pulmonary:     Effort: Pulmonary effort is normal.  Abdominal:     General: There is no distension.     Palpations: Abdomen is soft. There is no mass.     Tenderness: There is no abdominal tenderness. There is no right CVA tenderness or left CVA tenderness.  Neurological:     Mental Status: She is alert and oriented to person, place, and time.  Psychiatric:        Attention and Perception: Attention and perception normal.        Mood and Affect: Mood and affect normal.        Speech: Speech normal.         Behavior: Behavior normal. Behavior is cooperative.      UC Treatments / Results  Labs (all labs ordered are listed, but only abnormal results are displayed) Labs Reviewed  POCT URINE DIPSTICK - Abnormal; Notable for the following components:      Result Value   Color, UA straw (*)    Clarity, UA cloudy (*)    Blood, UA moderate (*)    Leukocytes, UA Small (1+) (*)    All other components within normal limits  URINE CULTURE    EKG   Radiology No results found.  Procedures Procedures (including critical care time)  Medications Ordered in UC Medications - No data to display  Initial Impression / Assessment and Plan / UC Course  I have reviewed the triage vital signs and the nursing notes.  Pertinent labs & imaging results that were available during  my care of the patient were reviewed by me and considered in my medical decision making (see chart for details).      Final Clinical Impressions(s) / UC Diagnoses   Final diagnoses:  Urinary frequency  Urinary tract infection with hematuria, site unspecified   Patient reports symptoms comprised of the following: dysuria,increased urinary frequency, flank pain  since 08/15/24 Results of UA are consistent with UTI - urine sample sent for culture to determine causative organism and susceptibility- results to dictate further management  Recommend starting Macrobid  100 mg PO BID x 5 days  Will provide script - discussed importance of finishing entire course of abx and staying well hydrated while recovering from UTI Reviewed ED precautions with patient Follow up as needed for persistent or worsening symptoms    Discharge Instructions      Based on your symptoms and results of the urinalysis I believe you have a UTI I recommend the following:  I have sent in a script for Macrobid  100 mg to be taken by mouth twice per day for 5 days  Please finish the entire course of the antibiotic even if you are feeling better before it  is completed unless you develop an allergic reaction or are told by a medical provider to stop taking it. We have sent a sample of your urine off for a urine culture.  This will help us  determine what bacteria is causing your symptoms as well as the most appropriate antibiotic to treat it.  If we need to make any adjustments to your medication regimen and new medication will be sent to the pharmacy on file and you will be updated via phone call in MyChart. Stay well hydrated (at least 75 oz of water  per day) and avoid holding your urine for prolonged periods of time. If you have any of the following please return to urgent care or go to the emergency room: Persistent symptoms, fever, trouble urinating or inability to urinate, confusion, flank pain.       ED Prescriptions     Medication Sig Dispense Auth. Provider   nitrofurantoin , macrocrystal-monohydrate, (MACROBID ) 100 MG capsule Take 1 capsule (100 mg total) by mouth 2 (two) times daily. 10 capsule Ruhan Borak E, PA-C      PDMP not reviewed this encounter.    [1]  Social History Tobacco Use   Smoking status: Never    Passive exposure: Never   Smokeless tobacco: Never  Vaping Use   Vaping status: Never Used  Substance Use Topics   Alcohol use: Yes    Comment: occasional   Drug use: No     Marylene Rocky BRAVO, PA-C 08/21/24 [id number]  "

## 2024-08-23 ENCOUNTER — Ambulatory Visit (HOSPITAL_COMMUNITY): Payer: Self-pay

## 2024-08-23 LAB — URINE CULTURE: Culture: 80000 — AB

## 2024-09-13 ENCOUNTER — Encounter (INDEPENDENT_AMBULATORY_CARE_PROVIDER_SITE_OTHER): Admitting: Ophthalmology

## 2024-09-13 DIAGNOSIS — H43813 Vitreous degeneration, bilateral: Secondary | ICD-10-CM | POA: Diagnosis not present

## 2024-09-13 DIAGNOSIS — H34811 Central retinal vein occlusion, right eye, with macular edema: Secondary | ICD-10-CM | POA: Diagnosis not present

## 2024-09-13 DIAGNOSIS — H35033 Hypertensive retinopathy, bilateral: Secondary | ICD-10-CM | POA: Diagnosis not present

## 2024-09-13 DIAGNOSIS — I10 Essential (primary) hypertension: Secondary | ICD-10-CM | POA: Diagnosis not present

## 2024-12-16 ENCOUNTER — Ambulatory Visit: Admitting: Nurse Practitioner

## 2025-03-13 ENCOUNTER — Encounter (INDEPENDENT_AMBULATORY_CARE_PROVIDER_SITE_OTHER): Admitting: Ophthalmology
